# Patient Record
Sex: Female | Born: 1985 | Hispanic: Yes | Marital: Married | Smoking: Never smoker
Health system: Southern US, Community
[De-identification: ages and names within clinical notes are randomized; demographics above are authoritative.]

## PROBLEM LIST (undated history)

## (undated) DIAGNOSIS — R55 Syncope and collapse: Secondary | ICD-10-CM

## (undated) DIAGNOSIS — O24419 Gestational diabetes mellitus in pregnancy, unspecified control: Secondary | ICD-10-CM

## (undated) DIAGNOSIS — E119 Type 2 diabetes mellitus without complications: Secondary | ICD-10-CM

## (undated) DIAGNOSIS — R030 Elevated blood-pressure reading, without diagnosis of hypertension: Secondary | ICD-10-CM

## (undated) DIAGNOSIS — I10 Essential (primary) hypertension: Secondary | ICD-10-CM

## (undated) DIAGNOSIS — J302 Other seasonal allergic rhinitis: Secondary | ICD-10-CM

## (undated) DIAGNOSIS — M069 Rheumatoid arthritis, unspecified: Secondary | ICD-10-CM

## (undated) DIAGNOSIS — R7303 Prediabetes: Secondary | ICD-10-CM

## (undated) DIAGNOSIS — IMO0001 Reserved for inherently not codable concepts without codable children: Secondary | ICD-10-CM

## (undated) HISTORY — PX: TOOTH EXTRACTION: SUR596

## (undated) HISTORY — DX: Reserved for inherently not codable concepts without codable children: IMO0001

## (undated) HISTORY — DX: Elevated blood-pressure reading, without diagnosis of hypertension: R03.0

## (undated) HISTORY — PX: CYSTECTOMY: SUR359

## (undated) HISTORY — DX: Rheumatoid arthritis, unspecified: M06.9

## (undated) HISTORY — DX: Prediabetes: R73.03

---

## 2009-10-22 ENCOUNTER — Emergency Department (HOSPITAL_COMMUNITY): Admission: EM | Admit: 2009-10-22 | Discharge: 2009-10-22 | Payer: Self-pay | Admitting: Emergency Medicine

## 2010-01-18 ENCOUNTER — Emergency Department (HOSPITAL_COMMUNITY): Admission: EM | Admit: 2010-01-18 | Discharge: 2010-01-18 | Payer: Self-pay | Admitting: Emergency Medicine

## 2010-09-14 NOTE — L&D Delivery Note (Signed)
Cesarean Section Procedure Note  Indications: failure to progress: arrest of descent  Pre-operative Diagnosis: 38 week 5 day pregnancy.  Post-operative Diagnosis: same  Surgeon: Oliver Pila   Anesthesia: Epidural anesthesia   Procedure Details   The patient concurred with the proposed plan, giving informed consent.  The site of surgery properly noted/marked. The patient was taken to Operating Room #1, identified as Kelly Gregory and the procedure verified as C-Section Delivery. A Time Out was held and the above information confirmed.  After induction of anesthesia, the patient was draped and prepped in the usual sterile manner. A Pfannenstiel incision was made and carried down through the subcutaneous tissue to the fascia. Fascial incision was made and extended transversely. The fascia was separated from the underlying rectus tissue superiorly and inferiorly. The peritoneum was identified and entered. Peritoneal incision was extended longitudinally. The utero-vesical peritoneal reflection was incised transversely and the bladder flap was bluntly freed from the lower uterine segment. A low transverse uterine incision was made and the cavity entered bluntly. Delivered from OP/vtx presentation was a 7lb 7oz female infant with Apgar scores of 8 at one minute and 9 at five minutes. After the umbilical cord was clamped and cut cord blood was obtained for evaluation. The placenta was removed intact and appeared normal. The uterine outline, tubes and ovaries appeared normal. The uterine incision was closed with running locked sutures of 1-0 chromicl. A second layer of the same suture was performed in an imbricating fashion. Hemostasis was observed. Lavage was carried out until clear. The fascia was then reapproximated with running sutures of 0 Vicryl. The subcutaneous tissue was reapproximated with 3-0 plain in a running fashion.The skin was reapproximated with a subcuticular stitich of 4-0 vicryl on a  Keith needle..  Instrument, sponge, and needle counts were correct prior the abdominal closure and at the conclusion of the case.   Findings: Viable female Apgars 8,9 weight 7#7oz Normal uterus tubes and ovaries  Estimated Blood Loss:  700cc         Drains  foley         Total IV Fluids:         Specimens:Placenta                Complications:  None; patient tolerated the procedure well.         Disposition: PACU - hemodynamically stable.         Condition: stable  Attending Attestation: I was present and scrubbed for the entire procedure.

## 2010-10-06 LAB — TYPE AND SCREEN: Antibody Screen: NEGATIVE

## 2010-10-06 LAB — RPR: RPR: NONREACTIVE

## 2010-10-06 LAB — ABO/RH: RH Type: POSITIVE

## 2010-10-06 LAB — HEPATITIS B SURFACE ANTIGEN: Hepatitis B Surface Ag: NEGATIVE

## 2011-01-21 LAB — RPR: RPR: NONREACTIVE

## 2011-02-11 ENCOUNTER — Encounter: Payer: BC Managed Care – PPO | Attending: Obstetrics and Gynecology

## 2011-02-11 DIAGNOSIS — O99810 Abnormal glucose complicating pregnancy: Secondary | ICD-10-CM | POA: Insufficient documentation

## 2011-02-11 DIAGNOSIS — Z713 Dietary counseling and surveillance: Secondary | ICD-10-CM | POA: Insufficient documentation

## 2011-03-31 ENCOUNTER — Inpatient Hospital Stay (HOSPITAL_COMMUNITY): Payer: BC Managed Care – PPO | Admitting: Anesthesiology

## 2011-03-31 ENCOUNTER — Encounter (HOSPITAL_COMMUNITY): Payer: Self-pay | Admitting: Anesthesiology

## 2011-03-31 ENCOUNTER — Inpatient Hospital Stay (HOSPITAL_COMMUNITY)
Admission: AD | Admit: 2011-03-31 | Discharge: 2011-04-03 | DRG: 651 | Disposition: A | Discharge status: Home / Self Care | Payer: BC Managed Care – PPO | Source: Ambulatory Visit | Attending: Obstetrics and Gynecology | Admitting: Obstetrics and Gynecology

## 2011-03-31 ENCOUNTER — Encounter (HOSPITAL_COMMUNITY): Payer: Self-pay

## 2011-03-31 DIAGNOSIS — O139 Gestational [pregnancy-induced] hypertension without significant proteinuria, unspecified trimester: Principal | ICD-10-CM | POA: Diagnosis present

## 2011-03-31 DIAGNOSIS — O324XX0 Maternal care for high head at term, not applicable or unspecified: Secondary | ICD-10-CM | POA: Diagnosis not present

## 2011-03-31 HISTORY — DX: Gestational diabetes mellitus in pregnancy, unspecified control: O24.419

## 2011-03-31 HISTORY — DX: Syncope and collapse: R55

## 2011-03-31 LAB — CBC
Hemoglobin: 12.2 g/dL (ref 12.0–15.0)
MCH: 26 pg (ref 26.0–34.0)
MCHC: 33.1 g/dL (ref 30.0–36.0)
MCV: 78.5 fL (ref 78.0–100.0)
Platelets: 236 10*3/uL (ref 150–400)
RBC: 4.7 MIL/uL (ref 3.87–5.11)

## 2011-03-31 LAB — LACTATE DEHYDROGENASE: LDH: 166 U/L (ref 94–250)

## 2011-03-31 LAB — COMPREHENSIVE METABOLIC PANEL
ALT: 15 U/L (ref 0–35)
Alkaline Phosphatase: 347 U/L — ABNORMAL HIGH (ref 39–117)
CO2: 18 mEq/L — ABNORMAL LOW (ref 19–32)
Calcium: 8.4 mg/dL (ref 8.4–10.5)
Glucose, Bld: 93 mg/dL (ref 70–99)
Potassium: 3.9 mEq/L (ref 3.5–5.1)
Sodium: 134 mEq/L — ABNORMAL LOW (ref 135–145)
Total Bilirubin: 0.1 mg/dL — ABNORMAL LOW (ref 0.3–1.2)

## 2011-03-31 LAB — URINALYSIS, ROUTINE W REFLEX MICROSCOPIC
Bilirubin Urine: NEGATIVE
Leukocytes, UA: NEGATIVE
Nitrite: NEGATIVE
Protein, ur: 30 mg/dL — AB
Specific Gravity, Urine: 1.015 (ref 1.005–1.030)
Urobilinogen, UA: 0.2 mg/dL (ref 0.0–1.0)

## 2011-03-31 LAB — RPR: RPR Ser Ql: NONREACTIVE

## 2011-03-31 LAB — URINE MICROSCOPIC-ADD ON

## 2011-03-31 LAB — URIC ACID: Uric Acid, Serum: 4.2 mg/dL (ref 2.4–7.0)

## 2011-03-31 MED ORDER — LACTATED RINGERS IV SOLN
500.0000 mL | INTRAVENOUS | Status: DC | PRN
  Administered 2011-03-31: 1000 mL via INTRAVENOUS

## 2011-03-31 MED ORDER — IBUPROFEN 600 MG PO TABS: 600.0000 mg | ORAL_TABLET | Freq: Four times a day (QID) | ORAL | Status: DC | PRN

## 2011-03-31 MED ORDER — OXYTOCIN 20 UNITS IN LACTATED RINGERS INFUSION - SIMPLE: 125.0000 mL/h | Freq: Once | INTRAVENOUS | Status: DC

## 2011-03-31 MED ORDER — LIDOCAINE HCL (PF) 1 % IJ SOLN
30.0000 mL | INTRAMUSCULAR | Status: DC | PRN
  Filled 2011-03-31: qty 30

## 2011-03-31 MED ORDER — LACTATED RINGERS IV SOLN
INTRAVENOUS | Status: DC
  Administered 2011-03-31 – 2011-04-01 (×2): via INTRAVENOUS

## 2011-03-31 MED ORDER — EPHEDRINE 5 MG/ML INJ
10.0000 mg | INTRAVENOUS | Status: DC | PRN
  Filled 2011-03-31 (×2): qty 4

## 2011-03-31 MED ORDER — PHENYLEPHRINE 40 MCG/ML (10ML) SYRINGE FOR IV PUSH (FOR BLOOD PRESSURE SUPPORT): 80.0000 ug | PREFILLED_SYRINGE | INTRAVENOUS | Status: DC | PRN

## 2011-03-31 MED ORDER — LACTATED RINGERS IV SOLN
INTRAVENOUS | Status: DC
  Administered 2011-03-31 – 2011-04-01 (×5): via INTRAVENOUS

## 2011-03-31 MED ORDER — NALBUPHINE HCL 10 MG/ML IJ SOLN: 10.0000 mg | Freq: Four times a day (QID) | INTRAMUSCULAR | Status: DC | PRN

## 2011-03-31 MED ORDER — PHENYLEPHRINE 40 MCG/ML (10ML) SYRINGE FOR IV PUSH (FOR BLOOD PRESSURE SUPPORT)
80.0000 ug | PREFILLED_SYRINGE | INTRAVENOUS | Status: DC | PRN
  Filled 2011-03-31 (×2): qty 5

## 2011-03-31 MED ORDER — OXYTOCIN 20 UNITS IN LACTATED RINGERS INFUSION - SIMPLE: 1.0000 m[IU]/min | INTRAVENOUS | Status: DC

## 2011-03-31 MED ORDER — TERBUTALINE SULFATE 1 MG/ML IJ SOLN: 0.2500 mg | Freq: Once | INTRAMUSCULAR | Status: AC | PRN

## 2011-03-31 MED ORDER — FENTANYL 2.5 MCG/ML BUPIVACAINE 1/10 % EPIDURAL INFUSION (WH - ANES)
INTRAMUSCULAR | Status: DC | PRN
  Administered 2011-03-31: 14 mL/h via EPIDURAL

## 2011-03-31 MED ORDER — OXYTOCIN 20 UNITS IN LACTATED RINGERS INFUSION - SIMPLE
1.0000 m[IU]/min | INTRAVENOUS | Status: DC
  Administered 2011-03-31: 1 m[IU]/min via INTRAVENOUS
  Filled 2011-03-31: qty 1000

## 2011-03-31 MED ORDER — EPHEDRINE 5 MG/ML INJ: 10.0000 mg | INTRAVENOUS | Status: DC | PRN

## 2011-03-31 MED ORDER — DIPHENHYDRAMINE HCL 50 MG/ML IJ SOLN: 12.5000 mg | INTRAMUSCULAR | Status: DC | PRN

## 2011-03-31 MED ORDER — OXYCODONE-ACETAMINOPHEN 5-325 MG PO TABS: 2.0000 | ORAL_TABLET | ORAL | Status: DC | PRN

## 2011-03-31 MED ORDER — FENTANYL 2.5 MCG/ML BUPIVACAINE 1/10 % EPIDURAL INFUSION (WH - ANES)
14.0000 mL/h | INTRAMUSCULAR | Status: DC
  Administered 2011-04-01: 14 mL/h via EPIDURAL
  Filled 2011-03-31 (×3): qty 60

## 2011-03-31 MED ORDER — ONDANSETRON HCL 4 MG/2ML IJ SOLN: 4.0000 mg | Freq: Four times a day (QID) | INTRAMUSCULAR | Status: DC | PRN

## 2011-03-31 MED ORDER — LACTATED RINGERS IV SOLN
500.0000 mL | Freq: Once | INTRAVENOUS | Status: AC
  Administered 2011-04-01: 1000 mL via INTRAVENOUS

## 2011-03-31 MED ORDER — ACETAMINOPHEN 325 MG PO TABS: 650.0000 mg | ORAL_TABLET | ORAL | Status: DC | PRN

## 2011-03-31 MED ORDER — FLEET ENEMA 7-19 GM/118ML RE ENEM: 1.0000 | ENEMA | RECTAL | Status: DC | PRN

## 2011-03-31 MED ORDER — CITRIC ACID-SODIUM CITRATE 334-500 MG/5ML PO SOLN
30.0000 mL | ORAL | Status: DC | PRN
  Administered 2011-04-01: 30 mL via ORAL
  Filled 2011-03-31: qty 30

## 2011-03-31 NOTE — Progress Notes (Signed)
Gwenn Teodoro is a 25 y.o. G1P0 at [redacted]w[redacted]d with increased BP and latent phase labor Subjective:   Objective: BP 140/81  Pulse 85  Temp(Src) 97.1 F (36.2 C) (Axillary)  Resp 20  Ht 5' 5.5" (1.664 m)  Wt 82.101 kg (181 lb)  BMI 29.66 kg/m2  SpO2 97%      FHT:  Reassuring, good variabililty and some accels UC:   irregular, every 5-7 minutes SVE: 70/5/-2 AROM slightly bloody fluid Labs: Assessment / Plan: Protracted latent phase  Labor:  AROM performed and will increase pitocin as needed Preeclampsia:  Pt with increased BP but labs WNL, no proteinuria, and no sx Fetal Wellbeing:  Category I Pain Control:  Plans epidural Jayme Mednick W 03/31/2011, 7:11 PM

## 2011-03-31 NOTE — H&P (Signed)
Kelly Gregory is a 25 y.o. female G2P0010 at 39 4/7 wks  EDD 04/10/11 by LMP c/w early [place]  presenting for elevated BP and latent phase labor.  Pt seen in office with  BP noted to be 150/90-100 and ctx q 5 mins, no LOF--cervix 50/3-4/-2.  Sent to MAU/L&D for admission and probable augmentation of labor.  Prenatal care significant for GDM. BS well-controlled on glyburide 2.5mg  po q hs.  NST's since 32 weeks, twice weekly have been reactive.  She denies HA, or other PIH symptoms and has no proteinuria. Maternal Medical History:  Reason for admission: Reason for admission: contractions.    OB History    Grav Para Term Preterm Abortions TAB SAB Ect Mult Living   1              Past Medical History  Diagnosis Date  . Syncope   . Arthritis   . Gestational diabetes    Past Surgical History  Procedure Date  . Tooth extraction   . Cystectomy    Family History iin prenatal Social History:  reports that she has never smoked. She does not have any smokeless tobacco history on file. She reports that she does not drink alcohol or use illicit drugs.  ROS negative    Blood pressure 140/103, pulse 97, temperature 97.2 F (36.2 C), temperature source Oral, resp. rate 20, height 5' 5.5" (1.664 m), weight 82.101 kg (181 lb), SpO2 97.00%. Exam CV RRR  Lungs CTA bilaterally Abd gravid NT Cervix now 60/4-5/-2 BBOW Physical Exam  Prenatal labs: ABO, Rh:  A positive Antibody:  negative  Rubella:  Immune  RPR:   NR HBsAg:   Neg HIV:   NR GBS:   negative First Trimester Screen and AFP WNL One hour and 3 hour both abnormal  Assessment/Plan: Pt in MAU waiting on L&D bed.  Will check PIH labs and confirm WNL, but suspect PIH not preeclampsia. Pt making slow cervical change, when gets L&D bed, plan AROM and pitocin augmentation. BP still 140/100-150/100, will follow closely.   Oliver Pila 03/31/2011, 1:42 PM

## 2011-03-31 NOTE — Anesthesia Preprocedure Evaluation (Signed)
Anesthesia Evaluation  Name, MR# and DOB Patient awake  General Assessment Comment  Reviewed: Allergy & Precautions, H&P , Patient's Chart, lab work & pertinent test results and reviewed documented beta blocker date and time   History of Anesthesia Complications (+) MALIGNANT HYPERTHERMIA  Airway Mallampati: I TM Distance: >3 FB Neck ROM: full    Dental  (+) Teeth Intact   Pulmonaryneg pulmonary ROS    clear to auscultation    Cardiovascular regular Normal   Neuro/PsychNegative Neurological ROS Negative Psych ROS  GI/Hepatic/Renal negative GI ROS, negative Liver ROS, and negative Renal ROS (+)       Endo/Other  Negative Endocrine ROS (+)   Abdominal   Musculoskeletal  Hematology negative hematology ROS (+)   Peds  Reproductive/Obstetrics (+) Pregnancy   Anesthesia Other Findings             Anesthesia Physical Anesthesia Plan  ASA: II  Anesthesia Plan: Epidural   Post-op Pain Management:    Induction:   Airway Management Planned:   Additional Equipment:   Intra-op Plan:   Post-operative Plan:   Informed Consent: I have reviewed the patients History and Physical, chart, labs and discussed the procedure including the risks, benefits and alternatives for the proposed anesthesia with the patient or authorized representative who has indicated his/her understanding and acceptance.     Plan Discussed with:   Anesthesia Plan Comments:         Anesthesia Quick Evaluation

## 2011-03-31 NOTE — Anesthesia Procedure Notes (Addendum)
Epidural Patient location during procedure: OB Start time: 03/31/2011 8:47 PM End time: 03/31/2011 8:59 PM  Staffing Anesthesiologist: Sandrea Hughs  Preanesthetic Checklist Completed: patient identified, site marked, surgical consent, pre-op evaluation, timeout performed, IV checked, risks and benefits discussed and monitors and equipment checked  Epidural Patient position: sitting Prep: DuraPrep Patient monitoring: continuous pulse ox and blood pressure Approach: midline Injection technique: LOR air  Needle:  Needle type: Tuohy  Needle gauge: 17 G Needle length: 9 cm Needle insertion depth: 6 cm Catheter type: closed end flexible Catheter size: 19 Gauge Catheter at skin depth: 10 and 11 cm Test dose: negative and 1.5% lidocaine  Assessment Sensory level: T7 Events: blood not aspirated, injection not painful, no injection resistance, negative IV test and no paresthesia  Additional Notes Pts legs warm. See nursing notes for VS and FHR

## 2011-03-31 NOTE — Progress Notes (Signed)
   Subjective: Pt comfortable with epidural.   Objective: BP 126/76  Pulse 93  Temp(Src) 97.1 F (36.2 C) (Axillary)  Resp 20  Ht 5' 5.5" (1.664 m)  Wt 82.101 kg (181 lb)  BMI 29.66 kg/m2  SpO2 97%      FHT:  FHR: 145 bpm, variability: moderate,  accelerations:  Present,  decelerations:  Absent UC:   regular, every 5 minutes SVE:   60 /5+ /-1  IUPC placed to follow ctx closely and adjust pitocin Labs: Lab Results  Component Value Date   WBC 8.7 03/31/2011   HGB 12.2 03/31/2011   HCT 36.9 03/31/2011   MCV 78.5 03/31/2011   PLT 236 03/31/2011    Assessment / Plan: Augmentation of labor, progressing well  Labor: Will increase pitocin to achieve adequate labor Preeclampsia:  c/w PIH but not preeclampsia Fetal Wellbeing:  Category I Pain Control:  Epidural Shariq Puig W 03/31/2011, 9:36 PM

## 2011-03-31 NOTE — Progress Notes (Signed)
Pt was seen in the office today for evaluation of contractions. Pt had elevated BP and sent for direct admit. Was 3.5 cm in the office. Having contractions q 5-6 minutes.

## 2011-04-01 ENCOUNTER — Encounter (HOSPITAL_COMMUNITY)
Admission: AD | Disposition: A | Discharge status: Home / Self Care | Payer: Self-pay | Source: Ambulatory Visit | Attending: Obstetrics and Gynecology

## 2011-04-01 ENCOUNTER — Encounter (HOSPITAL_COMMUNITY): Payer: Self-pay | Admitting: Anesthesiology

## 2011-04-01 ENCOUNTER — Encounter (HOSPITAL_COMMUNITY): Payer: Self-pay | Admitting: *Deleted

## 2011-04-01 SURGERY — Surgical Case
Anesthesia: Regional

## 2011-04-01 MED ORDER — SENNOSIDES-DOCUSATE SODIUM 8.6-50 MG PO TABS
1.0000 | ORAL_TABLET | Freq: Every day | ORAL | Status: DC
  Administered 2011-04-01: 2 via ORAL
  Administered 2011-04-02: 1 via ORAL

## 2011-04-01 MED ORDER — WITCH HAZEL-GLYCERIN EX PADS: MEDICATED_PAD | CUTANEOUS | Status: DC | PRN

## 2011-04-01 MED ORDER — KETOROLAC TROMETHAMINE 30 MG/ML IJ SOLN: 30.0000 mg | Freq: Four times a day (QID) | INTRAMUSCULAR | Status: AC | PRN

## 2011-04-01 MED ORDER — MEPERIDINE HCL 25 MG/ML IJ SOLN
6.2500 mg | INTRAMUSCULAR | Status: DC | PRN
  Administered 2011-04-01: 12.5 mg via INTRAVENOUS

## 2011-04-01 MED ORDER — GENTAMICIN IN SALINE 1-0.9 MG/ML-% IV SOLN: 100.0000 mg | Freq: Once | INTRAVENOUS | Status: DC

## 2011-04-01 MED ORDER — DIPHENHYDRAMINE HCL 50 MG/ML IJ SOLN: 12.5000 mg | INTRAMUSCULAR | Status: DC | PRN

## 2011-04-01 MED ORDER — SODIUM CHLORIDE 0.9 % IJ SOLN: 3.0000 mL | INTRAMUSCULAR | Status: DC | PRN

## 2011-04-01 MED ORDER — KETOROLAC TROMETHAMINE 60 MG/2ML IM SOLN
60.0000 mg | Freq: Once | INTRAMUSCULAR | Status: AC
  Administered 2011-04-01: 60 mg via INTRAMUSCULAR

## 2011-04-01 MED ORDER — GENTAMICIN IN SALINE 1.6-0.9 MG/ML-% IV SOLN
INTRAVENOUS | Status: DC | PRN
  Administered 2011-04-01: 100 mg via INTRAVENOUS

## 2011-04-01 MED ORDER — KETOROLAC TROMETHAMINE 30 MG/ML IJ SOLN: 15.0000 mg | Freq: Once | INTRAMUSCULAR | Status: DC | PRN

## 2011-04-01 MED ORDER — PRENATAL PLUS 27-1 MG PO TABS
1.0000 | ORAL_TABLET | Freq: Every day | ORAL | Status: DC
  Administered 2011-04-01 – 2011-04-02 (×2): 1 via ORAL
  Filled 2011-04-01 (×2): qty 1

## 2011-04-01 MED ORDER — METOCLOPRAMIDE HCL 5 MG/ML IJ SOLN
INTRAMUSCULAR | Status: AC
  Administered 2011-04-01: 10 mg via INTRAVENOUS
  Filled 2011-04-01: qty 2

## 2011-04-01 MED ORDER — DIPHENHYDRAMINE HCL 50 MG/ML IJ SOLN: 25.0000 mg | INTRAMUSCULAR | Status: DC | PRN

## 2011-04-01 MED ORDER — SCOPOLAMINE 1 MG/3DAYS TD PT72
MEDICATED_PATCH | TRANSDERMAL | Status: AC
  Administered 2011-04-01: 1.5 mg via TRANSDERMAL
  Filled 2011-04-01: qty 1

## 2011-04-01 MED ORDER — IBUPROFEN 600 MG PO TABS
600.0000 mg | ORAL_TABLET | Freq: Four times a day (QID) | ORAL | Status: DC
  Administered 2011-04-01 – 2011-04-03 (×8): 600 mg via ORAL
  Filled 2011-04-01 (×8): qty 1

## 2011-04-01 MED ORDER — OXYTOCIN 20 UNITS IN LACTATED RINGERS INFUSION - SIMPLE
125.0000 mL/h | INTRAVENOUS | Status: AC
Start: 2011-04-01 — End: 2011-04-01

## 2011-04-01 MED ORDER — HYDROMORPHONE HCL 1 MG/ML IJ SOLN: 0.2500 mg | INTRAMUSCULAR | Status: DC | PRN

## 2011-04-01 MED ORDER — ONDANSETRON HCL 4 MG/2ML IJ SOLN
INTRAMUSCULAR | Status: DC | PRN
  Administered 2011-04-01: 4 mg via INTRAVENOUS

## 2011-04-01 MED ORDER — SIMETHICONE 80 MG PO CHEW: 80.0000 mg | CHEWABLE_TABLET | ORAL | Status: DC | PRN

## 2011-04-01 MED ORDER — PHENYLEPHRINE 40 MCG/ML (10ML) SYRINGE FOR IV PUSH (FOR BLOOD PRESSURE SUPPORT)
PREFILLED_SYRINGE | INTRAVENOUS | Status: AC
  Filled 2011-04-01: qty 5

## 2011-04-01 MED ORDER — MEPERIDINE HCL 25 MG/ML IJ SOLN
INTRAMUSCULAR | Status: AC
  Administered 2011-04-01: 12.5 mg via INTRAVENOUS
  Filled 2011-04-01: qty 1

## 2011-04-01 MED ORDER — ONDANSETRON HCL 4 MG/2ML IJ SOLN
INTRAMUSCULAR | Status: AC
  Filled 2011-04-01: qty 2

## 2011-04-01 MED ORDER — NALOXONE HCL 0.4 MG/ML IJ SOLN: 0.4000 mg | INTRAMUSCULAR | Status: DC | PRN

## 2011-04-01 MED ORDER — NALBUPHINE HCL 10 MG/ML IJ SOLN
5.0000 mg | INTRAMUSCULAR | Status: AC | PRN
  Filled 2011-04-01: qty 1

## 2011-04-01 MED ORDER — OXYTOCIN 10 UNIT/ML IJ SOLN
INTRAMUSCULAR | Status: AC
  Filled 2011-04-01: qty 4

## 2011-04-01 MED ORDER — PHENYLEPHRINE HCL 10 MG/ML IJ SOLN
INTRAMUSCULAR | Status: DC | PRN
  Administered 2011-04-01 (×3): 80 ug via INTRAVENOUS

## 2011-04-01 MED ORDER — OXYTOCIN 10 UNIT/ML IJ SOLN
INTRAMUSCULAR | Status: DC | PRN
  Administered 2011-04-01 (×2): 20 [IU] via INTRAMUSCULAR

## 2011-04-01 MED ORDER — DIPHENHYDRAMINE HCL 25 MG PO CAPS: 25.0000 mg | ORAL_CAPSULE | Freq: Four times a day (QID) | ORAL | Status: DC | PRN

## 2011-04-01 MED ORDER — SCOPOLAMINE 1 MG/3DAYS TD PT72
1.0000 | MEDICATED_PATCH | TRANSDERMAL | Status: DC
  Administered 2011-04-01: 1.5 mg via TRANSDERMAL

## 2011-04-01 MED ORDER — TETANUS-DIPHTH-ACELL PERTUSSIS 5-2.5-18.5 LF-MCG/0.5 IM SUSP
0.5000 mL | Freq: Once | INTRAMUSCULAR | Status: AC
  Administered 2011-04-02: 0.5 mL via INTRAMUSCULAR
  Filled 2011-04-01: qty 0.5

## 2011-04-01 MED ORDER — SODIUM CHLORIDE 0.9 % IV SOLN
1.0000 ug/kg/h | INTRAVENOUS | Status: DC | PRN
  Filled 2011-04-01: qty 2.5

## 2011-04-01 MED ORDER — GENTAMICIN SULFATE 40 MG/ML IJ SOLN
Freq: Once | INTRAMUSCULAR | Status: DC
  Filled 2011-04-01: qty 2.5

## 2011-04-01 MED ORDER — ONDANSETRON HCL 4 MG/2ML IJ SOLN
4.0000 mg | INTRAMUSCULAR | Status: DC | PRN
  Administered 2011-04-01: 4 mg via INTRAVENOUS
  Filled 2011-04-01: qty 2

## 2011-04-01 MED ORDER — CLINDAMYCIN PHOSPHATE 600 MG/50ML IV SOLN
INTRAVENOUS | Status: DC | PRN
  Administered 2011-04-01: 900 mg via INTRAVENOUS

## 2011-04-01 MED ORDER — ONDANSETRON HCL 4 MG PO TABS: 4.0000 mg | ORAL_TABLET | ORAL | Status: DC | PRN

## 2011-04-01 MED ORDER — LIDOCAINE-EPINEPHRINE (PF) 2 %-1:200000 IJ SOLN
INTRAMUSCULAR | Status: AC
  Filled 2011-04-01: qty 20

## 2011-04-01 MED ORDER — METOCLOPRAMIDE HCL 5 MG/ML IJ SOLN
10.0000 mg | Freq: Once | INTRAMUSCULAR | Status: AC | PRN
  Administered 2011-04-01: 10 mg via INTRAVENOUS

## 2011-04-01 MED ORDER — OXYCODONE-ACETAMINOPHEN 5-325 MG PO TABS
1.0000 | ORAL_TABLET | ORAL | Status: DC | PRN
  Administered 2011-04-02 – 2011-04-03 (×2): 1 via ORAL
  Filled 2011-04-01 (×2): qty 1

## 2011-04-01 MED ORDER — SIMETHICONE 80 MG PO CHEW
80.0000 mg | CHEWABLE_TABLET | Freq: Three times a day (TID) | ORAL | Status: DC
  Administered 2011-04-01 – 2011-04-02 (×7): 80 mg via ORAL

## 2011-04-01 MED ORDER — ONDANSETRON HCL 4 MG/2ML IJ SOLN: 4.0000 mg | Freq: Once | INTRAMUSCULAR | Status: DC | PRN

## 2011-04-01 MED ORDER — IBUPROFEN 200 MG PO TABS: 200.0000 mg | ORAL_TABLET | Freq: Four times a day (QID) | ORAL | Status: DC | PRN

## 2011-04-01 MED ORDER — CLINDAMYCIN PHOSPHATE 900 MG/50ML IV SOLN: 900.0000 mg | Freq: Once | INTRAVENOUS | Status: DC

## 2011-04-01 MED ORDER — MORPHINE SULFATE 0.5 MG/ML IJ SOLN
INTRAMUSCULAR | Status: AC
  Filled 2011-04-01: qty 10

## 2011-04-01 MED ORDER — ZOLPIDEM TARTRATE 5 MG PO TABS: 5.0000 mg | ORAL_TABLET | Freq: Every evening | ORAL | Status: DC | PRN

## 2011-04-01 MED ORDER — DIPHENHYDRAMINE HCL 25 MG PO CAPS: 25.0000 mg | ORAL_CAPSULE | ORAL | Status: DC | PRN

## 2011-04-01 MED ORDER — KETOROLAC TROMETHAMINE 60 MG/2ML IM SOLN
60.0000 mg | Freq: Once | INTRAMUSCULAR | Status: AC | PRN
  Filled 2011-04-01: qty 2

## 2011-04-01 MED ORDER — MORPHINE SULFATE (PF) 0.5 MG/ML IJ SOLN
INTRAMUSCULAR | Status: DC | PRN
  Administered 2011-04-01: 1 mg via INTRAVENOUS
  Administered 2011-04-01: 4 mg via EPIDURAL

## 2011-04-01 MED ORDER — KETOROLAC TROMETHAMINE 60 MG/2ML IM SOLN
INTRAMUSCULAR | Status: AC
  Administered 2011-04-01: 60 mg via INTRAMUSCULAR
  Filled 2011-04-01: qty 2

## 2011-04-01 MED ORDER — LIDOCAINE-EPINEPHRINE (PF) 2 %-1:200000 IJ SOLN
INTRAMUSCULAR | Status: DC | PRN
  Administered 2011-04-01: 3 mL via INTRADERMAL
  Administered 2011-04-01 (×2): 5 mL via INTRADERMAL

## 2011-04-01 MED ORDER — SODIUM BICARBONATE 8.4 % IV SOLN
INTRAVENOUS | Status: AC
  Filled 2011-04-01: qty 50

## 2011-04-01 MED ORDER — MENTHOL 3 MG MT LOZG: 1.0000 | LOZENGE | OROMUCOSAL | Status: DC | PRN

## 2011-04-01 SURGICAL SUPPLY — 29 items
CHLORAPREP W/TINT 26ML (MISCELLANEOUS) ×2 IMPLANT
CLOTH BEACON ORANGE TIMEOUT ST (SAFETY) ×2 IMPLANT
CONTAINER PREFILL 10% NBF 15ML (MISCELLANEOUS) IMPLANT
DRAPE UTILITY XL STRL (DRAPES) ×2 IMPLANT
ELECT REM PT RETURN 9FT ADLT (ELECTROSURGICAL) ×2
ELECTRODE REM PT RTRN 9FT ADLT (ELECTROSURGICAL) ×1 IMPLANT
EXTRACTOR VACUUM KIWI (MISCELLANEOUS) IMPLANT
EXTRACTOR VACUUM M CUP 4 TUBE (SUCTIONS) IMPLANT
GLOVE BIO SURGEON STRL SZ 6.5 (GLOVE) ×4 IMPLANT
GLOVE BIO SURGEON STRL SZ8 (GLOVE) ×2 IMPLANT
GOWN BRE IMP SLV AUR LG STRL (GOWN DISPOSABLE) ×4 IMPLANT
KIT ABG SYR 3ML LUER SLIP (SYRINGE) IMPLANT
NEEDLE HYPO 25X5/8 SAFETYGLIDE (NEEDLE) IMPLANT
NS IRRIG 1000ML POUR BTL (IV SOLUTION) ×2 IMPLANT
PACK C SECTION WH (CUSTOM PROCEDURE TRAY) ×2 IMPLANT
RTRCTR C-SECT PINK 25CM LRG (MISCELLANEOUS) ×2 IMPLANT
SLEEVE SCD COMPRESS KNEE MED (MISCELLANEOUS) ×2 IMPLANT
STAPLER VISISTAT 35W (STAPLE) IMPLANT
SUT CHROMIC 1 CTX 36 (SUTURE) ×4 IMPLANT
SUT PLAIN 0 NONE (SUTURE) IMPLANT
SUT PLAIN 2 0 XLH (SUTURE) IMPLANT
SUT VIC AB 0 CT1 27 (SUTURE) ×2
SUT VIC AB 0 CT1 27XBRD ANBCTR (SUTURE) ×2 IMPLANT
SUT VIC AB 2-0 CT1 27 (SUTURE)
SUT VIC AB 2-0 CT1 TAPERPNT 27 (SUTURE) IMPLANT
SUT VIC AB 4-0 KS 27 (SUTURE) IMPLANT
TOWEL OR 17X24 6PK STRL BLUE (TOWEL DISPOSABLE) ×4 IMPLANT
TRAY FOLEY CATH 14FR (SET/KITS/TRAYS/PACK) IMPLANT
WATER STERILE IRR 1000ML POUR (IV SOLUTION) ×2 IMPLANT

## 2011-04-01 NOTE — Progress Notes (Signed)
Baby was just born this morning. Mom reports that he nursed well in recovery room for 15 minutes, but has been sleepy since. Reviewed awakening techniques, No further questions at present. To page for assist prn.

## 2011-04-01 NOTE — Progress Notes (Signed)
UR chart review completed.  

## 2011-04-01 NOTE — Transfer of Care (Signed)
Immediate Anesthesia Transfer of Care Note  Patient: Kelly Gregory  Procedure(s) Performed:  CESAREAN SECTION  Patient Location: PACU  Anesthesia Type: Epidural  Level of Consciousness: awake, oriented and sedated  Airway & Oxygen Therapy: Patient Spontanous Breathing  Post-op Assessment: Report given to PACU RN and Post -op Vital signs reviewed and stable  Post vital signs: Reviewed and stable  Complications: No apparent anesthesia complications

## 2011-04-01 NOTE — Progress Notes (Signed)
   Subjective: Pt still comfortable with epidural  Objective: BP 128/84  Pulse 91  Temp(Src) 98.6 F (37 C) (Axillary)  Resp 19  Ht 5' 5.5" (1.664 m)  Wt 82.101 kg (181 lb)  BMI 29.66 kg/m2  SpO2 97%      FHT:  Great variability, decels with ctx to 100 from baseline of 130.  Pt did have a continuous decel for about 3-4 minutes to 90-100 when had back-to-back ctx, so pitocin turned off UC:   Regular every 1-2 mins SVE:  C/ 9+/ 0  Direct OP Labs: Lab Results  Component Value Date   WBC 8.7 03/31/2011   HGB 12.2 03/31/2011   HCT 36.9 03/31/2011   MCV 78.5 03/31/2011   PLT 236 03/31/2011    Assessment / Plan: Pt now at essentially 10cm, has reducible ant lip.  Station at 0, so will try to labor down a bit.  Labor: Progressing normally to 10cm, will have to see if will descend now given OP presentation.  Fetal Wellbeing:  Category II--overall reassuring with mild decels with ctx. Pain Control:  Epidural   IRICHARDSON,Daimion Adamcik W 04/01/2011, 1:30 AM

## 2011-04-01 NOTE — Progress Notes (Signed)
   Subjective: Pt still comfortable  Objective: BP 153/96  Pulse 92  Temp(Src) 98.6 F (37 C) (Axillary)  Resp 19  Ht 5' 5.5" (1.664 m)  Wt 82.101 kg (181 lb)  BMI 29.66 kg/m2  SpO2 97%      FHT: overall good variability with some decels with contractions, mild UC:   q SVE:   Dilation: 10 Effacement (%): 100 Station: -1;0 Exam by:: Dr. Senaida Ores  Labs: Lab Results  Component Value Date   WBC 8.7 03/31/2011   HGB 12.2 03/31/2011   HCT 36.9 03/31/2011   MCV 78.5 03/31/2011   PLT 236 03/31/2011    Assessment / Plan: Arrest of decent  Labor: Pt labored down about 1+ hour then attempted pushing for 30 mins with no real progress noted.  Baby remains OP and -1 to 0 station.  Options of continuing to push vs proceeding with c-section d/w pt and husband and they desire c-section.  Risks and benefits of the surgery reviewed in detail and she desires to proceed.  OR notified and prepping.  Fetal Wellbeing:  Category II Pain Control:  Epidural Jurline Folger W 04/01/2011, 2:57 AM

## 2011-04-01 NOTE — Brief Op Note (Signed)
03/31/2011 - 04/01/2011  4:22 AM  PATIENT:  Kelly Gregory  25 y.o. female  PRE-OPERATIVE DIAGNOSIS:  Term Pregnancy at 9 5/7wks                                 Failure To Descend  POST-OPERATIVE DIAGNOSIS:  Same with OP presentation  Findings :Female At 0337                    Apgar 9/9, weight 7lbs 7oz  Nml uterus tubes and ovaries  PROCEDURE:  Procedure(s): Primary Low transverse CESAREAN SECTION  SURGEON:  Surgeon(s): Oliver Pila  ANESTHESIA:   epidural  ESTIMATED BLOOD LOSS: 700cc BLOOD ADMINISTERED:none  DRAINS: none   LOCAL MEDICATIONS USED:  NONE  SPECIMEN:  Placenta DISPOSITION OF SPECIMEN: L&D COUNTS:  YES  PLAN OF CARE: routine postop care PATIENT DISPOSITION:  PACU - hemodynamically stable.

## 2011-04-01 NOTE — Progress Notes (Signed)
Subjective: Postpartum Day 0: Cesarean Delivery Patient reports tolerating PO.  Pain is controlled.  Objective: Vital signs in last 24 hours: Temp:  [97.1 F (36.2 C)-98.7 F (37.1 C)] 97.9 F (36.6 C) (07/18 0730) Pulse Rate:  [73-121] 99  (07/18 0730) Resp:  [16-22] 18  (07/18 0730) BP: (119-161)/(69-107) 134/91 mmHg (07/18 0730) SpO2:  [93 %-98 %] 93 % (07/18 0600) Weight:  [82.101 kg (181 lb)] 181 lb (82.101 kg) (07/17 1214)  Physical Exam:  General: alert Lochia: appropriate Uterine Fundus: firm Incision: clean dry and intact DVT Evaluation: No evidence of DVT seen on physical exam. UOP adequate  Basename 03/31/11 1332  HGB 12.2  HCT 36.9    Assessment/Plan: Status post Cesarean section. Doing well postoperatively.  Continue current care.  Oliver Pila 04/01/2011, 8:45 AM

## 2011-04-01 NOTE — Consult Note (Signed)
Asked to attend delivery of this infant by C/S at term for FTD. Infant was vigorous at birth. Dried. Apgars 9/9 To CN. Care to assigned Ped. Edson Snowball, MD

## 2011-04-01 NOTE — Anesthesia Postprocedure Evaluation (Signed)
Vital signs stable Patient alert Pain and nausea are controlled No apparent anesthetic complications No follow up care needed NM function returning normally

## 2011-04-02 LAB — CBC
Hemoglobin: 10.3 g/dL — ABNORMAL LOW (ref 12.0–15.0)
RBC: 4.06 MIL/uL (ref 3.87–5.11)
WBC: 12.1 10*3/uL — ABNORMAL HIGH (ref 4.0–10.5)

## 2011-04-02 NOTE — Progress Notes (Signed)
Subjective: Postpartum Day 1: Cesarean Delivery Patient reports tolerating PO, + flatus and no problems voiding, ambulating well  Objective: Vital signs in last 24 hours: Temp:  [98.1 F (36.7 C)-99.2 F (37.3 C)] 98.5 F (36.9 C) (07/19 0609) Pulse Rate:  [82-114] 82  (07/19 0609) Resp:  [16-20] 16  (07/19 0609) BP: (103-135)/(66-88) 120/81 mmHg (07/19 0609) SpO2:  [89 %-95 %] 93 % (07/19 0300)  Physical Exam:  General: alert Lochia: appropriate Uterine Fundus: firm Incision: no significant drainage DVT Evaluation: No evidence of DVT seen on physical exam.   Basename 04/02/11 0550 03/31/11 1332  HGB 10.3* 12.2  HCT 32.3* 36.9    Assessment/Plan: Status post Cesarean section. Doing well postoperatively. BP WBL.  Continue current care.  Kelly Gregory 04/02/2011, 9:18 AM

## 2011-04-02 NOTE — Progress Notes (Signed)
Mom HP prior to latch.  Hand expressions obs. Colostrum.  Waking techniques used; few attempts needed to latch.  Once latched maintained latch and remained in good rhythm with audible swallows.  Wide open mouth, flanged lips.  Basic BF teaching done.  Parents verbalized understanding.

## 2011-04-03 MED ORDER — IBUPROFEN 600 MG PO TABS: 600.0000 mg | ORAL_TABLET | Freq: Four times a day (QID) | ORAL | Status: AC

## 2011-04-03 MED ORDER — OXYCODONE-ACETAMINOPHEN 5-325 MG PO TABS: 1.0000 | ORAL_TABLET | ORAL | Status: AC | PRN

## 2011-04-03 NOTE — Progress Notes (Signed)
Mother states infant cluster fed during the night. Reviewed pre pumping and growth spurts. Parents has lots of questions. discussed phone line , outpatient visit and support group for more assistance.

## 2011-04-03 NOTE — Progress Notes (Signed)
Subjective: Postpartum Day2: Cesarean Delivery Patient reports tolerating PO, + flatus and no problems voiding.    Objective: Vital signs in last 24 hours: Temp:  [97.7 F (36.5 C)-98.5 F (36.9 C)] 97.7 F (36.5 C) (07/20 0620) Pulse Rate:  [81-99] 81  (07/20 0620) Resp:  [18] 18  (07/20 0620) BP: (122-133)/(86-90) 124/87 mmHg (07/20 1610)  Physical Exam:  General: alert Lochia: appropriate Uterine Fundus: firm Incision: healing well, steristrips fell off in shower    Basename 04/02/11 0550 03/31/11 1332  HGB 10.3* 12.2  HCT 32.3* 36.9    Assessment/Plan: Status post Cesarean section. Doing well postoperatively.  Discharge home with standard precautions and return to office in 2 weeks for incision check.  Kelly Gregory 04/03/2011, 9:37 AM

## 2011-04-03 NOTE — Discharge Summary (Signed)
Obstetric Discharge Summary Reason for Admission: induction of labor due to [facility] Prenatal Procedures: NST Intrapartum Procedures: cesarean: low cervical, transverse Postpartum Procedures: none Complications-Operative and Postpartum: none  Hemoglobin  Date Value Range Status  04/02/2011 10.3* 12.0-15.0 (g/dL) Final     HCT  Date Value Range Status  04/02/2011 32.3* 36.0-46.0 (%) Final    Discharge Diagnoses: Term Pregnancy-delivered                                         LTCS for arrest of Descent Discharge Information: Date: 04/03/2011 Activity: pelvic rest Diet: routine Medications: Ibuprophen and Percocet Condition: stable Instructions: refer to practice specific booklet Discharge to: home  Pt admitted due to [facility] for induction of labor.  Preeclampsia labs were normal and there was no proteinuria. Made good progress on pitocin to 9-10 cm, but vertex would not descend beyond a 0 station even with attempted pushing when cervix was reducible.  Baby was OP.  Pt then underwent a LTCS without complication and was  Admitted for post-op care.  Course was uneventful, baby did well and BP were stable at 130-140/80-90. Follow-up Information    Follow up with Gianina Olinde W in 2 weeks. (Call for appt)    Contact information:   [address] [place] [address] [phone]          Newborn Data: Live born  Information for the patient's newborn:  Abir, Eroh Verlia [[id number]]  female ; APGAR8,9 ; weight ; 7#7oz Home with mother.  Oliver Pila 04/03/2011, 9:42 AM

## 2011-04-07 ENCOUNTER — Encounter (HOSPITAL_COMMUNITY): Payer: BC Managed Care – PPO

## 2011-04-16 ENCOUNTER — Encounter (HOSPITAL_COMMUNITY): Payer: Self-pay | Admitting: Obstetrics and Gynecology

## 2012-05-25 ENCOUNTER — Emergency Department (INDEPENDENT_AMBULATORY_CARE_PROVIDER_SITE_OTHER)
Admission: EM | Admit: 2012-05-25 | Discharge: 2012-05-25 | Disposition: A | Discharge status: Home / Self Care | Payer: BC Managed Care – PPO | Source: Home / Self Care | Attending: Emergency Medicine | Admitting: Emergency Medicine

## 2012-05-25 ENCOUNTER — Encounter (HOSPITAL_COMMUNITY): Payer: Self-pay | Admitting: *Deleted

## 2012-05-25 DIAGNOSIS — S61209A Unspecified open wound of unspecified finger without damage to nail, initial encounter: Secondary | ICD-10-CM

## 2012-05-25 DIAGNOSIS — L0390 Cellulitis, unspecified: Secondary | ICD-10-CM

## 2012-05-25 DIAGNOSIS — Z23 Encounter for immunization: Secondary | ICD-10-CM

## 2012-05-25 DIAGNOSIS — IMO0001 Reserved for inherently not codable concepts without codable children: Secondary | ICD-10-CM

## 2012-05-25 DIAGNOSIS — T148XXA Other injury of unspecified body region, initial encounter: Secondary | ICD-10-CM

## 2012-05-25 DIAGNOSIS — S61259A Open bite of unspecified finger without damage to nail, initial encounter: Secondary | ICD-10-CM

## 2012-05-25 MED ORDER — TETANUS-DIPHTHERIA TOXOIDS TD 5-2 LFU IM INJ: 0.5000 mL | INJECTION | Freq: Once | INTRAMUSCULAR | Status: DC

## 2012-05-25 MED ORDER — CLINDAMYCIN HCL 300 MG PO CAPS: 300.0000 mg | ORAL_CAPSULE | Freq: Four times a day (QID) | ORAL | Status: AC

## 2012-05-25 MED ORDER — TETANUS-DIPHTH-ACELL PERTUSSIS 5-2.5-18.5 LF-MCG/0.5 IM SUSP
0.5000 mL | Freq: Once | INTRAMUSCULAR | Status: AC
  Administered 2012-05-25: 0.5 mL via INTRAMUSCULAR

## 2012-05-25 MED ORDER — TETANUS-DIPHTH-ACELL PERTUSSIS 5-2.5-18.5 LF-MCG/0.5 IM SUSP
INTRAMUSCULAR | Status: AC
  Filled 2012-05-25: qty 0.5

## 2012-05-25 NOTE — ED Provider Notes (Signed)
History     CSN: [id number]  Arrival date & time 05/25/12  1836   None     Chief Complaint  Patient presents with  . Animal Bite    (Consider location/radiation/quality/duration/timing/severity/associated sxs/prior treatment) The history is provided by the patient.   Kelly Gregory is a 26 y.o. female who sustained a left middle finger injury one day ago. Mechanism of injury: cat bite while trying to place in cat carrier. Immediate symptoms: immediate pain and bleeding. Symptoms have been persistent since that time. No prior history of related problems.  Pt report her personal cat, immunizations are up to date.  Past Medical History  Diagnosis Date  . Syncope   . Arthritis   . Gestational diabetes     Past Surgical History  Procedure Date  . Tooth extraction   . Cystectomy   . Cesarean section 04/01/2011    Procedure: CESAREAN SECTION;  Surgeon: Oliver Pila;  Location: [facility] ORS;  Service: Gynecology;  Laterality: N/A;    No family history on file.  History  Substance Use Topics  . Smoking status: Never Smoker   . Smokeless tobacco: Not on file  . Alcohol Use: No    OB History    Grav Para Term Preterm Abortions TAB SAB Ect Mult Living   1               Review of Systems  Constitutional: Negative.   Respiratory: Negative.   Cardiovascular: Negative.   Skin: Positive for wound. Negative for color change.    Allergies  Azithromycin; Tequin; and Penicillins  Home Medications   Current Outpatient Rx  Name Route Sig Dispense Refill  . CLINDAMYCIN HCL 300 MG PO CAPS Oral Take 1 capsule (300 mg total) by mouth 4 (four) times daily. 28 capsule 0  . FOLIC ACID PO Oral Take 1 tablet by mouth daily. Unknown strength      BP 150/93  Pulse 70  Temp 98.7 F (37.1 C) (Oral)  Resp 18  SpO2 96%  LMP 04/25/2012  Physical Exam  Nursing note and vitals reviewed. Constitutional: She is oriented to person, place, and time. Vital signs are normal. She appears  well-developed and well-nourished. She is active and cooperative.  HENT:  Head: Normocephalic.  Eyes: Conjunctivae normal are normal. Pupils are equal, round, and reactive to light. No scleral icterus.  Neck: Trachea normal. Neck supple.  Cardiovascular: Normal rate and regular rhythm.   Pulmonary/Chest: Effort normal and breath sounds normal.  Neurological: She is alert and oriented to person, place, and time. No cranial nerve deficit or sensory deficit.  Skin: Skin is warm and dry.       2 puncture wounds to left middle medial distal finger, erythema and swelling noted, no drainage, hemostatic  Psychiatric: She has a normal mood and affect. Her speech is normal and behavior is normal. Judgment and thought content normal. Cognition and memory are normal.    ED Course  Procedures (including critical care time)  Labs Reviewed - No data to display No results found.   1. Cat bite of finger   2. Cellulitis       MDM  Patient started on clindamycin only related to amoxicillin and tequin allergy.  Wound recheck in 2 days to ensure improvement.          Johnsie Kindred, NP 05/25/12 2147

## 2012-05-25 NOTE — ED Notes (Signed)
Pt  Reports  She  Sustained  A  Cat  Bite  From  Her  House  Cat  yest     Which  Has  Been  Vaccinated    -  The  Bite  Was  Provoked        2  Small  Puncture  wounds  Are  Present

## 2012-05-27 ENCOUNTER — Encounter (HOSPITAL_COMMUNITY): Payer: Self-pay | Admitting: Emergency Medicine

## 2012-05-27 ENCOUNTER — Emergency Department (HOSPITAL_COMMUNITY)
Admission: EM | Admit: 2012-05-27 | Discharge: 2012-05-27 | Disposition: A | Discharge status: Home / Self Care | Payer: BC Managed Care – PPO | Source: Home / Self Care

## 2012-05-27 DIAGNOSIS — IMO0001 Reserved for inherently not codable concepts without codable children: Secondary | ICD-10-CM

## 2012-05-27 DIAGNOSIS — L0390 Cellulitis, unspecified: Secondary | ICD-10-CM

## 2012-05-27 NOTE — ED Notes (Signed)
Pt is here for a f/u for a cat bite on her left hand... Was here on Wednesday for the same reason... States that her cat, who is vaccinated, bit her when she was trying to place her in the "cat carrier"... Pt says the pain has gotten better but is concerned about the puss that's building up on her finger... Finger is a little tender but denies fever, vomiting, nausea and diarrhea.... Has not finished her antibiotics yet.

## 2012-05-27 NOTE — ED Provider Notes (Signed)
History     CSN: [id number]  Arrival date & time 05/27/12  5284   None     Chief Complaint  Patient presents with  . Animal Bite    (Consider location/radiation/quality/duration/timing/severity/associated sxs/prior treatment) HPI Comments: Wound chk of cat bite to L middle finger. Pt st is looking much better, redness and swelling much reduced and feelingbetter. A small 4mm superficial pustule remains on middle phalynx.   Patient is a 26 y.o. female presenting with animal bite.  Animal Bite     Past Medical History  Diagnosis Date  . Syncope   . Arthritis   . Gestational diabetes     Past Surgical History  Procedure Date  . Tooth extraction   . Cystectomy   . Cesarean section 04/01/2011    Procedure: CESAREAN SECTION;  Surgeon: Oliver Pila;  Location: [facility];  Service: Gynecology;  Laterality: N/A;    History reviewed. No pertinent family history.  History  Substance Use Topics  . Smoking status: Never Smoker   . Smokeless tobacco: Not on file  . Alcohol Use: No    OB History    Grav Para Term Preterm Abortions TAB SAB Ect Mult Living   1               Review of Systems  Constitutional: Negative.   Musculoskeletal: Negative.   Skin:       See HPI  Neurological: Negative.     Allergies  Azithromycin; Tequin; and Penicillins  Home Medications   Current Outpatient Rx  Name Route Sig Dispense Refill  . CLINDAMYCIN HCL 300 MG PO CAPS Oral Take 1 capsule (300 mg total) by mouth 4 (four) times daily. 28 capsule 0  . FOLIC ACID PO Oral Take 1 tablet by mouth daily. Unknown strength      BP 139/99  Pulse 89  Temp 98.6 F (37 C) (Oral)  Resp 16  SpO2 98%  LMP 04/25/2012  Physical Exam  Constitutional: She is oriented to person, place, and time. She appears well-developed and well-nourished.  Musculoskeletal: Normal range of motion.  Neurological: She is alert and oriented to person, place, and time.  Skin: Skin is warm and dry.   Mild, improved erythema of the middle digit, Minor swelling and a 4mm pustule of middle phalynx. No circumferential redness or swelling  Psychiatric: She has a normal mood and affect.    ED Course  INCISION AND DRAINAGE Date/Time: 05/27/2012 10:28 AM Performed by: Phineas Real, Markiesha Delia Authorized by: Phineas Real, Torii Royse Consent: Verbal consent obtained. Written consent not obtained. Risks and benefits: risks, benefits and alternatives were discussed Consent given by: patient Patient identity confirmed: verbally with patient Type: abscess Body area: upper extremity Location details: left long finger Patient sedated: no Incision type: single straight Complexity: simple Drainage: purulent Drainage amount: scant Comments: The small and very superficial pustule involving the dermis only was punctured at a 30 deg angle and a depth of 3mm, using #11 blade. The purulence was expressed equalling approx 4 large drops .    (including critical care time)  Labs Reviewed - No data to display No results found.   1. Cellulitis   2. Wound check, dressing change       MDM  Cont Clindamycin Pustule I and D. Warm salt water soaks.         Hayden Rasmussen, NP 05/27/12 1035

## 2012-05-28 NOTE — ED Provider Notes (Signed)
Medical screening examination/treatment/procedure(s) were performed by resident physician or non-physician practitioner and as supervising physician I was immediately available for consultation/collaboration.   Barkley Bruns MD.    Linna Hoff, MD 05/28/12 1047

## 2012-05-30 NOTE — ED Provider Notes (Signed)
Medical screening examination/treatment/procedure(s) were performed by non-physician practitioner and as supervising physician I was immediately available for consultation/collaboration.  Kawika Bischoff M. MD   Sal Spratley M Kynzee Devinney, MD 05/30/12 0806 

## 2013-01-04 ENCOUNTER — Encounter: Payer: Self-pay | Admitting: *Deleted

## 2013-01-04 ENCOUNTER — Encounter: Payer: BC Managed Care – PPO | Attending: Obstetrics and Gynecology | Admitting: *Deleted

## 2013-01-04 VITALS — Ht <= 58 in | Wt 168.0 lb

## 2013-01-04 DIAGNOSIS — Z713 Dietary counseling and surveillance: Secondary | ICD-10-CM | POA: Insufficient documentation

## 2013-01-04 DIAGNOSIS — O99810 Abnormal glucose complicating pregnancy: Secondary | ICD-10-CM | POA: Insufficient documentation

## 2013-01-04 NOTE — Progress Notes (Signed)
  Patient was seen on 01/04/13 for Gestational Diabetes self-management class at the [facility]. The following learning objectives were met by the patient during this course:   States the definition of Gestational Diabetes  States why dietary management is important in controlling blood glucose  Describes the effects each nutrient has on blood glucose levels  Demonstrates ability to create a balanced meal plan  Demonstrates carbohydrate counting   States when to check blood glucose levels  Demonstrates proper blood glucose monitoring techniques  States the effect of stress and exercise on blood glucose levels  States the importance of limiting caffeine and abstaining from alcohol and smoking  Blood glucose monitor given:  One Touch Ultra Mini Self Monitoring Kit Lot # [id number] Exp: 4/15 Blood glucose reading: 103 mg/dl  Patient instructed to monitor glucose levels: FBS: 60 - <90 1 hour: <140   *Patient received handouts:  Nutrition Diabetes and Pregnancy  Carbohydrate Counting List  Patient will be seen for follow-up as needed.

## 2013-01-04 NOTE — Patient Instructions (Signed)
Goals:  Check glucose levels per MD as instructed  Follow Gestational Diabetes Diet as instructed  Call for follow-up as needed    

## 2013-05-16 ENCOUNTER — Encounter (HOSPITAL_COMMUNITY): Payer: Self-pay | Admitting: Pharmacist

## 2013-05-23 ENCOUNTER — Inpatient Hospital Stay (HOSPITAL_COMMUNITY)
Admission: AD | Admit: 2013-05-23 | Discharge: 2013-05-25 | DRG: 650 | Disposition: A | Discharge status: Home / Self Care | Payer: BC Managed Care – PPO | Source: Ambulatory Visit | Attending: Obstetrics and Gynecology | Admitting: Obstetrics and Gynecology

## 2013-05-23 ENCOUNTER — Inpatient Hospital Stay (HOSPITAL_COMMUNITY): Payer: BC Managed Care – PPO | Admitting: Anesthesiology

## 2013-05-23 ENCOUNTER — Encounter (HOSPITAL_COMMUNITY)
Admission: AD | Disposition: A | Discharge status: Home / Self Care | Payer: Self-pay | Source: Ambulatory Visit | Attending: Obstetrics and Gynecology

## 2013-05-23 ENCOUNTER — Encounter (HOSPITAL_COMMUNITY): Payer: Self-pay | Admitting: Anesthesiology

## 2013-05-23 ENCOUNTER — Encounter (HOSPITAL_COMMUNITY): Payer: Self-pay | Admitting: *Deleted

## 2013-05-23 DIAGNOSIS — Z794 Long term (current) use of insulin: Secondary | ICD-10-CM

## 2013-05-23 DIAGNOSIS — Z98891 History of uterine scar from previous surgery: Secondary | ICD-10-CM

## 2013-05-23 DIAGNOSIS — O34219 Maternal care for unspecified type scar from previous cesarean delivery: Secondary | ICD-10-CM | POA: Diagnosis present

## 2013-05-23 DIAGNOSIS — O99814 Abnormal glucose complicating childbirth: Principal | ICD-10-CM | POA: Diagnosis present

## 2013-05-23 DIAGNOSIS — O99892 Other specified diseases and conditions complicating childbirth: Secondary | ICD-10-CM | POA: Diagnosis present

## 2013-05-23 DIAGNOSIS — Q054 Unspecified spina bifida with hydrocephalus: Secondary | ICD-10-CM

## 2013-05-23 DIAGNOSIS — E119 Type 2 diabetes mellitus without complications: Secondary | ICD-10-CM | POA: Diagnosis present

## 2013-05-23 DIAGNOSIS — E1165 Type 2 diabetes mellitus with hyperglycemia: Secondary | ICD-10-CM | POA: Diagnosis present

## 2013-05-23 LAB — CBC
MCH: 29.1 pg (ref 26.0–34.0)
MCHC: 35.1 g/dL (ref 30.0–36.0)
MCV: 83 fL (ref 78.0–100.0)
Platelets: 211 10*3/uL (ref 150–400)
RDW: 15 % (ref 11.5–15.5)
WBC: 9.4 10*3/uL (ref 4.0–10.5)

## 2013-05-23 LAB — GLUCOSE, CAPILLARY
Glucose-Capillary: 148 mg/dL — ABNORMAL HIGH (ref 70–99)
Glucose-Capillary: 87 mg/dL (ref 70–99)
Glucose-Capillary: 93 mg/dL (ref 70–99)

## 2013-05-23 SURGERY — Surgical Case
Anesthesia: Epidural | Site: Abdomen | Wound class: Clean Contaminated

## 2013-05-23 MED ORDER — PHENYLEPHRINE HCL 10 MG/ML IJ SOLN
INTRAMUSCULAR | Status: DC | PRN
  Administered 2013-05-23: 40 ug via INTRAVENOUS
  Administered 2013-05-23 (×3): 80 ug via INTRAVENOUS

## 2013-05-23 MED ORDER — MORPHINE SULFATE 0.5 MG/ML IJ SOLN
INTRAMUSCULAR | Status: AC
  Filled 2013-05-23: qty 10

## 2013-05-23 MED ORDER — SENNOSIDES-DOCUSATE SODIUM 8.6-50 MG PO TABS
2.0000 | ORAL_TABLET | ORAL | Status: DC
Start: 2013-05-24 — End: 2013-05-25
  Administered 2013-05-23 – 2013-05-25 (×2): 2 via ORAL

## 2013-05-23 MED ORDER — MEASLES, MUMPS & RUBELLA VAC ~~LOC~~ INJ: 0.5000 mL | INJECTION | Freq: Once | SUBCUTANEOUS | Status: DC

## 2013-05-23 MED ORDER — OXYTOCIN 10 UNIT/ML IJ SOLN
INTRAMUSCULAR | Status: AC
  Filled 2013-05-23: qty 4

## 2013-05-23 MED ORDER — NALBUPHINE HCL 10 MG/ML IJ SOLN
5.0000 mg | INTRAMUSCULAR | Status: DC | PRN
  Filled 2013-05-23: qty 1

## 2013-05-23 MED ORDER — FAMOTIDINE IN NACL 20-0.9 MG/50ML-% IV SOLN
20.0000 mg | Freq: Once | INTRAVENOUS | Status: AC
  Administered 2013-05-23: 20 mg via INTRAVENOUS
  Filled 2013-05-23: qty 50

## 2013-05-23 MED ORDER — KETOROLAC TROMETHAMINE 30 MG/ML IJ SOLN: 30.0000 mg | Freq: Four times a day (QID) | INTRAMUSCULAR | Status: AC | PRN

## 2013-05-23 MED ORDER — SODIUM CHLORIDE 0.9 % IJ SOLN: 3.0000 mL | INTRAMUSCULAR | Status: DC | PRN

## 2013-05-23 MED ORDER — ONDANSETRON HCL 4 MG/2ML IJ SOLN: 4.0000 mg | Freq: Three times a day (TID) | INTRAMUSCULAR | Status: DC | PRN

## 2013-05-23 MED ORDER — ONDANSETRON HCL 4 MG/2ML IJ SOLN
INTRAMUSCULAR | Status: AC
  Filled 2013-05-23: qty 2

## 2013-05-23 MED ORDER — OXYCODONE-ACETAMINOPHEN 5-325 MG PO TABS
1.0000 | ORAL_TABLET | ORAL | Status: DC | PRN
  Administered 2013-05-24 – 2013-05-25 (×3): 1 via ORAL
  Filled 2013-05-23 (×3): qty 1

## 2013-05-23 MED ORDER — FENTANYL CITRATE 0.05 MG/ML IJ SOLN
INTRAMUSCULAR | Status: AC
  Filled 2013-05-23: qty 2

## 2013-05-23 MED ORDER — INFLUENZA VAC SPLIT QUAD 0.5 ML IM SUSP
0.5000 mL | INTRAMUSCULAR | Status: AC
  Administered 2013-05-25: 0.5 mL via INTRAMUSCULAR
  Filled 2013-05-23: qty 0.5

## 2013-05-23 MED ORDER — DIPHENHYDRAMINE HCL 50 MG/ML IJ SOLN: 25.0000 mg | INTRAMUSCULAR | Status: DC | PRN

## 2013-05-23 MED ORDER — CITRIC ACID-SODIUM CITRATE 334-500 MG/5ML PO SOLN
30.0000 mL | Freq: Once | ORAL | Status: AC
  Administered 2013-05-23: 30 mL via ORAL
  Filled 2013-05-23: qty 15

## 2013-05-23 MED ORDER — DIPHENHYDRAMINE HCL 25 MG PO CAPS: 25.0000 mg | ORAL_CAPSULE | ORAL | Status: DC | PRN

## 2013-05-23 MED ORDER — LANOLIN HYDROUS EX OINT: 1.0000 "application " | TOPICAL_OINTMENT | CUTANEOUS | Status: DC | PRN

## 2013-05-23 MED ORDER — WITCH HAZEL-GLYCERIN EX PADS: 1.0000 "application " | MEDICATED_PAD | CUTANEOUS | Status: DC | PRN

## 2013-05-23 MED ORDER — PHENYLEPHRINE 40 MCG/ML (10ML) SYRINGE FOR IV PUSH (FOR BLOOD PRESSURE SUPPORT)
PREFILLED_SYRINGE | INTRAVENOUS | Status: AC
  Filled 2013-05-23: qty 5

## 2013-05-23 MED ORDER — SIMETHICONE 80 MG PO CHEW
80.0000 mg | CHEWABLE_TABLET | Freq: Three times a day (TID) | ORAL | Status: DC
  Administered 2013-05-23 – 2013-05-25 (×4): 80 mg via ORAL

## 2013-05-23 MED ORDER — CEFAZOLIN SODIUM-DEXTROSE 2-3 GM-% IV SOLR
2.0000 g | Freq: Once | INTRAVENOUS | Status: AC
  Administered 2013-05-23 (×2): 2 g via INTRAVENOUS
  Filled 2013-05-23: qty 50

## 2013-05-23 MED ORDER — IBUPROFEN 600 MG PO TABS
600.0000 mg | ORAL_TABLET | Freq: Four times a day (QID) | ORAL | Status: DC
  Administered 2013-05-23 – 2013-05-25 (×6): 600 mg via ORAL
  Filled 2013-05-23 (×7): qty 1

## 2013-05-23 MED ORDER — MAGNESIUM HYDROXIDE 400 MG/5ML PO SUSP: 30.0000 mL | ORAL | Status: DC | PRN

## 2013-05-23 MED ORDER — MORPHINE SULFATE (PF) 0.5 MG/ML IJ SOLN
INTRAMUSCULAR | Status: DC | PRN
  Administered 2013-05-23: .15 mg via INTRATHECAL

## 2013-05-23 MED ORDER — KETOROLAC TROMETHAMINE 30 MG/ML IJ SOLN
30.0000 mg | Freq: Four times a day (QID) | INTRAMUSCULAR | Status: AC | PRN
  Administered 2013-05-23: 30 mg via INTRAVENOUS

## 2013-05-23 MED ORDER — 0.9 % SODIUM CHLORIDE (POUR BTL) OPTIME
TOPICAL | Status: DC | PRN
  Administered 2013-05-23: 1000 mL

## 2013-05-23 MED ORDER — DIPHENHYDRAMINE HCL 50 MG/ML IJ SOLN: 12.5000 mg | INTRAMUSCULAR | Status: DC | PRN

## 2013-05-23 MED ORDER — LACTATED RINGERS IV SOLN
INTRAVENOUS | Status: DC
  Administered 2013-05-23: 17:00:00 via INTRAVENOUS

## 2013-05-23 MED ORDER — MEPERIDINE HCL 25 MG/ML IJ SOLN: 6.2500 mg | INTRAMUSCULAR | Status: DC | PRN

## 2013-05-23 MED ORDER — NALOXONE HCL 0.4 MG/ML IJ SOLN: 0.4000 mg | INTRAMUSCULAR | Status: DC | PRN

## 2013-05-23 MED ORDER — OXYTOCIN 40 UNITS IN LACTATED RINGERS INFUSION - SIMPLE MED: 62.5000 mL/h | INTRAVENOUS | Status: AC

## 2013-05-23 MED ORDER — ONDANSETRON HCL 4 MG/2ML IJ SOLN: 4.0000 mg | INTRAMUSCULAR | Status: DC | PRN

## 2013-05-23 MED ORDER — LACTATED RINGERS IV SOLN
INTRAVENOUS | Status: DC
  Administered 2013-05-23 (×2): via INTRAVENOUS

## 2013-05-23 MED ORDER — ONDANSETRON HCL 4 MG PO TABS: 4.0000 mg | ORAL_TABLET | ORAL | Status: DC | PRN

## 2013-05-23 MED ORDER — DIPHENHYDRAMINE HCL 25 MG PO CAPS: 25.0000 mg | ORAL_CAPSULE | Freq: Four times a day (QID) | ORAL | Status: DC | PRN

## 2013-05-23 MED ORDER — TETANUS-DIPHTH-ACELL PERTUSSIS 5-2.5-18.5 LF-MCG/0.5 IM SUSP: 0.5000 mL | Freq: Once | INTRAMUSCULAR | Status: DC

## 2013-05-23 MED ORDER — ONDANSETRON HCL 4 MG/2ML IJ SOLN
INTRAMUSCULAR | Status: DC | PRN
  Administered 2013-05-23: 4 mg via INTRAVENOUS

## 2013-05-23 MED ORDER — FENTANYL CITRATE 0.05 MG/ML IJ SOLN
INTRAMUSCULAR | Status: DC | PRN
  Administered 2013-05-23: 25 ug via INTRATHECAL
  Administered 2013-05-23 (×3): 25 ug via INTRAVENOUS

## 2013-05-23 MED ORDER — METOCLOPRAMIDE HCL 5 MG/ML IJ SOLN
10.0000 mg | Freq: Three times a day (TID) | INTRAMUSCULAR | Status: DC | PRN
  Administered 2013-05-23: 10 mg via INTRAVENOUS
  Filled 2013-05-23: qty 2

## 2013-05-23 MED ORDER — OXYTOCIN 10 UNIT/ML IJ SOLN
40.0000 [IU] | INTRAMUSCULAR | Status: DC | PRN
  Administered 2013-05-23: 40 [IU] via INTRAVENOUS

## 2013-05-23 MED ORDER — ZOLPIDEM TARTRATE 5 MG PO TABS: 5.0000 mg | ORAL_TABLET | Freq: Every evening | ORAL | Status: DC | PRN

## 2013-05-23 MED ORDER — NALOXONE HCL 1 MG/ML IJ SOLN
1.0000 ug/kg/h | INTRAVENOUS | Status: DC | PRN
  Filled 2013-05-23: qty 2

## 2013-05-23 MED ORDER — PRENATAL MULTIVITAMIN CH
1.0000 | ORAL_TABLET | Freq: Every day | ORAL | Status: DC
  Administered 2013-05-24: 1 via ORAL
  Filled 2013-05-23: qty 1

## 2013-05-23 MED ORDER — SIMETHICONE 80 MG PO CHEW
80.0000 mg | CHEWABLE_TABLET | ORAL | Status: DC | PRN
  Administered 2013-05-24: 80 mg via ORAL

## 2013-05-23 MED ORDER — PROMETHAZINE HCL 25 MG/ML IJ SOLN: 6.2500 mg | INTRAMUSCULAR | Status: DC | PRN

## 2013-05-23 MED ORDER — MIDAZOLAM HCL 2 MG/2ML IJ SOLN: 0.5000 mg | Freq: Once | INTRAMUSCULAR | Status: DC | PRN

## 2013-05-23 MED ORDER — SCOPOLAMINE 1 MG/3DAYS TD PT72
1.0000 | MEDICATED_PATCH | Freq: Once | TRANSDERMAL | Status: DC
  Administered 2013-05-23: 1.5 mg via TRANSDERMAL
  Filled 2013-05-23: qty 1

## 2013-05-23 MED ORDER — MENTHOL 3 MG MT LOZG: 1.0000 | LOZENGE | OROMUCOSAL | Status: DC | PRN

## 2013-05-23 MED ORDER — FENTANYL CITRATE 0.05 MG/ML IJ SOLN: 25.0000 ug | INTRAMUSCULAR | Status: DC | PRN

## 2013-05-23 MED ORDER — DIBUCAINE 1 % RE OINT: 1.0000 "application " | TOPICAL_OINTMENT | RECTAL | Status: DC | PRN

## 2013-05-23 MED ORDER — KETOROLAC TROMETHAMINE 30 MG/ML IJ SOLN
INTRAMUSCULAR | Status: AC
  Administered 2013-05-23: 30 mg via INTRAVENOUS
  Filled 2013-05-23: qty 1

## 2013-05-23 SURGICAL SUPPLY — 33 items
CLAMP CORD UMBIL (MISCELLANEOUS) ×2 IMPLANT
CLOTH BEACON ORANGE TIMEOUT ST (SAFETY) ×2 IMPLANT
CONTAINER PREFILL 10% NBF 15ML (MISCELLANEOUS) IMPLANT
DRAPE LG THREE QUARTER DISP (DRAPES) ×2 IMPLANT
DRSG OPSITE POSTOP 4X10 (GAUZE/BANDAGES/DRESSINGS) ×2 IMPLANT
DURAPREP 26ML APPLICATOR (WOUND CARE) ×2 IMPLANT
ELECT REM PT RETURN 9FT ADLT (ELECTROSURGICAL) ×2
ELECTRODE REM PT RTRN 9FT ADLT (ELECTROSURGICAL) ×1 IMPLANT
EXTRACTOR VACUUM KIWI (MISCELLANEOUS) IMPLANT
EXTRACTOR VACUUM M CUP 4 TUBE (SUCTIONS) IMPLANT
GLOVE BIO SURGEON STRL SZ8 (GLOVE) ×2 IMPLANT
GLOVE ECLIPSE 6.5 STRL STRAW (GLOVE) ×4 IMPLANT
GLOVE INDICATOR 7.0 STRL GRN (GLOVE) ×2 IMPLANT
GLOVE NEODERM STER SZ 7 (GLOVE) ×2 IMPLANT
GLOVE ORTHO TXT STRL SZ7.5 (GLOVE) ×2 IMPLANT
GOWN PREVENTION PLUS XLARGE (GOWN DISPOSABLE) ×2 IMPLANT
GOWN STRL REIN XL XLG (GOWN DISPOSABLE) ×2 IMPLANT
KIT ABG SYR 3ML LUER SLIP (SYRINGE) IMPLANT
NEEDLE HYPO 25X5/8 SAFETYGLIDE (NEEDLE) ×2 IMPLANT
NS IRRIG 1000ML POUR BTL (IV SOLUTION) ×2 IMPLANT
PACK C SECTION WH (CUSTOM PROCEDURE TRAY) ×2 IMPLANT
PAD OB MATERNITY 4.3X12.25 (PERSONAL CARE ITEMS) ×2 IMPLANT
RTRCTR C-SECT PINK 25CM LRG (MISCELLANEOUS) ×2 IMPLANT
STAPLER VISISTAT 35W (STAPLE) ×2 IMPLANT
SUT CHROMIC 1 CTX 36 (SUTURE) ×4 IMPLANT
SUT PLAIN 0 NONE (SUTURE) IMPLANT
SUT PLAIN 2 0 XLH (SUTURE) ×2 IMPLANT
SUT VIC AB 0 CT1 27 (SUTURE) ×2
SUT VIC AB 0 CT1 27XBRD ANBCTR (SUTURE) ×2 IMPLANT
SUT VIC AB 4-0 KS 27 (SUTURE) IMPLANT
TOWEL OR 17X24 6PK STRL BLUE (TOWEL DISPOSABLE) ×2 IMPLANT
TRAY FOLEY CATH 14FR (SET/KITS/TRAYS/PACK) ×2 IMPLANT
WATER STERILE IRR 1000ML POUR (IV SOLUTION) ×2 IMPLANT

## 2013-05-23 NOTE — Anesthesia Preprocedure Evaluation (Addendum)
Anesthesia Evaluation  Patient identified by MRN, date of birth, ID band Patient awake    Reviewed: Allergy & Precautions, H&P , Patient's Chart, lab work & pertinent test results, reviewed documented beta blocker date and time   Airway Mallampati: II TM Distance: >3 FB Neck ROM: full    Dental  (+) Teeth Intact   Pulmonary neg pulmonary ROS,  breath sounds clear to auscultation        Cardiovascular negative cardio ROS  Rhythm:regular Rate:Normal     Neuro/Psych negative neurological ROS  negative psych ROS   GI/Hepatic negative GI ROS, Neg liver ROS,   Endo/Other  negative endocrine ROSdiabetes, GestationalMorbid obesity  Renal/GU negative Renal ROS     Musculoskeletal   Abdominal   Peds  Hematology negative hematology ROS (+)   Anesthesia Other Findings   Reproductive/Obstetrics (+) Pregnancy                          Anesthesia Physical  Anesthesia Plan  ASA: III  Anesthesia Plan: Epidural and Spinal   Post-op Pain Management:    Induction:   Airway Management Planned:   Additional Equipment:   Intra-op Plan:   Post-operative Plan:   Informed Consent: I have reviewed the patients History and Physical, chart, labs and discussed the procedure including the risks, benefits and alternatives for the proposed anesthesia with the patient or authorized representative who has indicated his/her understanding and acceptance.     Plan Discussed with:   Anesthesia Plan Comments:         Anesthesia Quick Evaluation

## 2013-05-23 NOTE — Anesthesia Postprocedure Evaluation (Signed)
  Anesthesia Post Note  Patient: Kelly Gregory  Procedure(s) Performed: Procedure(s) (LRB): CESAREAN SECTION (N/A)  Anesthesia type: Spinal  Patient location: PACU  Post pain: Pain level controlled  Post assessment: Post-op Vital signs reviewed  Last Vitals:  Filed Vitals:   05/23/13 1015  BP:   Pulse: 69  Temp:   Resp: 17    Post vital signs: Reviewed  Level of consciousness: awake  Complications: No apparent anesthesia complications

## 2013-05-23 NOTE — Consult Note (Signed)
Reason for Consult: 8 mm Chiari malformation Referring Physician: Dr. Brayton Gregory  Kelly Gregory is an 27 y.o. female.  HPI: Is contacted by Dr. Brayton Gregory to give an opinion regarding the patient who has an 8 mm Arnold-Chiari malformation and whether we'll be safe to do epidural anesthesia. No films are available for review however if the cerebellar tonsils are sunk below the foramen magnum by 8 mm is truly a mild or minimal Chiari malformation. I do not believe that there would be a contraindication to proceeding with epidural analgesia.  Past Medical History  Diagnosis Date  . Syncope   . Arthritis   . Gestational diabetes     Past Surgical History  Procedure Laterality Date  . Tooth extraction    . Cystectomy    . Cesarean section  04/01/2011    Procedure: CESAREAN SECTION;  Surgeon: Kelly Gregory;  Location: [facility];  Service: Gynecology;  Laterality: N/A;    Family History  Problem Relation Age of Onset  . Adopted: Yes    Social History:  reports that she has never smoked. She does not have any smokeless tobacco history on file. She reports that she does not drink alcohol or use illicit drugs.  Allergies:  Allergies  Allergen Reactions  . Azithromycin Nausea And Vomiting  . Tequin Nausea And Vomiting  . Penicillins Hives    Medications: I have not reviewed the patient's current medications. I have not reviewed the patient's medication  Results for orders placed during the hospital encounter of 05/23/13 (from the past 48 hour(s))  GLUCOSE, CAPILLARY     Status: Abnormal   Collection Time    05/23/13  7:43 AM      Result Value Range   Glucose-Capillary 115 (*) 70 - 99 mg/dL  TYPE AND SCREEN     Status: None   Collection Time    05/23/13  7:50 AM      Result Value Range   ABO/RH(D) A POS     Antibody Screen NEG     Sample Expiration 05/26/2013    CBC     Status: None   Collection Time    05/23/13  7:59 AM      Result Value Range   WBC 9.4  4.0 -  10.5 K/uL   RBC 4.94  3.87 - 5.11 MIL/uL   Hemoglobin 14.4  12.0 - 15.0 g/dL   HCT 16.1  09.6 - 04.5 %   MCV 83.0  78.0 - 100.0 fL   MCH 29.1  26.0 - 34.0 pg   MCHC 35.1  30.0 - 36.0 g/dL   RDW 40.9  81.1 - 91.4 %   Platelets 211  150 - 400 K/uL  GLUCOSE, CAPILLARY     Status: Abnormal   Collection Time    05/23/13  9:56 AM      Result Value Range   Glucose-Capillary 148 (*) 70 - 99 mg/dL   Comment 1 Documented in Chart      No results found.  Review of Systems  Unable to perform ROS  Blood pressure 135/90, pulse 71, temperature 97.7 F (36.5 C), temperature source Oral, resp. rate 20, height 4\' 7"  (1.397 m), weight 84.596 kg (186 lb 8 oz), last menstrual period 04/25/2012, SpO2 100.00%, unknown if currently breastfeeding. Physical Exam the patient is not examined  Assessment/Plan: 8 mm asymptomatic Chiari malformation, no contraindication to epidural anesthesia.  Kelly Gregory J 05/23/2013, 12:22 PM

## 2013-05-23 NOTE — Preoperative (Signed)
Beta Blockers   Reason not to administer Beta Blockers:Not Applicable 

## 2013-05-23 NOTE — MAU Note (Signed)
PT SAYS SHE STARTED HURTING BAD AT 0130.  IS A REPEAT C/S  9-12-  AT 0800

## 2013-05-23 NOTE — Transfer of Care (Signed)
Immediate Anesthesia Transfer of Care Note  Patient: Kelly Gregory  Procedure(s) Performed: Procedure(s): CESAREAN SECTION (N/A)  Patient Location: PACU  Anesthesia Type:Spinal  Level of Consciousness: awake, alert  and oriented  Airway & Oxygen Therapy: Patient Spontanous Breathing  Post-op Assessment: Report given to PACU RN and Post -op Vital signs reviewed and stable  Post vital signs: Reviewed and stable  Complications: No apparent anesthesia complications

## 2013-05-23 NOTE — Anesthesia Postprocedure Evaluation (Signed)
Anesthesia Post Note  Patient: Kelly Gregory  Procedure(s) Performed: Procedure(s) (LRB): CESAREAN SECTION (N/A)  Anesthesia type: Spinal  Patient location: Mother/Baby  Post pain: Pain level controlled  Post assessment: Post-op Vital signs reviewed  Last Vitals:  Filed Vitals:   05/23/13 1757  BP: 115/65  Pulse: 79  Temp: 36.9 C  Resp: 18    Post vital signs: Reviewed  Level of consciousness: awake  Complications: No apparent anesthesia complications

## 2013-05-23 NOTE — Anesthesia Procedure Notes (Signed)
Spinal  Patient location during procedure: OR Preanesthetic Checklist Completed: patient identified, site marked, surgical consent, pre-op evaluation, timeout performed, IV checked, risks and benefits discussed and monitors and equipment checked Spinal Block Patient position: sitting Prep: DuraPrep Patient monitoring: heart rate, cardiac monitor, continuous pulse ox and blood pressure Approach: midline Location: L3-4 Injection technique: single-shot Needle Needle type: Sprotte  Needle gauge: 24 G Needle length: 9 cm Assessment Sensory level: T4 Additional Notes Spinal Dosage in OR  Bupivicaine ml       1.0 PFMS04   mcg        150 Fentanyl mcg            25    

## 2013-05-23 NOTE — Op Note (Signed)
Preoperative diagnosis: Intrauterine pregnancy at 38 weeks, labor, previous cesarean section Postoperative diagnosis: Same Procedure: Repeat low transverse cesarean section without extensions Surgeon: Lavina Hamman M.D. Anesthesia: Spinal   Findings: Patient had normal gravid anatomy and delivered a viable female infant with Apgars of 9 and 9 weight pending Estimated blood loss: 800 cc Specimens: Placenta sent to labor and delivery Complications: None  Procedure in detail: The patient was taken to the operating room and placed in the sitting position. Dr. Cristela Blue instilled spinal anesthesia.  She was then placed in the dorsosupine position with left tilt. Abdomen was then prepped and draped in the usual sterile fashion, and a foley catheter was inserted. The level of her anesthesia was found to be adequate. Abdomen was entered via a standard Pfannenstiel incision through her previous scar. Once the peritoneal cavity was entered the Alexis disposable self-retaining retractor was placed and good visualization was achieved. A 4 cm transverse incision was then made in the lower uterine segment pushing the bladder inferior. Once the uterine cavity was entered the incision was extended digitally. The fetal vertex was grasped and delivered through the incision atraumatically. Mouth and nares were suctioned. The remainder of the infant then delivered atraumatically. Cord was doubly clamped and cut and the infant handed to the awaiting pediatric team. Cord blood was obtained. The placenta delivered spontaneously. Uterus was wiped dry with clean lap pad and all clots and debris were removed. Uterine incision was inspected and found to be free of extensions. Uterine incision was closed in 1 layer with running locking #1 Chromic. Tubes and ovaries were inspected and found to be normal. Uterine incision was inspected and found to be hemostatic. Bleeding from serosal edges was controlled with electrocautery. The  Alexis retractor was removed. Subfascial space was irrigated and made hemostatic with electrocautery. Peritoneum was closed with interrupted #1 Chromic.  Fascia was closed in running fashion starting at both ends and meeting in the middle with 0 Vicryl. Subcutaneous tissue was then irrigated and made hemostatic with electrocautery, then closed with running 2-0 plain gut. Skin was closed with staples followed by a sterile dressing. Patient tolerated the procedure well and was taken to the recovery in stable condition. Counts were correct x2, she received Ancef 2 g IV at the beginning of the procedure and she had PAS hose on throughout the procedure.

## 2013-05-23 NOTE — H&P (Signed)
Kelly Gregory is a 27 y.o. female, G3 P1011, EGA 38+ weeks with EDC 9-18 presenting for eval of ctx.  Eval in MAU revealed reg ctx, VE 5 cm dilated.  Prenatal care complicated by previous c-section, scheduled for repeat c-section this Friday.  Also with GDM controlled with Glyburide 5 mg in pm, reactive NSTs.  Also with recent intermittent elevated BP with normal labs.  See prenatal records for complete history.  Maternal Medical History:  Reason for admission: Contractions.   Contractions: Frequency: regular.   Perceived severity is strong.    Fetal activity: Perceived fetal activity is normal.    Prenatal Complications - Diabetes: gestational. Diabetes is managed by oral agent (monotherapy).      OB History   Grav Para Term Preterm Abortions TAB SAB Ect Mult Living   3    1  1   1     C-section at term, 7 lbs 7 oz SAB  Past Medical History  Diagnosis Date  . Syncope   . Arthritis   . Gestational diabetes    Past Surgical History  Procedure Laterality Date  . Tooth extraction    . Cystectomy    . Cesarean section  04/01/2011    Procedure: CESAREAN SECTION;  Surgeon: Oliver Pila;  Location: [facility];  Service: Gynecology;  Laterality: N/A;   Family History: family history is not on file. She was adopted. Social History:  reports that she has never smoked. She does not have any smokeless tobacco history on file. She reports that she does not drink alcohol or use illicit drugs.   Prenatal Transfer Tool  Maternal Diabetes: Yes:  Diabetes Type:  Insulin/Medication controlled Genetic Screening: Normal Maternal Ultrasounds/Referrals: Normal Fetal Ultrasounds or other Referrals:  None Maternal Substance Abuse:  No Significant Maternal Medications:  Meds include: Other:  Significant Maternal Lab Results:  Lab values include: Group B Strep negative Other Comments:  GDM controlled with Glyburide, repeat c-section  Review of Systems  Respiratory: Negative.    Cardiovascular: Negative.     Dilation: 5 Effacement (%): 60 Station: -3 Exam by:: S. Carrera, RNC Blood pressure 141/98, pulse 85, temperature 97.5 F (36.4 C), temperature source Oral, resp. rate 18, height 4\' 7"  (1.397 m), weight 84.596 kg (186 lb 8 oz), last menstrual period 04/25/2012, SpO2 98.00%. Maternal Exam:  Uterine Assessment: Contraction strength is moderate.  Contraction frequency is regular.   Abdomen: Patient reports no abdominal tenderness. Surgical scars: low transverse.   Estimated fetal weight is 7 1/2 lbs.   Fetal presentation: vertex  Introitus: Normal vulva. Normal vagina.  Amniotic fluid character: not assessed.  Cervix: Cervix evaluated by digital exam.     Fetal Exam Fetal Monitor Review: Mode: ultrasound.   Variability: moderate (6-25 bpm).   Pattern: accelerations present and no decelerations.    Fetal State Assessment: Category I - tracings are normal.     Physical Exam  Constitutional: She appears well-developed and well-nourished.  Cardiovascular: Normal rate, regular rhythm and normal heart sounds.   No murmur heard. Respiratory: Effort normal and breath sounds normal. No respiratory distress. She has no wheezes.  GI: Soft.  Gravid     Prenatal labs: ABO, Rh:  A pos Antibody:  neg Rubella:  Imm RPR:   NR HBsAg:   Neg HIV:   Neg GBS:  Neg   Assessment/Plan: IUP at 38+ weeks in early labor, for repeat c-section.  Has had GDM controlled with Glyburide.  C-section procedure and risks discussed, she agrees  to proceed.  Will proceed with repeat c-section, Ancef ok for prophylaxis.     Kelly Gregory D 05/23/2013, 8:05 AM

## 2013-05-24 ENCOUNTER — Encounter (HOSPITAL_COMMUNITY): Payer: Self-pay | Admitting: Obstetrics and Gynecology

## 2013-05-24 ENCOUNTER — Inpatient Hospital Stay (HOSPITAL_COMMUNITY): Admission: RE | Admit: 2013-05-24 | Payer: BC Managed Care – PPO | Source: Ambulatory Visit

## 2013-05-24 LAB — CBC
Hemoglobin: 11.7 g/dL — ABNORMAL LOW (ref 12.0–15.0)
MCH: 29.3 pg (ref 26.0–34.0)
MCHC: 34.5 g/dL (ref 30.0–36.0)
Platelets: 165 10*3/uL (ref 150–400)
RBC: 4 MIL/uL (ref 3.87–5.11)

## 2013-05-24 LAB — GLUCOSE, CAPILLARY
Glucose-Capillary: 164 mg/dL — ABNORMAL HIGH (ref 70–99)
Glucose-Capillary: 128 mg/dL — ABNORMAL HIGH (ref 70–99)

## 2013-05-24 NOTE — Progress Notes (Signed)
Subjective: Postpartum Day #1: Cesarean Delivery Patient reports incisional pain and tolerating PO.    Objective: Vital signs in last 24 hours: Temp:  [97.5 F (36.4 C)-98.9 F (37.2 C)] 97.9 F (36.6 C) (09/10 0400) Pulse Rate:  [69-106] 85 (09/10 0400) Resp:  [17-23] 18 (09/10 0400) BP: (111-142)/(52-101) 115/74 mmHg (09/10 0400) SpO2:  [94 %-100 %] 96 % (09/09 2352)  Physical Exam:  General: alert Lochia: appropriate Uterine Fundus: firm Incision: dressing intact   Recent Labs  05/23/13 0759 05/24/13 0620  HGB 14.4 11.7*  HCT 41.0 33.9*    Assessment/Plan: Status post Cesarean section. Doing well postoperatively.  Continue current care, ambulate.  Discussed circumcision procedure and risks, she wishes to proceed.  Torra Pala D 05/24/2013, 7:58 AM

## 2013-05-25 DIAGNOSIS — E119 Type 2 diabetes mellitus without complications: Secondary | ICD-10-CM | POA: Diagnosis present

## 2013-05-25 DIAGNOSIS — E1165 Type 2 diabetes mellitus with hyperglycemia: Secondary | ICD-10-CM | POA: Diagnosis present

## 2013-05-25 MED ORDER — IBUPROFEN 600 MG PO TABS: 600.0000 mg | ORAL_TABLET | Freq: Four times a day (QID) | ORAL | Status: DC

## 2013-05-25 MED ORDER — OXYCODONE-ACETAMINOPHEN 5-325 MG PO TABS: 1.0000 | ORAL_TABLET | ORAL | Status: DC | PRN

## 2013-05-25 NOTE — Progress Notes (Signed)
POD #2 Doing well, wants to go home Afeb, VSS Abd- soft, fundus firm, incision appears intact Will d/c home

## 2013-05-25 NOTE — Discharge Summary (Signed)
Obstetric Discharge Summary Reason for Admission: onset of labor Prenatal Procedures: NST Intrapartum Procedures: cesarean: low cervical, transverse Postpartum Procedures: none Complications-Operative and Postpartum: none Hemoglobin  Date Value Range Status  05/24/2013 11.7* 12.0 - 15.0 g/dL Final     DELTA CHECK NOTED     REPEATED TO VERIFY     HCT  Date Value Range Status  05/24/2013 33.9* 36.0 - 46.0 % Final    Physical Exam:  General: alert Lochia: appropriate Uterine Fundus: firm Incision: healing well  Discharge Diagnoses: Term Pregnancy-delivered and GDM  Discharge Information: Date: 05/25/2013 Activity: pelvic rest and no strenuous activity Diet: routine Medications: Ibuprofen and Percocet Condition: stable Instructions: refer to practice specific booklet Discharge to: home Follow-up Information   Follow up with Kamaron Deskins D, MD. Schedule an appointment as soon as possible for a visit in 5 days. (for staple removal)    Specialty:  Obstetrics and Gynecology   Contact information:   [address] [place] [address] [phone]       Newborn Data: Live born female  Birth Weight: 8 lb 10.8 oz (3935 g) APGAR: 9, 9  Home with mother.  Shayleigh Bouldin D 05/25/2013, 8:04 AM

## 2013-05-26 ENCOUNTER — Inpatient Hospital Stay (HOSPITAL_COMMUNITY)
Admission: RE | Admit: 2013-05-26 | Payer: BC Managed Care – PPO | Source: Ambulatory Visit | Admitting: Obstetrics and Gynecology

## 2013-05-26 SURGERY — Surgical Case
Anesthesia: Regional

## 2013-06-07 ENCOUNTER — Encounter (HOSPITAL_COMMUNITY): Payer: Self-pay | Admitting: *Deleted

## 2014-07-16 ENCOUNTER — Encounter (HOSPITAL_COMMUNITY): Payer: Self-pay | Admitting: *Deleted

## 2015-08-23 ENCOUNTER — Encounter: Payer: Self-pay | Admitting: Primary Care

## 2015-08-23 ENCOUNTER — Ambulatory Visit (INDEPENDENT_AMBULATORY_CARE_PROVIDER_SITE_OTHER): Payer: BLUE CROSS/BLUE SHIELD | Admitting: Primary Care

## 2015-08-23 VITALS — BP 152/100 | HR 134 | Temp 97.9°F | Ht <= 58 in | Wt 166.4 lb

## 2015-08-23 DIAGNOSIS — O24419 Gestational diabetes mellitus in pregnancy, unspecified control: Secondary | ICD-10-CM

## 2015-08-23 DIAGNOSIS — Z23 Encounter for immunization: Secondary | ICD-10-CM | POA: Diagnosis not present

## 2015-08-23 DIAGNOSIS — IMO0001 Reserved for inherently not codable concepts without codable children: Secondary | ICD-10-CM

## 2015-08-23 DIAGNOSIS — I10 Essential (primary) hypertension: Secondary | ICD-10-CM | POA: Insufficient documentation

## 2015-08-23 DIAGNOSIS — R030 Elevated blood-pressure reading, without diagnosis of hypertension: Secondary | ICD-10-CM

## 2015-08-23 NOTE — Assessment & Plan Note (Signed)
No recent physical or BS follow up. Will do A1C at upcoming physical due to obesity and history.

## 2015-08-23 NOTE — Assessment & Plan Note (Addendum)
Elevated in clinic today at 162/100 with 150/100 upon repeat. History of hypertension during first pregnancy. Asymptomatic today. She will check her BP daily for next 2 weeks and send in readings. If elevated, will initiate treatment. Will continue to monitor.

## 2015-08-23 NOTE — Progress Notes (Signed)
Pre visit review using our clinic review tool, if applicable. No additional management support is needed unless otherwise documented below in the visit note. 

## 2015-08-23 NOTE — Progress Notes (Signed)
Subjective:    Patient ID: Kelly Gregory, female    DOB: 03-22-1986, 29 y.o.   MRN: [id number]  HPI  Kelly Gregory is a 29 year old female who presents today to establish care and discuss the problems mentioned below. Will obtain old records. She also has paperwork for [employer]. Her last physical was several years ago.  1) Elevated Blood Pressure: Elevated in clinic today at 162/100, 152/100 upon recheck. She had several elevated readings during her first pregnancy and had to deliver early due to readings. She will have headaches once monthly. Denies chest pain, dizziness.  2) Gestational Diabetes: History of with both children. Has not had recent physical or check up. Denies dizziness, polyuria, numbness/tingling.  Review of Systems  Constitutional: Negative for unexpected weight change.  HENT: Negative for rhinorrhea.   Respiratory: Negative for cough and shortness of breath.   Cardiovascular: Negative for chest pain.  Gastrointestinal: Negative for diarrhea and constipation.  Genitourinary: Negative for difficulty urinating.  Musculoskeletal: Negative for myalgias and arthralgias.  Skin: Negative for rash.  Allergic/Immunologic: Positive for environmental allergies.  Neurological: Negative for dizziness, numbness and headaches.  Psychiatric/Behavioral:       Denies concerns for anxiety or depression.       Past Medical History  Diagnosis Date  . Syncope     As a child, no problems during adulthood.  . Gestational diabetes   . Elevated blood pressure   . Rheumatoid arthritis (HCC)     Years ago, does not follow.    Social History   Social History  . Marital Status: Married    Spouse Name: N/A  . Number of Children: N/A  . Years of Education: N/A   Occupational History  . Not on file.   Social History Main Topics  . Smoking status: Never Smoker   . Smokeless tobacco: Not on file  . Alcohol Use: No  . Drug Use: No  . Sexual Activity: Yes    Birth  Control/ Protection: Pill   Other Topics Concern  . Not on file   Social History Narrative   Married.   2 children.   Works as a Paramedic.   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    Past Surgical History  Procedure Laterality Date  . Tooth extraction    . Cystectomy    . Cesarean section  04/01/2011    Procedure: CESAREAN SECTION;  Surgeon: Oliver Pila;  Location: [facility];  Service: Gynecology;  Laterality: N/A;  . Cesarean section N/A 05/23/2013    Procedure: CESAREAN SECTION;  Surgeon: Lavina Hamman, MD;  Location: [facility];  Service: Obstetrics;  Laterality: N/A;    Family History  Problem Relation Age of Onset  . Adopted: Yes  . Other      Adopted    Allergies  Allergen Reactions  . Azithromycin Nausea And Vomiting  . Tequin Nausea And Vomiting  . Penicillins Hives    No current outpatient prescriptions on file prior to visit.   No current facility-administered medications on file prior to visit.    BP 152/100 mmHg  Pulse 134  Temp(Src) 97.9 F (36.6 C) (Oral)  Ht 4\' 9"  (1.448 m)  Wt 166 lb 6.4 oz (75.479 kg)  BMI 36.00 kg/m2  SpO2 98%  LMP 08/23/2015  Breastfeeding? No    Objective:   Physical Exam  Constitutional: She is oriented to person, place, and time. She appears well-nourished.  Cardiovascular: Normal rate and regular rhythm.  Pulmonary/Chest: Effort normal and breath sounds normal.  Neurological: She is alert and oriented to person, place, and time.  Skin: Skin is warm and dry.  Psychiatric: She has a normal mood and affect.          Assessment & Plan:

## 2015-08-23 NOTE — Patient Instructions (Signed)
Please schedule a physical with me within the next 3 months. You may also schedule a lab only appointment 3-4 days prior. We will discuss your lab results in detail during your physical.  Check your blood pressure daily, around the same time of day, for the next 2 weeks.   Ensure that you have rested for 30 minutes prior to checking your blood pressure. Record your readings and call them in to the office in 2 weeks.  It was a pleasure to meet you today! Please don't hesitate to call me with any questions. Welcome to [facility]!

## 2015-09-06 ENCOUNTER — Telehealth: Payer: Self-pay | Admitting: Primary Care

## 2015-09-06 NOTE — Telephone Encounter (Signed)
Will you please check on Kelly Gregory's BP?  She was to check it for 2 weeks and send them into me. Thanks.

## 2015-09-06 NOTE — Telephone Encounter (Signed)
Message left for patient to return my call for BP readings.

## 2015-09-17 ENCOUNTER — Other Ambulatory Visit: Payer: Self-pay | Admitting: Primary Care

## 2015-09-17 DIAGNOSIS — Z8639 Personal history of other endocrine, nutritional and metabolic disease: Secondary | ICD-10-CM

## 2015-09-17 DIAGNOSIS — Z0000 Encounter for general adult medical examination without abnormal findings: Secondary | ICD-10-CM

## 2015-09-17 NOTE — Telephone Encounter (Signed)
Message left for patient to return my call.  

## 2015-09-19 ENCOUNTER — Telehealth: Payer: Self-pay | Admitting: Primary Care

## 2015-09-19 NOTE — Telephone Encounter (Signed)
bp readings :all taken around 930 pm  124/82 12/31 123/82 01/01 118/82 01/02 112/82 01/03 120/78 01/04 118/77 01/05

## 2015-09-19 NOTE — Telephone Encounter (Signed)
Please notify ms. Preisler that hese blood pressure readings look great! Thanks.

## 2015-09-20 NOTE — Telephone Encounter (Signed)
Left message for patient regarding Kate's comments.

## 2015-09-27 ENCOUNTER — Other Ambulatory Visit (INDEPENDENT_AMBULATORY_CARE_PROVIDER_SITE_OTHER): Payer: BLUE CROSS/BLUE SHIELD

## 2015-09-27 DIAGNOSIS — Z8639 Personal history of other endocrine, nutritional and metabolic disease: Secondary | ICD-10-CM

## 2015-09-27 DIAGNOSIS — Z0000 Encounter for general adult medical examination without abnormal findings: Secondary | ICD-10-CM

## 2015-09-27 LAB — TSH: TSH: 1.63 u[IU]/mL (ref 0.35–4.50)

## 2015-09-27 LAB — COMPREHENSIVE METABOLIC PANEL
ALT: 39 U/L — AB (ref 0–35)
AST: 18 U/L (ref 0–37)
Albumin: 3.5 g/dL (ref 3.5–5.2)
Alkaline Phosphatase: 82 U/L (ref 39–117)
BILIRUBIN TOTAL: 0.4 mg/dL (ref 0.2–1.2)
BUN: 14 mg/dL (ref 6–23)
CHLORIDE: 102 meq/L (ref 96–112)
CO2: 27 meq/L (ref 19–32)
CREATININE: 0.64 mg/dL (ref 0.40–1.20)
Calcium: 9 mg/dL (ref 8.4–10.5)
GFR: 114.53 mL/min (ref >60.00)
Glucose, Bld: 134 mg/dL — ABNORMAL HIGH (ref 70–99)
Potassium: 4.2 mEq/L (ref 3.5–5.1)
SODIUM: 135 meq/L (ref 135–145)
Total Protein: 7.2 g/dL (ref 6.0–8.3)

## 2015-09-27 LAB — CBC
HCT: 44.8 % (ref 36.0–46.0)
Hemoglobin: 15 g/dL (ref 12.0–15.0)
MCHC: 33.5 g/dL (ref 30.0–36.0)
MCV: 86.9 fl (ref 78.0–100.0)
Platelets: 344 10*3/uL (ref 150.0–400.0)
RBC: 5.15 Mil/uL — AB (ref 3.87–5.11)
RDW: 13.4 % (ref 11.5–15.5)
WBC: 8 10*3/uL (ref 4.0–10.5)

## 2015-09-27 LAB — HEMOGLOBIN A1C: Hgb A1c MFr Bld: 6.3 % (ref 4.6–6.5)

## 2015-09-27 LAB — LIPID PANEL
CHOL/HDL RATIO: 8
Cholesterol: 261 mg/dL — ABNORMAL HIGH (ref 0–200)
HDL: 30.8 mg/dL — ABNORMAL LOW (ref >39.00)
LDL CALC: 195 mg/dL — AB (ref 0–99)
NONHDL: 229.94
Triglycerides: 176 mg/dL — ABNORMAL HIGH (ref 0.0–149.0)
VLDL: 35.2 mg/dL (ref 0.0–40.0)

## 2015-10-04 ENCOUNTER — Encounter: Payer: Self-pay | Admitting: Primary Care

## 2015-10-04 ENCOUNTER — Ambulatory Visit (INDEPENDENT_AMBULATORY_CARE_PROVIDER_SITE_OTHER): Payer: BLUE CROSS/BLUE SHIELD | Admitting: Primary Care

## 2015-10-04 VITALS — BP 132/82 | HR 112 | Temp 97.9°F | Ht <= 58 in | Wt 166.1 lb

## 2015-10-04 DIAGNOSIS — Z0001 Encounter for general adult medical examination with abnormal findings: Secondary | ICD-10-CM | POA: Insufficient documentation

## 2015-10-04 DIAGNOSIS — R7303 Prediabetes: Secondary | ICD-10-CM | POA: Diagnosis not present

## 2015-10-04 DIAGNOSIS — Z0000 Encounter for general adult medical examination without abnormal findings: Secondary | ICD-10-CM | POA: Diagnosis not present

## 2015-10-04 DIAGNOSIS — R030 Elevated blood-pressure reading, without diagnosis of hypertension: Secondary | ICD-10-CM

## 2015-10-04 DIAGNOSIS — E785 Hyperlipidemia, unspecified: Secondary | ICD-10-CM

## 2015-10-04 DIAGNOSIS — IMO0001 Reserved for inherently not codable concepts without codable children: Secondary | ICD-10-CM

## 2015-10-04 NOTE — Patient Instructions (Signed)
It is important that you improve your diet. Please limit carbohydrates in the form of white bread, rice, pasta, fast foods, casseroles, sugary drinks, etc. Increase your consumption of fresh fruits and vegetables.  You need to consume about 2 liters of water daily.  Start exercising. You should be getting 1 hour of moderate intensity exercise 5 days weekly.  Schedule a lab only appointment in 3 months to re-check diabetes and cholesterol levels.  Diabetes Mellitus and Food It is important for you to manage your blood sugar (glucose) level. Your blood glucose level can be greatly affected by what you eat. Eating healthier foods in the appropriate amounts throughout the day at about the same time each day will help you control your blood glucose level. It can also help slow or prevent worsening of your diabetes mellitus. Healthy eating may even help you improve the level of your blood pressure and reach or maintain a healthy weight.  General recommendations for healthful eating and cooking habits include:  Eating meals and snacks regularly. Avoid going long periods of time without eating to lose weight.  Eating a diet that consists mainly of plant-based foods, such as fruits, vegetables, nuts, legumes, and whole grains.  Using low-heat cooking methods, such as baking, instead of high-heat cooking methods, such as deep frying. Work with your dietitian to make sure you understand how to use the Nutrition Facts information on food labels. HOW CAN FOOD AFFECT ME? Carbohydrates Carbohydrates affect your blood glucose level more than any other type of food. Your dietitian will help you determine how many carbohydrates to eat at each meal and teach you how to count carbohydrates. Counting carbohydrates is important to keep your blood glucose at a healthy level, especially if you are using insulin or taking certain medicines for diabetes mellitus. Alcohol Alcohol can cause sudden decreases in blood  glucose (hypoglycemia), especially if you use insulin or take certain medicines for diabetes mellitus. Hypoglycemia can be a life-threatening condition. Symptoms of hypoglycemia (sleepiness, dizziness, and disorientation) are similar to symptoms of having too much alcohol.  If your health care provider has given you approval to drink alcohol, do so in moderation and use the following guidelines:  Women should not have more than one drink per day, and men should not have more than two drinks per day. One drink is equal to:  12 oz of beer.  5 oz of wine.  1 oz of hard liquor.  Do not drink on an empty stomach.  Keep yourself hydrated. Have water, diet soda, or unsweetened iced tea.  Regular soda, juice, and other mixers might contain a lot of carbohydrates and should be counted. WHAT FOODS ARE NOT RECOMMENDED? As you make food choices, it is important to remember that all foods are not the same. Some foods have fewer nutrients per serving than other foods, even though they might have the same number of calories or carbohydrates. It is difficult to get your body what it needs when you eat foods with fewer nutrients. Examples of foods that you should avoid that are high in calories and carbohydrates but low in nutrients include:  Trans fats (most processed foods list trans fats on the Nutrition Facts label).  Regular soda.  Juice.  Candy.  Sweets, such as cake, pie, doughnuts, and cookies.  Fried foods. WHAT FOODS CAN I EAT? Eat nutrient-rich foods, which will nourish your body and keep you healthy. The food you should eat also will depend on several factors, including:  The  calories you need.  The medicines you take.  Your weight.  Your blood glucose level.  Your blood pressure level.  Your cholesterol level. You should eat a variety of foods, including:  Protein.  Lean cuts of meat.  Proteins low in saturated fats, such as fish, egg whites, and beans. Avoid processed  meats.  Fruits and vegetables.  Fruits and vegetables that may help control blood glucose levels, such as apples, mangoes, and yams.  Dairy products.  Choose fat-free or low-fat dairy products, such as milk, yogurt, and cheese.  Grains, bread, pasta, and rice.  Choose whole grain products, such as multigrain bread, whole oats, and brown rice. These foods may help control blood pressure.  Fats.  Foods containing healthful fats, such as nuts, avocado, olive oil, canola oil, and fish. DOES EVERYONE WITH DIABETES MELLITUS HAVE THE SAME MEAL PLAN? Because every person with diabetes mellitus is different, there is not one meal plan that works for everyone. It is very important that you meet with a dietitian who will help you create a meal plan that is just right for you.   This information is not intended to replace advice given to you by your health care provider. Make sure you discuss any questions you have with your health care provider.   Document Released: 05/28/2005 Document Revised: 09/21/2014 Document Reviewed: 07/28/2013 Elsevier Interactive Patient Education [employer]

## 2015-10-04 NOTE — Progress Notes (Signed)
Pre visit review using our clinic review tool, if applicable. No additional management support is needed unless otherwise documented below in the visit note. 

## 2015-10-04 NOTE — Assessment & Plan Note (Signed)
Home readings and clinic reading today stable. Will continue to monitor.

## 2015-10-04 NOTE — Progress Notes (Signed)
Subjective:    Patient ID: Kelly Gregory, female    DOB: [birth year], 30 y.o.   MRN: [id number]  HPI  Kelly Gregory is a 30 year old female who presents today for complete physical.  Immunizations: -Tetanus: Completed in 2013 -Influenza: Completed in December 2016   Diet: Endorses a fair diet. Breakfast: Fruit, protein shake Lunch: Sandwich  Dinner: Baked/grilled chicken, casseroles, vegetables, pastas, fast food. Eats fast food once weekly on average.  Snacks: Potato chips Desserts: Occasionally Beverages: Water  Exercise: She is not currently exercising Eye exam: Completed 2 months ago. Negative for diabetic retinopathy.  Dental exam: Completed 1 year ago. Pap Smear: Completed 1 year ago.    Review of Systems  Constitutional: Negative for unexpected weight change.  HENT: Negative for rhinorrhea.   Respiratory: Negative for cough and shortness of breath.   Cardiovascular: Negative for chest pain.  Gastrointestinal: Negative for diarrhea and constipation.  Genitourinary: Negative for difficulty urinating.  Musculoskeletal: Negative for myalgias and arthralgias.  Skin: Negative for rash.  Allergic/Immunologic: Negative for environmental allergies.  Neurological: Negative for dizziness and numbness.       Several times weekly headaches with improvement with ibuprofen  Psychiatric/Behavioral:       Denies concenrs for anxiety or depression       Past Medical History  Diagnosis Date  . Syncope     As a child, no problems during adulthood.  . Gestational diabetes   . Elevated blood pressure   . Rheumatoid arthritis (HCC)     Years ago, does not follow.    Social History   Social History  . Marital Status: Married    Spouse Name: N/A  . Number of Children: N/A  . Years of Education: N/A   Occupational History  . Not on file.   Social History Main Topics  . Smoking status: Never Smoker   . Smokeless tobacco: Not on file  . Alcohol Use: No  . Drug Use: No    . Sexual Activity: Yes    Birth Control/ Protection: Pill   Other Topics Concern  . Not on file   Social History Narrative   Married.   2 children.   Works as a Paramedic.   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    Past Surgical History  Procedure Laterality Date  . Tooth extraction    . Cystectomy    . Cesarean section  04/01/2011    Procedure: CESAREAN SECTION;  Surgeon: Oliver Pila;  Location: [facility];  Service: Gynecology;  Laterality: N/A;  . Cesarean section N/A 05/23/2013    Procedure: CESAREAN SECTION;  Surgeon: Lavina Hamman, MD;  Location: [facility];  Service: Obstetrics;  Laterality: N/A;    Family History  Problem Relation Age of Onset  . Adopted: Yes  . Other      Adopted    Allergies  Allergen Reactions  . Azithromycin Nausea And Vomiting  . Tequin Nausea And Vomiting  . Penicillins Hives    Current Outpatient Prescriptions on File Prior to Visit  Medication Sig Dispense Refill  . norethindrone (MICRONOR,CAMILA,ERRIN) 0.35 MG tablet Take 1 tablet by mouth daily.     No current facility-administered medications on file prior to visit.    BP 132/82 mmHg  Pulse 112  Temp(Src) 97.9 F (36.6 C) (Oral)  Ht 4\' 9"  (1.448 m)  Wt 166 lb 1.9 oz (75.352 kg)  BMI 35.94 kg/m2  SpO2 98%  LMP 09/15/2015    Objective:  Physical Exam  Constitutional: She is oriented to person, place, and time. She appears well-nourished.  HENT:  Right Ear: Tympanic membrane and ear canal normal.  Left Ear: Tympanic membrane and ear canal normal.  Nose: Nose normal.  Mouth/Throat: Oropharynx is clear and moist.  Eyes: Conjunctivae and EOM are normal. Pupils are equal, round, and reactive to light.  Neck: Neck supple. No thyromegaly present.  Cardiovascular: Normal rate and regular rhythm.   No murmur heard. Pulmonary/Chest: Effort normal and breath sounds normal. She has no rales.  Abdominal: Soft. Bowel sounds are normal. There is no tenderness.   Musculoskeletal: Normal range of motion.  Lymphadenopathy:    She has no cervical adenopathy.  Neurological: She is alert and oriented to person, place, and time. She has normal reflexes. No cranial nerve deficit.  Skin: Skin is warm and dry. No rash noted.  Scar tissue under left axilla from previous surgery. No s/s of infection.  Psychiatric: She has a normal mood and affect.          Assessment & Plan:

## 2015-10-04 NOTE — Assessment & Plan Note (Signed)
TC of 261, LDL 195, trigs elevated. Discussed the importance of a healthy diet and regular exercise in order for weight loss to reduce cholesterol. Will give her 3 months to work on diet and exercise. Repeat lipids in 3 months. If elevated, will consider treatment.

## 2015-10-04 NOTE — Assessment & Plan Note (Signed)
A1C of 6.3 from recent labs. History of gestational diabetes. Spent a long time discussing healthy diet and exercise. Low sugar, low simple carb diet. Handouts provided. Will recheck in 3 months.

## 2015-10-04 NOTE — Assessment & Plan Note (Signed)
Tdap and flu UTD. Pap UTD.  Labs with hyperlipidemia and borderline diabetes. Exam unremarkable. Discussed the importance of a healthy diet and regular exercise in order for weight loss and to reduce risk of other medical diseases. Follow up in 1 year for repeat physical.

## 2015-12-27 ENCOUNTER — Other Ambulatory Visit: Payer: Self-pay | Admitting: Primary Care

## 2015-12-27 DIAGNOSIS — R7303 Prediabetes: Secondary | ICD-10-CM

## 2015-12-27 DIAGNOSIS — E785 Hyperlipidemia, unspecified: Secondary | ICD-10-CM

## 2016-01-03 ENCOUNTER — Other Ambulatory Visit: Payer: Self-pay | Admitting: Primary Care

## 2016-01-03 ENCOUNTER — Other Ambulatory Visit (INDEPENDENT_AMBULATORY_CARE_PROVIDER_SITE_OTHER): Payer: BLUE CROSS/BLUE SHIELD

## 2016-01-03 DIAGNOSIS — R7303 Prediabetes: Secondary | ICD-10-CM

## 2016-01-03 DIAGNOSIS — E785 Hyperlipidemia, unspecified: Secondary | ICD-10-CM

## 2016-01-03 DIAGNOSIS — E119 Type 2 diabetes mellitus without complications: Secondary | ICD-10-CM

## 2016-01-03 LAB — LIPID PANEL
CHOL/HDL RATIO: 8
Cholesterol: 255 mg/dL — ABNORMAL HIGH (ref 0–200)
HDL: 34 mg/dL — AB (ref >39.00)
NONHDL: 221.23
TRIGLYCERIDES: 269 mg/dL — AB (ref 0.0–149.0)
VLDL: 53.8 mg/dL — ABNORMAL HIGH (ref 0.0–40.0)

## 2016-01-03 LAB — HEMOGLOBIN A1C: Hgb A1c MFr Bld: 6.5 % (ref 4.6–6.5)

## 2016-01-03 LAB — LDL CHOLESTEROL, DIRECT: LDL DIRECT: 183 mg/dL

## 2016-01-03 MED ORDER — METFORMIN HCL ER 500 MG PO TB24: 500.0000 mg | ORAL_TABLET | Freq: Every day | ORAL | Status: DC

## 2016-01-03 MED ORDER — ATORVASTATIN CALCIUM 40 MG PO TABS: 40.0000 mg | ORAL_TABLET | Freq: Every day | ORAL | Status: DC

## 2016-01-03 NOTE — Assessment & Plan Note (Signed)
Lipids above goal, especially given new diagnosis of diabetes. Lipitor 40 mg sent to pharmacy. LFT's WNL.  Repeat labs in 3 months.

## 2016-01-03 NOTE — Assessment & Plan Note (Signed)
A1C of 6.5 today. Start Metformin XR 500 mg once daily. Also initiated on statin for uncontrolled hyperlipidemia. Urine Micro albumin, pneumovax, and foot exam next visit. Follow up in 3 months.

## 2016-01-07 ENCOUNTER — Telehealth: Payer: Self-pay | Admitting: Primary Care

## 2016-01-07 NOTE — Telephone Encounter (Signed)
Done. Called patient and was able to schedule appt.

## 2016-01-07 NOTE — Telephone Encounter (Signed)
Patient returned Chan's call. °

## 2016-04-10 ENCOUNTER — Ambulatory Visit: Payer: BLUE CROSS/BLUE SHIELD | Admitting: Primary Care

## 2016-05-29 ENCOUNTER — Ambulatory Visit (INDEPENDENT_AMBULATORY_CARE_PROVIDER_SITE_OTHER): Payer: BLUE CROSS/BLUE SHIELD | Admitting: Primary Care

## 2016-05-29 ENCOUNTER — Encounter: Payer: Self-pay | Admitting: Primary Care

## 2016-05-29 VITALS — BP 130/80 | HR 86 | Temp 98.2°F | Ht <= 58 in | Wt 170.8 lb

## 2016-05-29 DIAGNOSIS — E785 Hyperlipidemia, unspecified: Secondary | ICD-10-CM

## 2016-05-29 DIAGNOSIS — E119 Type 2 diabetes mellitus without complications: Secondary | ICD-10-CM | POA: Diagnosis not present

## 2016-05-29 DIAGNOSIS — Z23 Encounter for immunization: Secondary | ICD-10-CM | POA: Diagnosis not present

## 2016-05-29 DIAGNOSIS — R030 Elevated blood-pressure reading, without diagnosis of hypertension: Secondary | ICD-10-CM | POA: Diagnosis not present

## 2016-05-29 DIAGNOSIS — IMO0001 Reserved for inherently not codable concepts without codable children: Secondary | ICD-10-CM

## 2016-05-29 NOTE — Assessment & Plan Note (Signed)
Initiated on Lipitor 40 mg last visit, does experience myalgias. Will obtain lipids today and consider changing to Crestor. LFT's pending.

## 2016-05-29 NOTE — Addendum Note (Signed)
Addended by: Tawnya Crook on: 05/29/2016 08:50 AM   Modules accepted: Orders

## 2016-05-29 NOTE — Patient Instructions (Signed)
Schedule a lab only appointment early next week for your labs. Ensure you come fasting 4 hours prior to this appointment.  Continue to work on improvements in your diet by limiting fried foods, fast foods, fatty foods. Increase vegetables, whole grains, fruits, lean protein.  Start exercising. You should be getting 150 minutes of moderate intensity exercise weekly.  You were provided with an influenza and pneumonia vaccination today.  Follow up in 6 months for your annual physical exam and re-evaluation.  It was a pleasure to see you today!  Diabetes Mellitus and Food It is important for you to manage your blood sugar (glucose) level. Your blood glucose level can be greatly affected by what you eat. Eating healthier foods in the appropriate amounts throughout the day at about the same time each day will help you control your blood glucose level. It can also help slow or prevent worsening of your diabetes mellitus. Healthy eating may even help you improve the level of your blood pressure and reach or maintain a healthy weight.  General recommendations for healthful eating and cooking habits include:  Eating meals and snacks regularly. Avoid going long periods of time without eating to lose weight.  Eating a diet that consists mainly of plant-based foods, such as fruits, vegetables, nuts, legumes, and whole grains.  Using low-heat cooking methods, such as baking, instead of high-heat cooking methods, such as deep frying. Work with your dietitian to make sure you understand how to use the Nutrition Facts information on food labels. HOW CAN FOOD AFFECT ME? Carbohydrates Carbohydrates affect your blood glucose level more than any other type of food. Your dietitian will help you determine how many carbohydrates to eat at each meal and teach you how to count carbohydrates. Counting carbohydrates is important to keep your blood glucose at a healthy level, especially if you are using insulin or taking  certain medicines for diabetes mellitus. Alcohol Alcohol can cause sudden decreases in blood glucose (hypoglycemia), especially if you use insulin or take certain medicines for diabetes mellitus. Hypoglycemia can be a life-threatening condition. Symptoms of hypoglycemia (sleepiness, dizziness, and disorientation) are similar to symptoms of having too much alcohol.  If your health care provider has given you approval to drink alcohol, do so in moderation and use the following guidelines:  Women should not have more than one drink per day, and men should not have more than two drinks per day. One drink is equal to:  12 oz of beer.  5 oz of wine.  1 oz of hard liquor.  Do not drink on an empty stomach.  Keep yourself hydrated. Have water, diet soda, or unsweetened iced tea.  Regular soda, juice, and other mixers might contain a lot of carbohydrates and should be counted. WHAT FOODS ARE NOT RECOMMENDED? As you make food choices, it is important to remember that all foods are not the same. Some foods have fewer nutrients per serving than other foods, even though they might have the same number of calories or carbohydrates. It is difficult to get your body what it needs when you eat foods with fewer nutrients. Examples of foods that you should avoid that are high in calories and carbohydrates but low in nutrients include:  Trans fats (most processed foods list trans fats on the Nutrition Facts label).  Regular soda.  Juice.  Candy.  Sweets, such as cake, pie, doughnuts, and cookies.  Fried foods. WHAT FOODS CAN I EAT? Eat nutrient-rich foods, which will nourish your body and  keep you healthy. The food you should eat also will depend on several factors, including:  The calories you need.  The medicines you take.  Your weight.  Your blood glucose level.  Your blood pressure level.  Your cholesterol level. You should eat a variety of foods, including:  Protein.  Lean cuts of  meat.  Proteins low in saturated fats, such as fish, egg whites, and beans. Avoid processed meats.  Fruits and vegetables.  Fruits and vegetables that may help control blood glucose levels, such as apples, mangoes, and yams.  Dairy products.  Choose fat-free or low-fat dairy products, such as milk, yogurt, and cheese.  Grains, bread, pasta, and rice.  Choose whole grain products, such as multigrain bread, whole oats, and brown rice. These foods may help control blood pressure.  Fats.  Foods containing healthful fats, such as nuts, avocado, olive oil, canola oil, and fish. DOES EVERYONE WITH DIABETES MELLITUS HAVE THE SAME MEAL PLAN? Because every person with diabetes mellitus is different, there is not one meal plan that works for everyone. It is very important that you meet with a dietitian who will help you create a meal plan that is just right for you.   This information is not intended to replace advice given to you by your health care provider. Make sure you discuss any questions you have with your health care provider.   Document Released: 05/28/2005 Document Revised: 09/21/2014 Document Reviewed: 07/28/2013 Elsevier Interactive Patient Education [employer]

## 2016-05-29 NOTE — Assessment & Plan Note (Addendum)
Managed on Metformin XR 500 mg once daily. Due for A1C recheck today. Discussed importance of improvements in diet and exercise. Pneumonia and influenza vaccination provided today. Urine microalbumin, A1C, lipids, CMP pending. Foot exam unremarkable today. Managed on ACE. Will await results. Follow up in 6 months.

## 2016-05-29 NOTE — Progress Notes (Signed)
Subjective:    Patient ID: Kelly Gregory, female    DOB: 08-15-1986, 30 y.o.   MRN: [id number]  HPI  Ms. Kelly Gregory is a 30 year old female who presents today for follow up.  1) Type 2 Diabetes: Currently managed on Metformin XR 500 mg once daily. A1C in April 2017 at 6.5, she is due for recheck today. Denies GI upset. She does experiences diarrhea once weekly which is tolerable. She's been working to make improvements in her diet.   Her diet currently consists of: Breakfast: Protein shakes Lunch: Chicken, vegetables Dinner: Chicken, hamburger, fast food Snacks: Nuts, granola bars Desserts: Occasionally Beverages: Water, coffee, reduction in soda (3 weekly)  Exercise: She does not currently exercise  Wt Readings from Last 3 Encounters:  05/29/16 170 lb 12.8 oz (77.5 kg)  10/04/15 166 lb 1.9 oz (75.4 kg)  08/23/15 166 lb 6.4 oz (75.5 kg)     2) Hyperlipidemia: Currently managed on Lipitor 40 mg. Lipid panel in April 2017 above goal and was initiated on Atorvastatin at that time. She was recommend to come back 3 months later for re-evaluation but did not do so at that time. She has noticed muscle aches to her hands 1-2 times weekly which is bothersome. She does have a history of RA but this was not bothersome prior to initiation of Atorvastatin.  Review of Systems  Respiratory: Negative for shortness of breath.   Cardiovascular: Negative for chest pain.  Gastrointestinal: Positive for diarrhea. Negative for abdominal pain and nausea.  Musculoskeletal: Positive for myalgias.  Neurological: Negative for dizziness, numbness and headaches.       Past Medical History:  Diagnosis Date  . Borderline diabetes   . Elevated blood pressure   . Gestational diabetes   . Rheumatoid arthritis (HCC)    Years ago, does not follow.  . Syncope    As a child, no problems during adulthood.     Social History   Social History  . Marital status: Married    Spouse name: N/A  . Number of  children: N/A  . Years of education: N/A   Occupational History  . Not on file.   Social History Main Topics  . Smoking status: Never Smoker  . Smokeless tobacco: Not on file  . Alcohol use No  . Drug use: No  . Sexual activity: Yes    Birth control/ protection: Pill   Other Topics Concern  . Not on file   Social History Narrative   Married.   2 children.   Works as a Paramedic.   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    Past Surgical History:  Procedure Laterality Date  . CESAREAN SECTION  04/01/2011   Procedure: CESAREAN SECTION;  Surgeon: Oliver Pila;  Location: [facility];  Service: Gynecology;  Laterality: N/A;  . CESAREAN SECTION N/A 05/23/2013   Procedure: CESAREAN SECTION;  Surgeon: Lavina Hamman, MD;  Location: [facility];  Service: Obstetrics;  Laterality: N/A;  . CYSTECTOMY    . TOOTH EXTRACTION      Family History  Problem Relation Age of Onset  . Adopted: Yes  . Other      Adopted    Allergies  Allergen Reactions  . Azithromycin Nausea And Vomiting  . Tequin Nausea And Vomiting  . Penicillins Hives    Current Outpatient Prescriptions on File Prior to Visit  Medication Sig Dispense Refill  . atorvastatin (LIPITOR) 40 MG tablet Take 1 tablet (40 mg total) by mouth  daily. 30 tablet 3  . metFORMIN (GLUCOPHAGE XR) 500 MG 24 hr tablet Take 1 tablet (500 mg total) by mouth daily with breakfast. 30 tablet 3  . norethindrone (MICRONOR,CAMILA,ERRIN) 0.35 MG tablet Take 1 tablet by mouth daily.     No current facility-administered medications on file prior to visit.     BP 130/80   Pulse 86   Temp 98.2 F (36.8 C) (Oral)   Ht 4\' 9"  (1.448 m)   Wt 170 lb 12.8 oz (77.5 kg)   LMP 05/10/2016   SpO2 99%   BMI 36.96 kg/m    Objective:   Physical Exam  Constitutional: She appears well-nourished.  Neck: Neck supple.  Cardiovascular: Normal rate and regular rhythm.   Pulmonary/Chest: Effort normal and breath sounds normal.  Skin: Skin is warm  and dry.          Assessment & Plan:

## 2016-05-29 NOTE — Assessment & Plan Note (Signed)
Stable in the office today, will continue to monitor.

## 2016-05-29 NOTE — Progress Notes (Signed)
Pre visit review using our clinic review tool, if applicable. No additional management support is needed unless otherwise documented below in the visit note. 

## 2016-06-05 ENCOUNTER — Other Ambulatory Visit (INDEPENDENT_AMBULATORY_CARE_PROVIDER_SITE_OTHER): Payer: BLUE CROSS/BLUE SHIELD

## 2016-06-05 ENCOUNTER — Other Ambulatory Visit: Payer: Self-pay | Admitting: Primary Care

## 2016-06-05 DIAGNOSIS — E119 Type 2 diabetes mellitus without complications: Secondary | ICD-10-CM

## 2016-06-05 DIAGNOSIS — E785 Hyperlipidemia, unspecified: Secondary | ICD-10-CM

## 2016-06-05 LAB — LIPID PANEL
Cholesterol: 173 mg/dL (ref 0–200)
HDL: 35.4 mg/dL — AB (ref >39.00)
LDL CALC: 102 mg/dL — AB (ref 0–99)
NONHDL: 137.7
Total CHOL/HDL Ratio: 5
Triglycerides: 181 mg/dL — ABNORMAL HIGH (ref 0.0–149.0)
VLDL: 36.2 mg/dL (ref 0.0–40.0)

## 2016-06-05 LAB — COMPREHENSIVE METABOLIC PANEL
ALT: 85 U/L — ABNORMAL HIGH (ref 0–35)
AST: 41 U/L — AB (ref 0–37)
Albumin: 3.5 g/dL (ref 3.5–5.2)
Alkaline Phosphatase: 94 U/L (ref 39–117)
BUN: 10 mg/dL (ref 6–23)
CO2: 27 meq/L (ref 19–32)
CREATININE: 0.67 mg/dL (ref 0.40–1.20)
Calcium: 8.6 mg/dL (ref 8.4–10.5)
Chloride: 100 mEq/L (ref 96–112)
GFR: 108.16 mL/min (ref >60.00)
GLUCOSE: 101 mg/dL — AB (ref 70–99)
POTASSIUM: 4 meq/L (ref 3.5–5.1)
SODIUM: 133 meq/L — AB (ref 135–145)
Total Bilirubin: 0.4 mg/dL (ref 0.2–1.2)
Total Protein: 7.5 g/dL (ref 6.0–8.3)

## 2016-06-05 LAB — MICROALBUMIN / CREATININE URINE RATIO
Creatinine,U: 5.4 mg/dL
Microalb Creat Ratio: 13.1 mg/g (ref 0.0–30.0)

## 2016-06-05 LAB — HEMOGLOBIN A1C: Hgb A1c MFr Bld: 6.5 % (ref 4.6–6.5)

## 2016-06-05 MED ORDER — ROSUVASTATIN CALCIUM 10 MG PO TABS: 10.0000 mg | ORAL_TABLET | Freq: Every day | ORAL | 3 refills | Status: DC

## 2016-07-17 DIAGNOSIS — Z01419 Encounter for gynecological examination (general) (routine) without abnormal findings: Secondary | ICD-10-CM | POA: Diagnosis not present

## 2016-07-17 DIAGNOSIS — Z6837 Body mass index (BMI) 37.0-37.9, adult: Secondary | ICD-10-CM | POA: Diagnosis not present

## 2016-07-17 DIAGNOSIS — Z1389 Encounter for screening for other disorder: Secondary | ICD-10-CM | POA: Diagnosis not present

## 2016-07-17 DIAGNOSIS — Z130 Encounter for screening for diseases of the blood and blood-forming organs and certain disorders involving the immune mechanism: Secondary | ICD-10-CM | POA: Diagnosis not present

## 2016-08-14 ENCOUNTER — Other Ambulatory Visit: Payer: Self-pay | Admitting: Primary Care

## 2016-08-14 DIAGNOSIS — E119 Type 2 diabetes mellitus without complications: Secondary | ICD-10-CM

## 2016-08-14 MED ORDER — METFORMIN HCL ER 500 MG PO TB24: 500.0000 mg | ORAL_TABLET | Freq: Every day | ORAL | 1 refills | Status: DC

## 2016-08-14 NOTE — Telephone Encounter (Signed)
Received faxed refill request for   metFORMIN (GLUCOPHAGE XR) 500 MG 24 hr tablet  Last prescribed on 01/03/2016. Last seen on 05/29/2016.  Will refill as requested.

## 2016-08-27 ENCOUNTER — Ambulatory Visit (INDEPENDENT_AMBULATORY_CARE_PROVIDER_SITE_OTHER): Payer: BLUE CROSS/BLUE SHIELD | Admitting: Family Medicine

## 2016-08-27 ENCOUNTER — Ambulatory Visit (INDEPENDENT_AMBULATORY_CARE_PROVIDER_SITE_OTHER): Payer: BLUE CROSS/BLUE SHIELD

## 2016-08-27 VITALS — BP 127/85 | HR 106 | Temp 98.4°F | Resp 14 | Wt 172.0 lb

## 2016-08-27 DIAGNOSIS — H1033 Unspecified acute conjunctivitis, bilateral: Secondary | ICD-10-CM | POA: Diagnosis not present

## 2016-08-27 DIAGNOSIS — S93401A Sprain of unspecified ligament of right ankle, initial encounter: Secondary | ICD-10-CM | POA: Diagnosis not present

## 2016-08-27 DIAGNOSIS — M25471 Effusion, right ankle: Secondary | ICD-10-CM | POA: Diagnosis not present

## 2016-08-27 DIAGNOSIS — R059 Cough, unspecified: Secondary | ICD-10-CM | POA: Insufficient documentation

## 2016-08-27 DIAGNOSIS — R05 Cough: Secondary | ICD-10-CM

## 2016-08-27 DIAGNOSIS — H109 Unspecified conjunctivitis: Secondary | ICD-10-CM | POA: Insufficient documentation

## 2016-08-27 MED ORDER — HYDROCOD POLST-CPM POLST ER 10-8 MG/5ML PO SUER: 5.0000 mL | Freq: Two times a day (BID) | ORAL | 0 refills | Status: DC | PRN

## 2016-08-27 MED ORDER — POLYMYXIN B-TRIMETHOPRIM 10000-0.1 UNIT/ML-% OP SOLN: 1.0000 [drp] | Freq: Four times a day (QID) | OPHTHALMIC | 0 refills | Status: DC

## 2016-08-27 NOTE — Progress Notes (Signed)
Pre visit review using our clinic review tool, if applicable. No additional management support is needed unless otherwise documented below in the visit note. 

## 2016-08-27 NOTE — Assessment & Plan Note (Signed)
New problem. I suspect that this is likely viral, however given severity and discharge covering for bacterial cause with Polytrim.

## 2016-08-27 NOTE — Progress Notes (Signed)
Subjective:  Patient ID: Kelly Gregory, female    DOB: Oct 07, 1985  Age: 30 y.o. MRN: [id number]  CC: Ankle pain, Eye redness/rainage, Cough  HPI:  30 year old female presents with the above complaints.  Right ankle pain  Patient states that yesterday she was walking and twisted her ankle.  Inversion injury.  She subsequently developed swelling and pain around the lateral malleolus.  Pain is continued to persist and has been worsening. Pain is moderate. She reports pain with range of motion.  No known relieving factors.  No other associated symptoms.  Eye redness/drainage  Patient reports that she's had eye redness, burning and drainage since Monday.  She reports matting of the eyelashes.  She has been exposed to several sick contacts at home.  She reports associated photophobia.  No known exacerbating or relieving factors.  Cough  Started last Friday.  Dry cough.  Moderate in severity.  She has had sick contacts at home.  No associated fevers or chills.  No known exacerbating or relieving factors.  Social Hx   Social History   Social History  . Marital status: Married    Spouse name: N/A  . Number of children: N/A  . Years of education: N/A   Social History Main Topics  . Smoking status: Never Smoker  . Smokeless tobacco: Not on file  . Alcohol use No  . Drug use: No  . Sexual activity: Yes    Birth control/ protection: Pill   Other Topics Concern  . Not on file   Social History Narrative   Married.   2 children.   Works as a Paramedic.   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    Review of Systems  Constitutional: Negative for fever.  Eyes: Positive for photophobia, pain, discharge and redness.  Respiratory: Positive for cough.   Musculoskeletal:       Right ankle pain.   Objective:  BP 127/85 (BP Location: Left Arm, Patient Position: Sitting, Cuff Size: Normal)   Pulse (!) 106   Temp 98.4 F (36.9 C) (Oral)   Resp 14    Wt 172 lb (78 kg)   SpO2 92%   BMI 37.22 kg/m   BP/Weight 08/27/2016 05/29/2016 10/04/2015  Systolic BP 127 130 132  Diastolic BP 85 80 82  Wt. (Lbs) 172 170.8 166.12  BMI 37.22 36.96 35.94   Physical Exam  Constitutional: She appears well-developed. No distress.  HENT:  Mouth/Throat: Oropharynx is clear and moist.  Eyes:  Conjunctival injection noted bilaterally. Slightly photophobic. Evidence of matting/drainage noted.  Cardiovascular: Normal rate and regular rhythm.   Pulmonary/Chest: Effort normal and breath sounds normal.  Musculoskeletal:  Ankle: Right Visible swelling noted at the lateral malleolus. Patient exquisitely tender to palpation at the lateral malleolus. Decreased range of motion particularly in flexion and extension. No erythema.   Vitals reviewed.  Lab Results  Component Value Date   WBC 8.0 09/27/2015   HGB 15.0 09/27/2015   HCT 44.8 09/27/2015   PLT 344.0 09/27/2015   GLUCOSE 101 (H) 06/05/2016   CHOL 173 06/05/2016   TRIG 181.0 (H) 06/05/2016   HDL 35.40 (L) 06/05/2016   LDLDIRECT 183.0 01/03/2016   LDLCALC 102 (H) 06/05/2016   ALT 85 (H) 06/05/2016   AST 41 (H) 06/05/2016   NA 133 (L) 06/05/2016   K 4.0 06/05/2016   CL 100 06/05/2016   CREATININE 0.67 06/05/2016   BUN 10 06/05/2016   CO2 27 06/05/2016   TSH 1.63 09/27/2015  HGBA1C 6.5 06/05/2016   MICROALBUR <0.7 06/05/2016    Assessment & Plan:   Problem List Items Addressed This Visit    Sprain of right ankle - Primary    New problem. Meets criteria for imaging per [employer]. Xray today. Advised RICE and PRN NSAID's.      Relevant Orders   DG Ankle Complete Right   Cough    New problem. Likely viral in origin. Treating with tussionex.       Conjunctivitis    New problem. I suspect that this is likely viral, however given severity and discharge covering for bacterial cause with Polytrim.         Meds ordered this encounter  Medications  .  chlorpheniramine-HYDROcodone (TUSSIONEX PENNKINETIC ER) 10-8 MG/5ML SUER    Sig: Take 5 mLs by mouth every 12 (twelve) hours as needed.    Dispense:  115 mL    Refill:  0  . trimethoprim-polymyxin b (POLYTRIM) ophthalmic solution    Sig: Place 1 drop into both eyes every 6 (six) hours. For 5-7 days.    Dispense:  10 mL    Refill:  0    Follow-up: PRN  Everlene Other DO [facility]

## 2016-08-27 NOTE — Assessment & Plan Note (Signed)
New problem. Likely viral in origin. Treating with tussionex.

## 2016-08-27 NOTE — Patient Instructions (Signed)
Use the cough medication as prescribed.  Apply the drops as directed.  We will call with the results of your xray. Rest, Ice, Elevation. Motrin/Aleve as needed for pain.  Follow up as needed  Take care  Dr. Adriana Simas

## 2016-08-27 NOTE — Assessment & Plan Note (Signed)
New problem. Meets criteria for imaging per [employer]. Xray today. Advised RICE and PRN NSAID's.

## 2016-10-07 ENCOUNTER — Ambulatory Visit: Payer: BLUE CROSS/BLUE SHIELD | Admitting: Primary Care

## 2016-10-07 ENCOUNTER — Encounter: Payer: Self-pay | Admitting: Primary Care

## 2016-10-07 ENCOUNTER — Ambulatory Visit (INDEPENDENT_AMBULATORY_CARE_PROVIDER_SITE_OTHER): Payer: BLUE CROSS/BLUE SHIELD | Admitting: Primary Care

## 2016-10-07 VITALS — BP 122/82 | HR 74 | Temp 98.1°F | Ht <= 58 in | Wt 175.8 lb

## 2016-10-07 DIAGNOSIS — L03039 Cellulitis of unspecified toe: Secondary | ICD-10-CM | POA: Diagnosis not present

## 2016-10-07 MED ORDER — SULFAMETHOXAZOLE-TRIMETHOPRIM 800-160 MG PO TABS: 1.0000 | ORAL_TABLET | Freq: Two times a day (BID) | ORAL | 0 refills | Status: DC

## 2016-10-07 NOTE — Patient Instructions (Signed)
Start Bactrim DS (sulfamethoxazole/trimethoprim) tablets for toe infection. Take 1 tablet by mouth twice daily for 7 days.  I recommend you see a podiatrist in the future if you continue to notice recurrent ingrown toe nails.  Please notify me if no improvement in 3-4 days.  It was a pleasure to see you today!

## 2016-10-07 NOTE — Progress Notes (Signed)
Subjective:    Patient ID: Kelly Gregory, female    DOB: [birth year], 31 y.o.   MRN: [id number]  HPI  Ms. Rossner is a 31 year old female who presents today with a chief complaint of toe pain and redness. Her pain is located to the right great toe which began 1 week ago. She has a history of ingrown toe nails but has not experienced one like this before. She was digging at the site of the toe nail last week. She's noticed a pocket of puss underneath the toenail. Her pain is worse with walking. She denies fevers, increased redness, radiation of pain.   Review of Systems  Constitutional: Negative for fever.  Skin: Positive for color change and wound.  Neurological: Negative for weakness.       Past Medical History:  Diagnosis Date  . Borderline diabetes   . Elevated blood pressure   . Gestational diabetes   . Rheumatoid arthritis (HCC)    Years ago, does not follow.  . Syncope    As a child, no problems during adulthood.     Social History   Social History  . Marital status: Married    Spouse name: N/A  . Number of children: N/A  . Years of education: N/A   Occupational History  . Not on file.   Social History Main Topics  . Smoking status: Never Smoker  . Smokeless tobacco: Never Used  . Alcohol use No  . Drug use: No  . Sexual activity: Yes    Birth control/ protection: Pill   Other Topics Concern  . Not on file   Social History Narrative   Married.   2 children.   Works as a Paramedic.   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    Past Surgical History:  Procedure Laterality Date  . CESAREAN SECTION  04/01/2011   Procedure: CESAREAN SECTION;  Surgeon: Oliver Pila;  Location: [facility];  Service: Gynecology;  Laterality: N/A;  . CESAREAN SECTION N/A 05/23/2013   Procedure: CESAREAN SECTION;  Surgeon: Lavina Hamman, MD;  Location: [facility];  Service: Obstetrics;  Laterality: N/A;  . CYSTECTOMY    . TOOTH EXTRACTION      Family History  Problem  Relation Age of Onset  . Adopted: Yes  . Other      Adopted    Allergies  Allergen Reactions  . Azithromycin Nausea And Vomiting  . Tequin Nausea And Vomiting  . Penicillins Hives    Current Outpatient Prescriptions on File Prior to Visit  Medication Sig Dispense Refill  . metFORMIN (GLUCOPHAGE XR) 500 MG 24 hr tablet Take 1 tablet (500 mg total) by mouth daily with breakfast. 90 tablet 1  . norethindrone (MICRONOR,CAMILA,ERRIN) 0.35 MG tablet Take 1 tablet by mouth daily.    . rosuvastatin (CRESTOR) 10 MG tablet Take 1 tablet (10 mg total) by mouth daily. 90 tablet 3   No current facility-administered medications on file prior to visit.     BP 122/82   Pulse 74   Temp 98.1 F (36.7 C) (Oral)   Ht 4\' 9"  (1.448 m)   Wt 175 lb 12.8 oz (79.7 kg)   LMP 09/06/2016   SpO2 97%   BMI 38.04 kg/m    Objective:   Physical Exam  Constitutional: She appears well-nourished.  Neck: Neck supple.  Cardiovascular: Normal rate and regular rhythm.   Pulmonary/Chest: Effort normal and breath sounds normal.  Skin: There is erythema.  Moderate erythema to  right great toe, warm to touch. Whitish drainage to right medial toe nail border. Tender to touch.          Assessment & Plan:  Toe infection:  Located to right great toe, likely secondary to ingrown toe nail. Exam today with moderate erythema to entire right great toe with drainage. Obvious infection. Discussed podiatry referral for permanent removal of ingrown toe nails. Rx for Bactrim DS tablets sent to pharmacy. Discussed to soak in warm water. She will follow up if no improvement.  Morrie Sheldon, NP d

## 2016-10-07 NOTE — Progress Notes (Signed)
Pre visit review using our clinic review tool, if applicable. No additional management support is needed unless otherwise documented below in the visit note. 

## 2016-10-25 ENCOUNTER — Ambulatory Visit (HOSPITAL_COMMUNITY)
Admission: EM | Admit: 2016-10-25 | Discharge: 2016-10-25 | Disposition: A | Discharge status: Home / Self Care | Payer: BLUE CROSS/BLUE SHIELD | Attending: Physician Assistant | Admitting: Physician Assistant

## 2016-10-25 ENCOUNTER — Telehealth (HOSPITAL_COMMUNITY): Payer: Self-pay | Admitting: Emergency Medicine

## 2016-10-25 ENCOUNTER — Encounter (HOSPITAL_COMMUNITY): Payer: Self-pay | Admitting: Emergency Medicine

## 2016-10-25 DIAGNOSIS — R51 Headache: Secondary | ICD-10-CM | POA: Diagnosis present

## 2016-10-25 DIAGNOSIS — Z7984 Long term (current) use of oral hypoglycemic drugs: Secondary | ICD-10-CM | POA: Insufficient documentation

## 2016-10-25 DIAGNOSIS — Z880 Allergy status to penicillin: Secondary | ICD-10-CM | POA: Diagnosis not present

## 2016-10-25 DIAGNOSIS — J111 Influenza due to unidentified influenza virus with other respiratory manifestations: Secondary | ICD-10-CM | POA: Insufficient documentation

## 2016-10-25 DIAGNOSIS — Z79899 Other long term (current) drug therapy: Secondary | ICD-10-CM | POA: Insufficient documentation

## 2016-10-25 DIAGNOSIS — L600 Ingrowing nail: Secondary | ICD-10-CM | POA: Diagnosis not present

## 2016-10-25 DIAGNOSIS — R7303 Prediabetes: Secondary | ICD-10-CM | POA: Diagnosis not present

## 2016-10-25 DIAGNOSIS — R000 Tachycardia, unspecified: Secondary | ICD-10-CM | POA: Insufficient documentation

## 2016-10-25 LAB — POCT RAPID STREP A: STREPTOCOCCUS, GROUP A SCREEN (DIRECT): NEGATIVE

## 2016-10-25 MED ORDER — IBUPROFEN 800 MG PO TABS
800.0000 mg | ORAL_TABLET | Freq: Once | ORAL | Status: AC
  Administered 2016-10-25: 800 mg via ORAL

## 2016-10-25 MED ORDER — OSELTAMIVIR PHOSPHATE 75 MG PO CAPS: 75.0000 mg | ORAL_CAPSULE | Freq: Two times a day (BID) | ORAL | 0 refills | Status: DC

## 2016-10-25 MED ORDER — IBUPROFEN 800 MG PO TABS
ORAL_TABLET | ORAL | Status: AC
  Filled 2016-10-25: qty 1

## 2016-10-25 NOTE — ED Provider Notes (Signed)
CSN: [id number]     Arrival date & time 10/25/16  1204 History   First MD Initiated Contact with Patient 10/25/16 1234     Chief Complaint  Patient presents with  . Headache  . Nail Problem   (Consider location/radiation/quality/duration/timing/severity/associated sxs/prior Treatment)   31 yo presents with 8 hours of headaches, chills, fever, dry cough and diarrhea x 2 . She had 2 Excedrin prior to this visit. Reports having flu vaccine. Denies sore throat or urinary symptoms. She also has a right ingrown toe nail and just completed abx for this by her PCP. She has not seen a podiatrist yet, but states it is getting better.       Past Medical History:  Diagnosis Date  . Borderline diabetes   . Elevated blood pressure   . Gestational diabetes   . Rheumatoid arthritis (HCC)    Years ago, does not follow.  . Syncope    As a child, no problems during adulthood.   Past Surgical History:  Procedure Laterality Date  . CESAREAN SECTION  04/01/2011   Procedure: CESAREAN SECTION;  Surgeon: Oliver Pila;  Location: [facility];  Service: Gynecology;  Laterality: N/A;  . CESAREAN SECTION N/A 05/23/2013   Procedure: CESAREAN SECTION;  Surgeon: Lavina Hamman, MD;  Location: [facility];  Service: Obstetrics;  Laterality: N/A;  . CYSTECTOMY    . TOOTH EXTRACTION     Family History  Problem Relation Age of Onset  . Adopted: Yes  . Other      Adopted   Social History  Substance Use Topics  . Smoking status: Never Smoker  . Smokeless tobacco: Never Used  . Alcohol use No   OB History    Gravida Para Term Preterm AB Living   3 1 1   1 2    SAB TAB Ectopic Multiple Live Births   1       1     Review of Systems  Constitutional: Positive for fatigue and fever.  HENT: Negative.   Eyes: Negative.   Respiratory: Positive for cough. Negative for shortness of breath and wheezing.   Gastrointestinal: Negative.   Genitourinary: Negative.   Musculoskeletal: Positive for myalgias.  Skin:  Negative for rash.  Allergic/Immunologic: Negative.   Neurological: Positive for headaches. Negative for seizures, weakness and light-headedness.  Psychiatric/Behavioral: Negative.     Allergies  Azithromycin; Tequin; and Penicillins  Home Medications   Prior to Admission medications   Medication Sig Start Date End Date Taking? Authorizing Provider  metFORMIN (GLUCOPHAGE XR) 500 MG 24 hr tablet Take 1 tablet (500 mg total) by mouth daily with breakfast. 08/14/16  Yes 14/1/17, NP  rosuvastatin (CRESTOR) 10 MG tablet Take 1 tablet (10 mg total) by mouth daily. 06/05/16  Yes 06/07/16, NP  sulfamethoxazole-trimethoprim (BACTRIM DS,SEPTRA DS) 800-160 MG tablet Take 1 tablet by mouth 2 (two) times daily. 10/07/16  Yes 10/09/16, NP  oseltamivir (TAMIFLU) 75 MG capsule Take 1 capsule (75 mg total) by mouth 2 (two) times daily. 10/25/16   12/23/16, PA-C   Meds Ordered and Administered this Visit   Medications  ibuprofen (ADVIL,MOTRIN) tablet 800 mg (800 mg Oral Given 10/25/16 1312)    BP 114/84 (BP Location: Right Arm)   Pulse (!) 134   Temp 99.5 F (37.5 C) (Oral)   Resp 18   SpO2 97%  No data found.   Physical Exam  Constitutional: She is oriented to person, place, and time. She  appears well-developed and well-nourished. No distress.  Appears ill with chills  HENT:  Head: Normocephalic and atraumatic.  Right Ear: External ear normal.  Left Ear: External ear normal.  Nose: Nose normal.  Mild oropharynx injection without exudate  Eyes: EOM are normal. Pupils are equal, round, and reactive to light.  Neck: Normal range of motion. Neck supple.  Cardiovascular: Regular rhythm.   Tachy initially at 134-repeat at 120  Musculoskeletal: Normal range of motion.  Lymphadenopathy:    She has no cervical adenopathy.  Neurological: She is alert and oriented to person, place, and time. No cranial nerve deficit. She exhibits normal muscle tone. Coordination  normal.  Skin: Skin is warm and dry. No rash noted. She is not diaphoretic.  Psychiatric: Her behavior is normal.  Nursing note and vitals reviewed.   Urgent Care Course     Procedures (including critical care time)  Labs Review Labs Reviewed  POCT RAPID STREP A    Imaging Review No results found.   Visual Acuity Review  Right Eye Distance:   Left Eye Distance:   Bilateral Distance:    Right Eye Near:   Left Eye Near:    Bilateral Near:         MDM   1. Flu   2. Tachycardia   3. Ingrown toenail    1. Though she had the FLU vaccine, elected to treat based on overall presentation. Tamiflu given with instructions to hydrate, rest. FU if worsens.  2. Prior records reviewed with history of mild tachycardia in the past. Prior to this visit had Excedrin and chilling here. Most likely etiology, however emergent warnings given to patient to present to the ED if worsening palpitations or new emergent symptoms. Avoid Excedrin.  3. Instructed to f/u with [facility] as originally recommended.      Riki Sheer, PA-C 10/25/16 1345

## 2016-10-25 NOTE — ED Triage Notes (Signed)
The patient presented to the [facility] with a complaint of a headache with body aches and chills x 1 day.  The patient also complained on an in grown right great toe that has been treated at her PCP and she has been referred to a podiatrist and has not complied.

## 2016-10-25 NOTE — Discharge Instructions (Signed)
I think you have the FLU or a viral illness. Will cover you with Tamiflu in case this is the flu. Treat remainder of your symptoms symptomatolytically. Delsym is good for cough, Ibuprofen 600mg  for headaches and body aches. AVOID Excedrin in the setting of fast heart rate. Certainly if you start having worsening headaches or palpitations then please go to the ED for further evaluation. Otherwise hydrate and rest.  Please f/u with [facility] for your toe nail

## 2016-10-25 NOTE — Telephone Encounter (Signed)
[facility] reporting medication not at pharmacy.  After review of medical record, e -scription " failed" this nurse did call script into [facility] for generic tamiflu.spoke with pharmacist, did not leave a message.

## 2016-10-25 NOTE — ED Notes (Signed)
Spoke with michelle, pa about patient's work note.  Note prepared as instructed by michelle, pa

## 2016-10-27 LAB — CULTURE, GROUP A STREP (THRC)

## 2016-11-26 ENCOUNTER — Ambulatory Visit (INDEPENDENT_AMBULATORY_CARE_PROVIDER_SITE_OTHER): Payer: BLUE CROSS/BLUE SHIELD | Admitting: Primary Care

## 2016-11-26 ENCOUNTER — Encounter: Payer: Self-pay | Admitting: Primary Care

## 2016-11-26 VITALS — BP 118/80 | HR 81 | Temp 97.8°F | Ht <= 58 in | Wt 175.0 lb

## 2016-11-26 DIAGNOSIS — Z0000 Encounter for general adult medical examination without abnormal findings: Secondary | ICD-10-CM | POA: Diagnosis not present

## 2016-11-26 DIAGNOSIS — G43901 Migraine, unspecified, not intractable, with status migrainosus: Secondary | ICD-10-CM

## 2016-11-26 DIAGNOSIS — E785 Hyperlipidemia, unspecified: Secondary | ICD-10-CM

## 2016-11-26 DIAGNOSIS — E119 Type 2 diabetes mellitus without complications: Secondary | ICD-10-CM

## 2016-11-26 DIAGNOSIS — G43909 Migraine, unspecified, not intractable, without status migrainosus: Secondary | ICD-10-CM | POA: Insufficient documentation

## 2016-11-26 DIAGNOSIS — R030 Elevated blood-pressure reading, without diagnosis of hypertension: Secondary | ICD-10-CM

## 2016-11-26 LAB — COMPREHENSIVE METABOLIC PANEL
ALBUMIN: 3.5 g/dL (ref 3.5–5.2)
ALT: 116 U/L — AB (ref 0–35)
AST: 56 U/L — AB (ref 0–37)
Alkaline Phosphatase: 90 U/L (ref 39–117)
BUN: 9 mg/dL (ref 6–23)
CHLORIDE: 101 meq/L (ref 96–112)
CO2: 24 mEq/L (ref 19–32)
CREATININE: 0.58 mg/dL (ref 0.40–1.20)
Calcium: 9 mg/dL (ref 8.4–10.5)
GFR: 127.37 mL/min (ref >60.00)
Glucose, Bld: 129 mg/dL — ABNORMAL HIGH (ref 70–99)
Potassium: 4 mEq/L (ref 3.5–5.1)
SODIUM: 135 meq/L (ref 135–145)
Total Bilirubin: 0.4 mg/dL (ref 0.2–1.2)
Total Protein: 7.5 g/dL (ref 6.0–8.3)

## 2016-11-26 LAB — LIPID PANEL
CHOL/HDL RATIO: 6
CHOLESTEROL: 206 mg/dL — AB (ref 0–200)
HDL: 32.4 mg/dL — ABNORMAL LOW (ref >39.00)
NonHDL: 173.51
Triglycerides: 228 mg/dL — ABNORMAL HIGH (ref 0.0–149.0)
VLDL: 45.6 mg/dL — ABNORMAL HIGH (ref 0.0–40.0)

## 2016-11-26 LAB — LDL CHOLESTEROL, DIRECT: Direct LDL: 133 mg/dL

## 2016-11-26 LAB — HEMOGLOBIN A1C: Hgb A1c MFr Bld: 7.1 % — ABNORMAL HIGH (ref 4.6–6.5)

## 2016-11-26 NOTE — Assessment & Plan Note (Signed)
Intermittent since December. Suspect to be stress related. Discussed to work on stress. She will be taking FMLA in April for foster children. Continue to monitor.

## 2016-11-26 NOTE — Assessment & Plan Note (Signed)
Managed on Crestor 10 mg.  Lipid panel and LFT's pending.

## 2016-11-26 NOTE — Assessment & Plan Note (Signed)
Compliant to Metformin, A1C pending. Discussed the importance of a healthy diet and regular exercise in order for weight loss, and to reduce the risk of other medical diseases.

## 2016-11-26 NOTE — Assessment & Plan Note (Signed)
Stable toady, continue to monitor. Stressed importance of healthy diet and regular exercise.

## 2016-11-26 NOTE — Patient Instructions (Addendum)
Headaches are likely stress induced. You must work to reduce stress levels.  Do not exceed 3000 mg of tylenol or 2400 mg of ibuprofen in 24 hours.  Complete lab work prior to leaving today. I will notify you of your results once received.   It's important to improve your diet by increase consumption of fresh vegetables and fruits, whole grains, water. Try to eat more meals during the day.  Ensure you are drinking 64 ounces of water daily.  Start exercising. You should be getting 150 minutes of moderate intensity exercise weekly.  It was a pleasure to see you today!

## 2016-11-26 NOTE — Progress Notes (Signed)
Pre visit review using our clinic review tool, if applicable. No additional management support is needed unless otherwise documented below in the visit note. 

## 2016-11-26 NOTE — Progress Notes (Signed)
Subjective:    Patient ID: Kelly Gregory, female    DOB: September 10, 1986, 31 y.o.   MRN: [id number]  HPI  Kelly Gregory is a 31 year old female who presents today for complete physical.  1) Headaches: Located to temporal and parietal region of head that has been present intermittently since December 2017. These will occur once to twice weekly. She will experience photophobia, phonophobia, and nausea. She's been taking tylenol with improvement. She's under an increased amount of stress as she Foster's several children.   Immunizations: -Tetanus: Completed in 2013 -Influenza: Completed in Fall 2017   Diet: She endorses a poor diet. Breakfast: Water, coffee Lunch: Skips Dinner: Chicken, vegetables, pasta Snacks: Crackers Desserts: Occasionally  Beverages: Regular soda, coffee, water (2 large bottles)  Exercise: She does not currently exercising. Eye exam: Completed over 1 year ago. Dental exam: Completes annually Pap Smear: Completed in 2016   Review of Systems  Constitutional: Negative for unexpected weight change.  HENT: Negative for rhinorrhea.   Eyes: Positive for photophobia.  Respiratory: Negative for cough and shortness of breath.   Cardiovascular: Negative for chest pain.  Gastrointestinal: Positive for nausea. Negative for constipation and diarrhea.  Genitourinary: Negative for difficulty urinating and menstrual problem.  Musculoskeletal: Negative for arthralgias and myalgias.  Skin: Negative for rash.  Allergic/Immunologic: Negative for environmental allergies.  Neurological: Positive for headaches. Negative for dizziness and numbness.  Psychiatric/Behavioral:       Under a lot of stress as she is adopting two foster children. Overall managing on her own.       Past Medical History:  Diagnosis Date  . Borderline diabetes   . Elevated blood pressure   . Gestational diabetes   . Rheumatoid arthritis (HCC)    Years ago, does not follow.  . Syncope    As a child, no  problems during adulthood.     Social History   Social History  . Marital status: Married    Spouse name: N/A  . Number of children: N/A  . Years of education: N/A   Occupational History  . Not on file.   Social History Main Topics  . Smoking status: Never Smoker  . Smokeless tobacco: Never Used  . Alcohol use No  . Drug use: No  . Sexual activity: Yes    Birth control/ protection: Pill   Other Topics Concern  . Not on file   Social History Narrative   Married.   2 children.   Works as a Paramedic.   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    Past Surgical History:  Procedure Laterality Date  . CESAREAN SECTION  04/01/2011   Procedure: CESAREAN SECTION;  Surgeon: Oliver Pila;  Location: [facility];  Service: Gynecology;  Laterality: N/A;  . CESAREAN SECTION N/A 05/23/2013   Procedure: CESAREAN SECTION;  Surgeon: Lavina Hamman, MD;  Location: [facility];  Service: Obstetrics;  Laterality: N/A;  . CYSTECTOMY    . TOOTH EXTRACTION      Family History  Problem Relation Age of Onset  . Adopted: Yes  . Other      Adopted    Allergies  Allergen Reactions  . Azithromycin Nausea And Vomiting  . Gatifloxacin Other (See Comments)  . Tequin Nausea And Vomiting  . Penicillins Hives and Rash    Current Outpatient Prescriptions on File Prior to Visit  Medication Sig Dispense Refill  . metFORMIN (GLUCOPHAGE XR) 500 MG 24 hr tablet Take 1 tablet (500 mg total) by  mouth daily with breakfast. 90 tablet 1  . rosuvastatin (CRESTOR) 10 MG tablet Take 1 tablet (10 mg total) by mouth daily. 90 tablet 3   No current facility-administered medications on file prior to visit.     BP 118/80   Pulse 81   Temp 97.8 F (36.6 C) (Oral)   Ht 4\' 9"  (1.448 m)   Wt 175 lb (79.4 kg)   LMP 11/23/2016   SpO2 98%   BMI 37.87 kg/m    Objective:   Physical Exam  Constitutional: She is oriented to person, place, and time. She appears well-nourished.  HENT:  Right Ear:  Tympanic membrane and ear canal normal.  Left Ear: Tympanic membrane and ear canal normal.  Nose: Nose normal.  Mouth/Throat: Oropharynx is clear and moist.  Eyes: Conjunctivae and EOM are normal. Pupils are equal, round, and reactive to light.  Neck: Neck supple. No thyromegaly present.  Cardiovascular: Normal rate and regular rhythm.   No murmur heard. Pulmonary/Chest: Effort normal and breath sounds normal. She has no rales.  Abdominal: Soft. Bowel sounds are normal. There is no tenderness.  Musculoskeletal: Normal range of motion.  Lymphadenopathy:    She has no cervical adenopathy.  Neurological: She is alert and oriented to person, place, and time. She has normal reflexes. No cranial nerve deficit.  Skin: Skin is warm and dry. No rash noted.  Psychiatric: She has a normal mood and affect.          Assessment & Plan:

## 2016-11-26 NOTE — Assessment & Plan Note (Signed)
Immunizations UTD. Pap UTD, follows with GYN. Discussed the importance of a healthy diet and regular exercise in order for weight loss, and to reduce the risk of other medical diseases. Exam unremarkable. Labs pending. Follow up in 1 year for annual exam.

## 2016-11-27 ENCOUNTER — Other Ambulatory Visit: Payer: Self-pay | Admitting: Primary Care

## 2016-11-27 DIAGNOSIS — E119 Type 2 diabetes mellitus without complications: Secondary | ICD-10-CM

## 2016-11-27 MED ORDER — METFORMIN HCL ER 500 MG PO TB24: 1000.0000 mg | ORAL_TABLET | Freq: Every day | ORAL | 1 refills | Status: DC

## 2016-12-03 ENCOUNTER — Telehealth: Payer: Self-pay | Admitting: Primary Care

## 2016-12-03 NOTE — Telephone Encounter (Signed)
Patient returned Chan's call about her lab work.  Patient said she did see her results in My chart.  Patient said she was fasting for her lab work.  Patient stopped taking Crestor in December.  Patient said she went back on the Crestor the same day she saw Jae Dire at her last visit.  Patient scheduled lab appointment on 02/26/17.

## 2016-12-03 NOTE — Telephone Encounter (Signed)
Noted, will recheck lipids in June.

## 2016-12-25 ENCOUNTER — Telehealth: Payer: Self-pay | Admitting: Primary Care

## 2016-12-25 NOTE — Telephone Encounter (Signed)
DSS forms dropped off. Left in RX tower. EH

## 2016-12-25 NOTE — Telephone Encounter (Signed)
Form completed and placed in Chan's inbox. Ready for pick up.

## 2016-12-25 NOTE — Telephone Encounter (Signed)
Place form in Kate's inbox. 

## 2016-12-28 NOTE — Telephone Encounter (Signed)
Message left for patient to return my call.  

## 2016-12-31 NOTE — Telephone Encounter (Signed)
Spoken to patient and she was notified that form has been completed. Left in the front office.  Patient verbalized understanding.

## 2017-02-16 ENCOUNTER — Other Ambulatory Visit: Payer: Self-pay | Admitting: Primary Care

## 2017-02-16 DIAGNOSIS — E785 Hyperlipidemia, unspecified: Secondary | ICD-10-CM

## 2017-02-16 DIAGNOSIS — E119 Type 2 diabetes mellitus without complications: Secondary | ICD-10-CM

## 2017-02-22 ENCOUNTER — Telehealth: Payer: Self-pay | Admitting: Primary Care

## 2017-02-22 NOTE — Telephone Encounter (Signed)
Pt dropped off [employer]/short term disability forms to be filled out  For adoption of her 3 foster children.  She wanted from 02/19/17 for 3 months  In kates in box For review and signature

## 2017-02-24 NOTE — Telephone Encounter (Signed)
Signed and placed on Robin's desk. 

## 2017-02-25 NOTE — Telephone Encounter (Signed)
Pt aware paperwork is ready for pick up She will turn in herself Copy for file Copy for pt Copy for scan

## 2017-02-26 ENCOUNTER — Other Ambulatory Visit (INDEPENDENT_AMBULATORY_CARE_PROVIDER_SITE_OTHER): Payer: BLUE CROSS/BLUE SHIELD

## 2017-02-26 DIAGNOSIS — E119 Type 2 diabetes mellitus without complications: Secondary | ICD-10-CM

## 2017-02-26 DIAGNOSIS — E785 Hyperlipidemia, unspecified: Secondary | ICD-10-CM | POA: Diagnosis not present

## 2017-02-26 LAB — HEPATIC FUNCTION PANEL
ALBUMIN: 3.5 g/dL (ref 3.5–5.2)
ALT: 121 U/L — AB (ref 0–35)
AST: 51 U/L — ABNORMAL HIGH (ref 0–37)
Alkaline Phosphatase: 82 U/L (ref 39–117)
BILIRUBIN DIRECT: 0.1 mg/dL (ref 0.0–0.3)
TOTAL PROTEIN: 7.2 g/dL (ref 6.0–8.3)
Total Bilirubin: 0.3 mg/dL (ref 0.2–1.2)

## 2017-02-26 LAB — LIPID PANEL
CHOL/HDL RATIO: 5
Cholesterol: 165 mg/dL (ref 0–200)
HDL: 34.6 mg/dL — ABNORMAL LOW (ref >39.00)
LDL CALC: 93 mg/dL (ref 0–99)
NONHDL: 130.66
TRIGLYCERIDES: 188 mg/dL — AB (ref 0.0–149.0)
VLDL: 37.6 mg/dL (ref 0.0–40.0)

## 2017-02-26 LAB — HEMOGLOBIN A1C: Hgb A1c MFr Bld: 7.1 % — ABNORMAL HIGH (ref 4.6–6.5)

## 2017-06-13 ENCOUNTER — Other Ambulatory Visit: Payer: Self-pay | Admitting: Primary Care

## 2017-06-13 DIAGNOSIS — E119 Type 2 diabetes mellitus without complications: Secondary | ICD-10-CM

## 2017-12-12 ENCOUNTER — Other Ambulatory Visit: Payer: Self-pay | Admitting: Primary Care

## 2017-12-12 DIAGNOSIS — E119 Type 2 diabetes mellitus without complications: Secondary | ICD-10-CM

## 2018-01-06 DIAGNOSIS — O0991 Supervision of high risk pregnancy, unspecified, first trimester: Secondary | ICD-10-CM | POA: Diagnosis not present

## 2018-01-06 DIAGNOSIS — Z368A Encounter for antenatal screening for other genetic defects: Secondary | ICD-10-CM | POA: Diagnosis not present

## 2018-01-06 DIAGNOSIS — N912 Amenorrhea, unspecified: Secondary | ICD-10-CM | POA: Diagnosis not present

## 2018-01-06 DIAGNOSIS — Z3201 Encounter for pregnancy test, result positive: Secondary | ICD-10-CM | POA: Diagnosis not present

## 2018-01-10 DIAGNOSIS — O26891 Other specified pregnancy related conditions, first trimester: Secondary | ICD-10-CM | POA: Diagnosis not present

## 2018-01-10 DIAGNOSIS — Z3A10 10 weeks gestation of pregnancy: Secondary | ICD-10-CM | POA: Diagnosis not present

## 2018-01-18 ENCOUNTER — Other Ambulatory Visit: Payer: Self-pay | Admitting: Primary Care

## 2018-01-18 DIAGNOSIS — E119 Type 2 diabetes mellitus without complications: Secondary | ICD-10-CM

## 2018-01-19 DIAGNOSIS — Z3682 Encounter for antenatal screening for nuchal translucency: Secondary | ICD-10-CM | POA: Diagnosis not present

## 2018-01-19 DIAGNOSIS — Z3689 Encounter for other specified antenatal screening: Secondary | ICD-10-CM | POA: Diagnosis not present

## 2018-01-19 DIAGNOSIS — Z1151 Encounter for screening for human papillomavirus (HPV): Secondary | ICD-10-CM | POA: Diagnosis not present

## 2018-01-19 DIAGNOSIS — Z113 Encounter for screening for infections with a predominantly sexual mode of transmission: Secondary | ICD-10-CM | POA: Diagnosis not present

## 2018-01-19 DIAGNOSIS — Z1379 Encounter for other screening for genetic and chromosomal anomalies: Secondary | ICD-10-CM | POA: Diagnosis not present

## 2018-01-19 DIAGNOSIS — Z363 Encounter for antenatal screening for malformations: Secondary | ICD-10-CM | POA: Diagnosis not present

## 2018-01-19 DIAGNOSIS — Z124 Encounter for screening for malignant neoplasm of cervix: Secondary | ICD-10-CM | POA: Diagnosis not present

## 2018-01-19 LAB — RESULTS CONSOLE HPV: CHL HPV: NEGATIVE

## 2018-01-19 LAB — HM PAP SMEAR: HM Pap smear: NEGATIVE

## 2018-01-31 ENCOUNTER — Other Ambulatory Visit: Payer: Self-pay

## 2018-01-31 ENCOUNTER — Encounter (HOSPITAL_COMMUNITY): Payer: Self-pay | Admitting: *Deleted

## 2018-01-31 DIAGNOSIS — O3680X0 Pregnancy with inconclusive fetal viability, not applicable or unspecified: Secondary | ICD-10-CM | POA: Diagnosis not present

## 2018-01-31 DIAGNOSIS — O021 Missed abortion: Secondary | ICD-10-CM | POA: Diagnosis not present

## 2018-01-31 DIAGNOSIS — O09299 Supervision of pregnancy with other poor reproductive or obstetric history, unspecified trimester: Secondary | ICD-10-CM | POA: Diagnosis not present

## 2018-01-31 DIAGNOSIS — O34211 Maternal care for low transverse scar from previous cesarean delivery: Secondary | ICD-10-CM | POA: Diagnosis not present

## 2018-01-31 NOTE — H&P (Signed)
Kelly Gregory is an 32 y.o. female [phone] with a missed miscarriage identified on [place] 01/31/18.  85w2dCRL with no FHM.  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  Hgb A1c 5.8 at intake, on metformin q AM 10040m  She has borderline CHTN, but controlled on no meds.    Pertinent Gynecological History:  OB History: SAB x 2                     LTCS x 2  Menstrual History: No LMP recorded (lmp unknown). Patient is pregnant.    Past Medical History:  Diagnosis Date  . Borderline diabetes   . DM (diabetes mellitus) ([facility]  . Elevated blood pressure    no meds  . Gestational diabetes   . Rheumatoid arthritis ([facility]   Years ago, does not follow, no meds  . Seasonal allergies   . Syncope    As a child, no problems during adulthood.    Past Surgical History:  Procedure Laterality Date  . CESAREAN SECTION  04/01/2011   Procedure: CESAREAN SECTION;  Surgeon: KaLogan Bores [facility];  Service: Gynecology;  Laterality: N/A;  . CESAREAN SECTION N/A 05/23/2013   Procedure: CESAREAN SECTION;  Surgeon: ToCheri FowlerMD;  [facility];  Service: Obstetrics;  Laterality: N/A;  . CYSTECTOMY Left    removed from underarm   . TOOTH EXTRACTION     wisdom teeth ext    Family History  Adopted: Yes  Problem Relation Age of Onset  . Other Unknown        Adopted    Social History:  reports that she has never smoked. She has never used smokeless tobacco. She reports that she does not drink alcohol or use drugs.  Allergies:  Allergies  Allergen Reactions  . Azithromycin Nausea And Vomiting  . Gatifloxacin Nausea And Vomiting  . Tequin Nausea And Vomiting  . Penicillins Hives, Rash and Other (See Comments)    Has patient had a PCN reaction causing immediate rash, facial/tongue/throat swelling, SOB or lightheadedness with hypotension: Unknown Has patient had a PCN reaction causing severe rash involving mucus membranes or skin necrosis: Unknown Has patient  had a PCN reaction that required hospitalization: Unknown Has patient had a PCN reaction occurring within the last 10 years: No If all of the above answers are "NO", then may proceed with Cephalosporin use.     No medications prior to admission.    Review of Systems  Cardiovascular: Negative for chest pain.  Gastrointestinal: Negative for abdominal pain.  Neurological: Negative for headaches.    Height 4' 9"  (1.448 m), weight 175 lb (79.4 kg). Physical Exam  Cardiovascular: Normal rate and regular rhythm.  Respiratory: Effort normal.  GI: Soft.  Genitourinary: Vagina normal.  Neurological: She is alert.  Psychiatric: She has a normal mood and affect.    No results found for this or any previous visit (from the past 24 hour(s)).  No results found.  Assessment/Plan: We discussed options in detail and that I recommend a D&C given how far along she is. We also discussed Panorama for determining if any obcious genetic cause for the missed miscarriage. She denies any cramping or VB. States BS have been well-controlled. We discussed D&E in detail including risks and benefits including bleeding, infection and possible uterine perforation. She desires to proceed so will set up and plan cytotec prior to surgery to help with cervical dilation  Her  blood type is A positive.  She would like for me to send the tissue for analysis with [facility], I will bring the kit to [facility].  Logan Bores 01/31/2018, 2:54 PM

## 2018-02-01 ENCOUNTER — Encounter (HOSPITAL_COMMUNITY)
Admission: RE | Disposition: A | Discharge status: Home / Self Care | Payer: Self-pay | Source: Ambulatory Visit | Attending: Obstetrics and Gynecology

## 2018-02-01 ENCOUNTER — Ambulatory Visit (HOSPITAL_COMMUNITY): Payer: BLUE CROSS/BLUE SHIELD | Admitting: Anesthesiology

## 2018-02-01 ENCOUNTER — Other Ambulatory Visit: Payer: Self-pay

## 2018-02-01 ENCOUNTER — Ambulatory Visit (HOSPITAL_COMMUNITY)
Admission: RE | Admit: 2018-02-01 | Discharge: 2018-02-01 | Disposition: A | Discharge status: Home / Self Care | Payer: BLUE CROSS/BLUE SHIELD | Source: Ambulatory Visit | Attending: Obstetrics and Gynecology | Admitting: Obstetrics and Gynecology

## 2018-02-01 ENCOUNTER — Encounter (HOSPITAL_COMMUNITY): Payer: Self-pay

## 2018-02-01 DIAGNOSIS — E119 Type 2 diabetes mellitus without complications: Secondary | ICD-10-CM

## 2018-02-01 DIAGNOSIS — O021 Missed abortion: Secondary | ICD-10-CM | POA: Diagnosis not present

## 2018-02-01 HISTORY — PX: DILATION AND EVACUATION: SHX1459

## 2018-02-01 HISTORY — DX: Type 2 diabetes mellitus without complications: E11.9

## 2018-02-01 HISTORY — DX: Other seasonal allergic rhinitis: J30.2

## 2018-02-01 LAB — CBC
HCT: 44.3 % (ref 36.0–46.0)
Hemoglobin: 15.5 g/dL — ABNORMAL HIGH (ref 12.0–15.0)
MCH: 30 pg (ref 26.0–34.0)
MCHC: 35 g/dL (ref 30.0–36.0)
MCV: 85.7 fL (ref 78.0–100.0)
Platelets: 294 10*3/uL (ref 150–400)
RBC: 5.17 MIL/uL — AB (ref 3.87–5.11)
RDW: 13.8 % (ref 11.5–15.5)
WBC: 7.6 10*3/uL (ref 4.0–10.5)

## 2018-02-01 LAB — BASIC METABOLIC PANEL
ANION GAP: 10 (ref 5–15)
BUN: 7 mg/dL (ref 6–20)
CALCIUM: 8.8 mg/dL — AB (ref 8.9–10.3)
CO2: 22 mmol/L (ref 22–32)
Chloride: 106 mmol/L (ref 101–111)
Creatinine, Ser: 0.59 mg/dL (ref 0.44–1.00)
GFR calc non Af Amer: >60 mL/min (ref >60)
Glucose, Bld: 106 mg/dL — ABNORMAL HIGH (ref 65–99)
POTASSIUM: 4 mmol/L (ref 3.5–5.1)
Sodium: 138 mmol/L (ref 135–145)

## 2018-02-01 LAB — GLUCOSE, CAPILLARY: GLUCOSE-CAPILLARY: 91 mg/dL (ref 65–99)

## 2018-02-01 SURGERY — DILATION AND EVACUATION, UTERUS
Anesthesia: General | Site: Vagina

## 2018-02-01 MED ORDER — OXYCODONE HCL 5 MG/5ML PO SOLN: 5.0000 mg | Freq: Once | ORAL | Status: DC | PRN

## 2018-02-01 MED ORDER — MEPERIDINE HCL 25 MG/ML IJ SOLN
6.2500 mg | INTRAMUSCULAR | Status: DC | PRN
  Administered 2018-02-01: 12.5 mg via INTRAVENOUS

## 2018-02-01 MED ORDER — MIDAZOLAM HCL 2 MG/2ML IJ SOLN
INTRAMUSCULAR | Status: AC
  Filled 2018-02-01: qty 2

## 2018-02-01 MED ORDER — MEPERIDINE HCL 25 MG/ML IJ SOLN
INTRAMUSCULAR | Status: AC
  Administered 2018-02-01: 12.5 mg via INTRAVENOUS
  Filled 2018-02-01: qty 1

## 2018-02-01 MED ORDER — KETOROLAC TROMETHAMINE 30 MG/ML IJ SOLN
INTRAMUSCULAR | Status: DC | PRN
  Administered 2018-02-01: 30 mg via INTRAVENOUS

## 2018-02-01 MED ORDER — SCOPOLAMINE 1 MG/3DAYS TD PT72
MEDICATED_PATCH | TRANSDERMAL | Status: AC
  Administered 2018-02-01: 1.5 mg via TRANSDERMAL
  Filled 2018-02-01: qty 1

## 2018-02-01 MED ORDER — PROMETHAZINE HCL 25 MG/ML IJ SOLN: 6.2500 mg | INTRAMUSCULAR | Status: DC | PRN

## 2018-02-01 MED ORDER — IBUPROFEN 200 MG PO TABS: 600.0000 mg | ORAL_TABLET | Freq: Four times a day (QID) | ORAL | 0 refills | Status: DC | PRN

## 2018-02-01 MED ORDER — LIDOCAINE HCL 1 % IJ SOLN
INTRAMUSCULAR | Status: AC
  Filled 2018-02-01: qty 20

## 2018-02-01 MED ORDER — MIDAZOLAM HCL 2 MG/2ML IJ SOLN
INTRAMUSCULAR | Status: DC | PRN
  Administered 2018-02-01: 2 mg via INTRAVENOUS

## 2018-02-01 MED ORDER — FENTANYL CITRATE (PF) 100 MCG/2ML IJ SOLN
INTRAMUSCULAR | Status: DC | PRN
  Administered 2018-02-01: 50 ug via INTRAVENOUS

## 2018-02-01 MED ORDER — FENTANYL CITRATE (PF) 100 MCG/2ML IJ SOLN
INTRAMUSCULAR | Status: AC
Start: 2018-02-01 — End: ?
  Filled 2018-02-01: qty 2

## 2018-02-01 MED ORDER — ONDANSETRON HCL 4 MG/2ML IJ SOLN
INTRAMUSCULAR | Status: AC
  Filled 2018-02-01: qty 2

## 2018-02-01 MED ORDER — PROPOFOL 10 MG/ML IV BOLUS
INTRAVENOUS | Status: AC
  Filled 2018-02-01: qty 40

## 2018-02-01 MED ORDER — LACTATED RINGERS IV SOLN: INTRAVENOUS | Status: DC

## 2018-02-01 MED ORDER — LIDOCAINE HCL (CARDIAC) PF 100 MG/5ML IV SOSY
PREFILLED_SYRINGE | INTRAVENOUS | Status: AC
  Filled 2018-02-01: qty 5

## 2018-02-01 MED ORDER — OXYCODONE HCL 5 MG PO TABS: 5.0000 mg | ORAL_TABLET | Freq: Once | ORAL | Status: DC | PRN

## 2018-02-01 MED ORDER — LIDOCAINE HCL 1 % IJ SOLN
INTRAMUSCULAR | Status: DC | PRN
  Administered 2018-02-01: 20 mL

## 2018-02-01 MED ORDER — SILVER NITRATE-POT NITRATE 75-25 % EX MISC
CUTANEOUS | Status: DC | PRN
  Administered 2018-02-01: 1 via TOPICAL

## 2018-02-01 MED ORDER — HYDROMORPHONE HCL 1 MG/ML IJ SOLN: 0.2500 mg | INTRAMUSCULAR | Status: DC | PRN

## 2018-02-01 MED ORDER — LIDOCAINE HCL (CARDIAC) PF 100 MG/5ML IV SOSY
PREFILLED_SYRINGE | INTRAVENOUS | Status: DC | PRN
  Administered 2018-02-01: 80 mg via INTRAVENOUS

## 2018-02-01 MED ORDER — PROPOFOL 10 MG/ML IV BOLUS
INTRAVENOUS | Status: DC | PRN
  Administered 2018-02-01: 150 mg via INTRAVENOUS

## 2018-02-01 MED ORDER — LACTATED RINGERS IV SOLN
INTRAVENOUS | Status: DC
  Administered 2018-02-01: 125 mL/h via INTRAVENOUS

## 2018-02-01 MED ORDER — SCOPOLAMINE 1 MG/3DAYS TD PT72
1.0000 | MEDICATED_PATCH | Freq: Once | TRANSDERMAL | Status: DC
  Administered 2018-02-01: 1.5 mg via TRANSDERMAL

## 2018-02-01 MED ORDER — KETOROLAC TROMETHAMINE 30 MG/ML IJ SOLN
INTRAMUSCULAR | Status: AC
  Filled 2018-02-01: qty 1

## 2018-02-01 SURGICAL SUPPLY — 22 items
CATH ROBINSON RED A/P 16FR (CATHETERS) ×3 IMPLANT
DECANTER SPIKE VIAL GLASS SM (MISCELLANEOUS) ×3 IMPLANT
GLOVE BIO SURGEON STRL SZ 6.5 (GLOVE) ×2 IMPLANT
GLOVE BIO SURGEONS STRL SZ 6.5 (GLOVE) ×1
GLOVE BIOGEL PI IND STRL 7.0 (GLOVE) ×1 IMPLANT
GLOVE BIOGEL PI INDICATOR 7.0 (GLOVE) ×2
GOWN STRL REUS W/TWL LRG LVL3 (GOWN DISPOSABLE) ×6 IMPLANT
KIT BERKELEY 1ST TRIMESTER 3/8 (MISCELLANEOUS) ×3 IMPLANT
KIT BERKELEY 2ND TRIMESTER 1/2 (COLLECTOR) ×3 IMPLANT
NS IRRIG 1000ML POUR BTL (IV SOLUTION) ×3 IMPLANT
PACK VAGINAL MINOR WOMEN LF (CUSTOM PROCEDURE TRAY) ×3 IMPLANT
PAD OB MATERNITY 4.3X12.25 (PERSONAL CARE ITEMS) ×3 IMPLANT
PAD PREP 24X48 CUFFED NSTRL (MISCELLANEOUS) ×3 IMPLANT
SET BERKELEY SUCTION TUBING (SUCTIONS) ×3 IMPLANT
TOWEL OR 17X24 6PK STRL BLUE (TOWEL DISPOSABLE) ×6 IMPLANT
TUBE VACURETTE 2ND TRIMESTER (CANNULA) ×3 IMPLANT
VACURETTE 10 RIGID CVD (CANNULA) IMPLANT
VACURETTE 12 RIGID CVD (CANNULA) ×3 IMPLANT
VACURETTE 14MM CVD 1/2 BASE (CANNULA) ×3 IMPLANT
VACURETTE 7MM CVD STRL WRAP (CANNULA) IMPLANT
VACURETTE 8 RIGID CVD (CANNULA) IMPLANT
VACURETTE 9 RIGID CVD (CANNULA) IMPLANT

## 2018-02-01 NOTE — Progress Notes (Signed)
Patient ID: Kelly Gregory, female   DOB: 12-15-1985, 32 y.o.   MRN: [id number] Per pt no changes in dictated H&P.  Brief exam WNL. Ready to proceed.

## 2018-02-01 NOTE — Anesthesia Preprocedure Evaluation (Signed)
Anesthesia Evaluation  Patient identified by MRN, date of birth, ID band Patient awake    Reviewed: Allergy & Precautions, H&P , Patient's Chart, lab work & pertinent test results, reviewed documented beta blocker date and time   Airway Mallampati: II  TM Distance: >3 FB Neck ROM: full    Dental  (+) Teeth Intact   Pulmonary neg pulmonary ROS,    breath sounds clear to auscultation       Cardiovascular negative cardio ROS   Rhythm:regular Rate:Normal     Neuro/Psych negative neurological ROS  negative psych ROS   GI/Hepatic negative GI ROS, Neg liver ROS,   Endo/Other  negative endocrine ROSdiabetes, Gestational  Renal/GU negative Renal ROS     Musculoskeletal   Abdominal   Peds  Hematology negative hematology ROS (+)   Anesthesia Other Findings   Reproductive/Obstetrics                             Anesthesia Physical  Anesthesia Plan  ASA: II  Anesthesia Plan: General   Post-op Pain Management:    Induction: Intravenous  PONV Risk Score and Plan: 3 and Ondansetron, Dexamethasone and Midazolam  Airway Management Planned: LMA  Additional Equipment:   Intra-op Plan:   Post-operative Plan: Extubation in OR  Informed Consent: I have reviewed the patients History and Physical, chart, labs and discussed the procedure including the risks, benefits and alternatives for the proposed anesthesia with the patient or authorized representative who has indicated his/her understanding and acceptance.     Plan Discussed with:   Anesthesia Plan Comments:         Anesthesia Quick Evaluation

## 2018-02-01 NOTE — Discharge Instructions (Signed)
DISCHARGE INSTRUCTIONS: D&C / D&E °The following instructions have been prepared to help you care for yourself upon your return home. °  °Personal hygiene: °• Use sanitary pads for vaginal drainage, not tampons. °• Shower the day after your procedure. °• NO tub baths, pools or Jacuzzis for 2-3 weeks. °• Wipe front to back after using the bathroom. ° °Activity and limitations: °• For the next 24 hours, DO NOT: °-Drive a car °-Operate machinery °-Drink alcoholic beverages °-Take any medication unless instructed by your physician °-Make any legal decisions or sign important papers. °• Do NOT rest in bed all day. °• Walking is encouraged. °• Walk up and down stairs slowly. °• You may resume your normal activity in one to two days or as indicated by your physician. ° °Sexual activity: NO intercourse for at least 2 weeks after the procedure, or as indicated by your physician. ° °Diet: Eat a light meal as desired this evening. You may resume your usual diet tomorrow. ° °Return to work: You may resume your work activities in one to two days or as indicated by your doctor. ° °What to expect after your surgery: Expect to have vaginal bleeding/discharge for 2-3 days and spotting for up to 10 days. It is not unusual to have soreness for up to 1-2 weeks. You may have a slight burning sensation when you urinate for the first day. Mild cramps may continue for a couple of days. You may have a regular period in 2-6 weeks. ° °Call your doctor for any of the following: °• Excessive vaginal bleeding, saturating and changing one pad every hour. °• Inability to urinate 6 hours after discharge from hospital. °• Pain not relieved by pain medication. °• Fever of 100.4° F or greater. °• Unusual vaginal discharge or odor. ° °Special Instructions/Symptoms: °Your throat may feel dry or sore from the anesthesia or the breathing tube placed in your throat during surgery. If this causes discomfort, gargle with warm salt water. The discomfort  should disappear within 24 hours. ° °If you had a scopolamine patch placed behind your ear for the management of post- operative nausea and/or vomiting: ° °1. The medication in the patch is effective for 72 hours, after which it should be removed.  Wrap patch in a tissue and discard in the trash. Wash hands thoroughly with soap and water. °2. You may remove the patch earlier than 72 hours if you experience unpleasant side effects which may include dry mouth, dizziness or visual disturbances. °3. Avoid touching the patch. Wash your hands with soap and water after contact with the patch. °

## 2018-02-01 NOTE — Anesthesia Procedure Notes (Signed)
Procedure Name: LMA Insertion Date/Time: 02/01/2018 9:53 AM Performed by: Armanda Heritage, CRNA Pre-anesthesia Checklist: Patient identified, Emergency Drugs available, Suction available, Patient being monitored and Timeout performed Patient Re-evaluated:Patient Re-evaluated prior to induction Oxygen Delivery Method: Circle system utilized Preoxygenation: Pre-oxygenation with 100% oxygen Induction Type: IV induction LMA: LMA inserted LMA Size: 4.0 Number of attempts: 1 ETT to lip (cm): at lip.

## 2018-02-01 NOTE — Anesthesia Postprocedure Evaluation (Signed)
Anesthesia Post Note  Patient: Kelly Gregory  Procedure(s) Performed: DILATATION AND EVACUATION (N/A Vagina )     Patient location during evaluation: PACU Anesthesia Type: General Level of consciousness: awake and alert Pain management: pain level controlled Vital Signs Assessment: post-procedure vital signs reviewed and stable Respiratory status: spontaneous breathing, nonlabored ventilation and respiratory function stable Cardiovascular status: blood pressure returned to baseline and stable Postop Assessment: no apparent nausea or vomiting Anesthetic complications: no    Last Vitals:  Vitals:   02/01/18 1130 02/01/18 1145  BP: (!) 138/97 (!) 145/101  Pulse: 76 72  Resp: 14 17  Temp:    SpO2: 95% 98%    Last Pain:  Vitals:   02/01/18 1145  TempSrc:   PainSc: 0-No pain   Pain Goal: Patients Stated Pain Goal: 2 (02/01/18 0855)               Lowella Curb

## 2018-02-01 NOTE — Op Note (Signed)
Operative Note    Preoperative Diagnosis Missed miscarriage at 11+weeks  Postoperative Diagnosis same  Procedure Suction Dilation and Evacuation  Surgeon Huel Cote, MD  Anesthesia LMA  Fluids: EBL 65mL UOP 18ml IVF   Findings A moderate/large amount of POC's, uterine fundus 12 weeks  Specimen POC's taken to office to send to [facility] for anora genetic testing per pt request  Procedure Note Pt was taken to operating room where LMA anesthesia was obtained without difficulty.  She was prepped and draped in the normal sterile fashion in the dorsal lithotomy position.  An appropriate timeout was performed.  A speculum was placed in the vagina and 2cc of 1% plain lidocaine injected into the anterior cervix.  An additional 9cc was placed both at 2 and 10 o'clock for a paracervical block.  A single tooth tenaculum was placed on the anterior lip of the cervix. The cervix was slightly dilated from the pt's cytotec treatment and  sound was easily introduced into the uterus and the uterus sounded to about 13cm.  The 12 mm suction currette was obtained and introduced into the fundus.  With several passes, a moderate/large amount of tissue was obtained.  When no further tissue was noted, the suction was discontinued and a gentle currettage performed.  One additional pass revealed no further tissue, and the procedure was completed.  The tenaculum was removed and silver nitrate used for hemostasis.  All instruments and sponges were removed from vagina and the patient awakened and taken to the PACU in good condition.

## 2018-02-01 NOTE — Transfer of Care (Signed)
Immediate Anesthesia Transfer of Care Note  Patient: Kelly Gregory  Procedure(s) Performed: DILATATION AND EVACUATION (N/A Vagina )  Patient Location: PACU  Anesthesia Type:General  Level of Consciousness: awake, alert  and oriented  Airway & Oxygen Therapy: Patient Spontanous Breathing and Patient connected to nasal cannula oxygen  Post-op Assessment: Report given to RN and Post -op Vital signs reviewed and stable  Post vital signs: Reviewed and stable  Last Vitals:  Vitals Value Taken Time  BP    Temp    Pulse 150 02/01/2018 10:29 AM  Resp 34 02/01/2018 10:29 AM  SpO2 100 % 02/01/2018 10:29 AM  Vitals shown include unvalidated device data.  Last Pain:  Vitals:   02/01/18 0855  TempSrc: Oral  PainSc: 0-No pain      Patients Stated Pain Goal: 2 (02/01/18 0855)  Complications: No apparent anesthesia complications

## 2018-02-01 NOTE — OR Nursing (Signed)
Products of Conception specimen not sent to pathology.  Specimen collected for Anora miscarriage kit at 1010 am.  Kit given to Dr. Marvel Plan for transport to her office for testing

## 2018-02-02 ENCOUNTER — Encounter (HOSPITAL_COMMUNITY): Payer: Self-pay | Admitting: Obstetrics and Gynecology

## 2018-05-17 ENCOUNTER — Encounter: Payer: Self-pay | Admitting: Primary Care

## 2018-05-17 ENCOUNTER — Ambulatory Visit: Payer: BLUE CROSS/BLUE SHIELD | Admitting: Primary Care

## 2018-05-17 VITALS — BP 148/104 | HR 120 | Temp 98.6°F | Ht <= 58 in | Wt 157.5 lb

## 2018-05-17 DIAGNOSIS — E119 Type 2 diabetes mellitus without complications: Secondary | ICD-10-CM

## 2018-05-17 DIAGNOSIS — E785 Hyperlipidemia, unspecified: Secondary | ICD-10-CM | POA: Diagnosis not present

## 2018-05-17 DIAGNOSIS — I10 Essential (primary) hypertension: Secondary | ICD-10-CM | POA: Diagnosis not present

## 2018-05-17 LAB — POCT GLYCOSYLATED HEMOGLOBIN (HGB A1C): Hemoglobin A1C: 5.6 % (ref 4.0–5.6)

## 2018-05-17 LAB — COMPREHENSIVE METABOLIC PANEL
ALT: 31 U/L (ref 0–35)
AST: 23 U/L (ref 0–37)
Albumin: 3.6 g/dL (ref 3.5–5.2)
Alkaline Phosphatase: 61 U/L (ref 39–117)
BUN: 8 mg/dL (ref 6–23)
CO2: 24 mEq/L (ref 19–32)
Calcium: 8.9 mg/dL (ref 8.4–10.5)
Chloride: 101 mEq/L (ref 96–112)
Creatinine, Ser: 0.7 mg/dL (ref 0.40–1.20)
GFR: 101.61 mL/min (ref >60.00)
Glucose, Bld: 105 mg/dL — ABNORMAL HIGH (ref 70–99)
POTASSIUM: 3.7 meq/L (ref 3.5–5.1)
SODIUM: 134 meq/L — AB (ref 135–145)
Total Bilirubin: 0.4 mg/dL (ref 0.2–1.2)
Total Protein: 7.1 g/dL (ref 6.0–8.3)

## 2018-05-17 LAB — MICROALBUMIN / CREATININE URINE RATIO
Creatinine,U: 11.1 mg/dL
MICROALB UR: 1.5 mg/dL (ref 0.0–1.9)
Microalb Creat Ratio: 13.3 mg/g (ref 0.0–30.0)

## 2018-05-17 LAB — LIPID PANEL
Cholesterol: 238 mg/dL — ABNORMAL HIGH (ref 0–200)
HDL: 35.8 mg/dL — ABNORMAL LOW (ref >39.00)
NonHDL: 201.83
Total CHOL/HDL Ratio: 7
Triglycerides: 211 mg/dL — ABNORMAL HIGH (ref 0.0–149.0)
VLDL: 42.2 mg/dL — AB (ref 0.0–40.0)

## 2018-05-17 LAB — LDL CHOLESTEROL, DIRECT: Direct LDL: 178 mg/dL

## 2018-05-17 MED ORDER — METFORMIN HCL ER 500 MG PO TB24: 1000.0000 mg | ORAL_TABLET | Freq: Every day | ORAL | 3 refills | Status: DC

## 2018-05-17 MED ORDER — LISINOPRIL 10 MG PO TABS: ORAL_TABLET | ORAL | 0 refills | Status: DC

## 2018-05-17 NOTE — Progress Notes (Signed)
Subjective:    Patient ID: Kelly Gregory, female    DOB: 1985/10/18, 32 y.o.   MRN: [id number]  HPI  Kelly Gregory is a 32 year old female who presents today for follow up.  1) Type 2 Diabetes:    Current medications include: Metformin XR 1000 mg daily.   She is not checking her blood sugars.   Last A1C: 5.6 today, prior A1C of 7.1 one year ago. Last Eye Exam: Completed Last Foot Exam: Due today Pneumonia Vaccination: Completed in 2017 ACE/ARB: Urine microalbumin due Statin: Crestor, stopped taking during pregnancy three months  Diet currently consists of:  Breakfast: Cliff bars Lunch: Left overs from dinner; deli meat Dinner: Chicken, tacos, green beans, broccoli Snacks: None Desserts: Infrequently  Beverages: Water, protein shakes, occasional soda  Exercise: She is walking/running three times weekly, also doing beach body work outs.   Wt Readings from Last 3 Encounters:  05/17/18 157 lb 8 oz (71.4 kg)  01/31/18 175 lb (79.4 kg)  11/26/16 175 lb (79.4 kg)     2) Hypertension: No prior diagnosis. Doesn't feel overly stressed as she is now no longer working. History of miscarriage three months ago, did have elevated readings during pregnancy. Her OB/GYN was going to initiate her on antihypertensives at one point.  She's checking her BP once weekly at home and is getting readings of 140/110's. She denies headaches, fatigue, chest pain, visual changes.   BP Readings from Last 3 Encounters:  05/17/18 (!) 148/104  02/01/18 (!) 146/90  11/26/16 118/80    3) Hyperlipidemia: Currently managed on rosuvastatin 10 mg for which she stopped taking 3 months ago. She has been working on weight loss through diet and exercise.   Review of Systems  Constitutional: Negative for fatigue.  Respiratory: Negative for shortness of breath.   Cardiovascular: Negative for chest pain.  Neurological: Negative for numbness and headaches.  Psychiatric/Behavioral: The patient is not  nervous/anxious.        Past Medical History:  Diagnosis Date  . Borderline diabetes   . DM (diabetes mellitus) (HCC)   . Elevated blood pressure    no meds  . Gestational diabetes   . Rheumatoid arthritis (HCC)    Years ago, does not follow, no meds  . Seasonal allergies   . Syncope    As a child, no problems during adulthood.     Social History   Socioeconomic History  . Marital status: Married    Spouse name: Not on file  . Number of children: Not on file  . Years of education: Not on file  . Highest education level: Not on file  Occupational History  . Not on file  Social Needs  . Financial resource strain: Not on file  . Food insecurity:    Worry: Not on file    Inability: Not on file  . Transportation needs:    Medical: Not on file    Non-medical: Not on file  Tobacco Use  . Smoking status: Never Smoker  . Smokeless tobacco: Never Used  Substance and Sexual Activity  . Alcohol use: No  . Drug use: No  . Sexual activity: Yes    Comment: approx [redacted] wks gestation  Lifestyle  . Physical activity:    Days per week: Not on file    Minutes per session: Not on file  . Stress: Not on file  Relationships  . Social connections:    Talks on phone: Not on file    Gets  together: Not on file    Attends religious service: Not on file    Active member of club or organization: Not on file    Attends meetings of clubs or organizations: Not on file    Relationship status: Not on file  . Intimate partner violence:    Fear of current or ex partner: Not on file    Emotionally abused: Not on file    Physically abused: Not on file    Forced sexual activity: Not on file  Other Topics Concern  . Not on file  Social History Narrative   Married.   2 children.   Works as a Paramedic.   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    Past Surgical History:  Procedure Laterality Date  . CESAREAN SECTION  04/01/2011   Procedure: CESAREAN SECTION;  Surgeon: Oliver Pila;  Location: [facility];  Service: Gynecology;  Laterality: N/A;  . CESAREAN SECTION N/A 05/23/2013   Procedure: CESAREAN SECTION;  Surgeon: Lavina Hamman, MD;  Location: [facility];  Service: Obstetrics;  Laterality: N/A;  . CYSTECTOMY Left    removed from underarm   . DILATION AND EVACUATION N/A 02/01/2018   Procedure: DILATATION AND EVACUATION;  Surgeon: Huel Cote, MD;  Location: [facility];  Service: Gynecology;  Laterality: N/A;  . TOOTH EXTRACTION     wisdom teeth ext    Family History  Adopted: Yes  Problem Relation Age of Onset  . Other Unknown        Adopted    Allergies  Allergen Reactions  . Azithromycin Nausea And Vomiting  . Gatifloxacin Nausea And Vomiting and Other (See Comments)  . Tequin Nausea And Vomiting  . Penicillins Hives, Rash and Other (See Comments)    Has patient had a PCN reaction causing immediate rash, facial/tongue/throat swelling, SOB or lightheadedness with hypotension: Unknown Has patient had a PCN reaction causing severe rash involving mucus membranes or skin necrosis: Unknown Has patient had a PCN reaction that required hospitalization: Unknown Has patient had a PCN reaction occurring within the last 10 years: No If all of the above answers are "NO", then may proceed with Cephalosporin use.     Current Outpatient Medications on File Prior to Visit  Medication Sig Dispense Refill  . acetaminophen (TYLENOL) 325 MG tablet Take 325 mg by mouth every 6 (six) hours as needed for moderate pain or headache.    . diphenhydrAMINE (BENADRYL) 12.5 MG/5ML liquid Take 6.25 mg by mouth 4 (four) times daily as needed for allergies.    . folic acid (FOLVITE) 400 MCG tablet Take 400 mcg by mouth daily.    Marland Kitchen ibuprofen (MOTRIN IB) 200 MG tablet Take 3 tablets (600 mg total) by mouth every 6 (six) hours as needed. 30 tablet 0  . rosuvastatin (CRESTOR) 10 MG tablet Take 1 tablet (10 mg total) by mouth daily. (Patient not taking: Reported on 01/31/2018) 90 tablet  3   No current facility-administered medications on file prior to visit.     BP (!) 148/104   Pulse (!) 120   Temp 98.6 F (37 C) (Oral)   Ht 4\' 9"  (1.448 m)   Wt 157 lb 8 oz (71.4 kg)   LMP 04/27/2018 Comment: approx [redacted] wks gestation  SpO2 98%   Breastfeeding? Unknown   BMI 34.08 kg/m    Objective:   Physical Exam  Constitutional: She appears well-nourished.  Neck: Neck supple.  Cardiovascular: Normal rate and regular rhythm.  Respiratory: Effort normal and breath  sounds normal.  Skin: Skin is warm and dry.  Psychiatric: She has a normal mood and affect.           Assessment & Plan:

## 2018-05-17 NOTE — Assessment & Plan Note (Signed)
Above goal in the office today, also with documented reading in May 2019, also with elevated home readings.  Given history of diabetes, will treat. Rx for lisinopril 10 mg sent to pharmacy. Will have her monitor BP at home. Follow up in 2-3 weeks for BP check and BMP.

## 2018-05-17 NOTE — Assessment & Plan Note (Signed)
Recent A1C of 5.7, likely secondary to weight loss from diet and exercise. Commended her on this accomplishment.   Continue Metformin. Initiated ACE due to uncontrolled hypertension.  Will have her continue to remain off of statin as she's done a great job with weight loss through diet and exercise. Pneumonia vaccination UTD. Eye exam UTD.  Follow up in 2-3 weeks for BP check, 6 months for diabetes check.

## 2018-05-17 NOTE — Patient Instructions (Addendum)
Start lisinopril 10 mg tablets for blood pressure. Take 1 tablet by mouth once daily.  Start monitoring your blood pressure daily, around the same time of day, for the next 2-3 weeks.  Ensure that you have rested for 30 minutes prior to checking your blood pressure. Record your readings and bring them to your next visit.  Continue Metformin once daily as discussed.   Continue to work on [employer] and regular exercise, congratulations on your weight loss!  Stop by the lab prior to leaving today. I will notify you of your results once received.   Schedule a follow up visit in 2-3 weeks for blood pressure check.  It was a pleasure to see you today!

## 2018-06-03 ENCOUNTER — Encounter: Payer: Self-pay | Admitting: Primary Care

## 2018-06-03 ENCOUNTER — Ambulatory Visit (INDEPENDENT_AMBULATORY_CARE_PROVIDER_SITE_OTHER): Payer: BLUE CROSS/BLUE SHIELD | Admitting: Primary Care

## 2018-06-03 VITALS — BP 110/84 | HR 71 | Temp 97.7°F | Ht <= 58 in | Wt 154.0 lb

## 2018-06-03 DIAGNOSIS — Z23 Encounter for immunization: Secondary | ICD-10-CM | POA: Diagnosis not present

## 2018-06-03 DIAGNOSIS — I10 Essential (primary) hypertension: Secondary | ICD-10-CM

## 2018-06-03 DIAGNOSIS — E785 Hyperlipidemia, unspecified: Secondary | ICD-10-CM

## 2018-06-03 DIAGNOSIS — E119 Type 2 diabetes mellitus without complications: Secondary | ICD-10-CM

## 2018-06-03 LAB — BASIC METABOLIC PANEL
BUN: 10 mg/dL (ref 6–23)
CHLORIDE: 105 meq/L (ref 96–112)
CO2: 25 mEq/L (ref 19–32)
CREATININE: 0.62 mg/dL (ref 0.40–1.20)
Calcium: 8.8 mg/dL (ref 8.4–10.5)
GFR: 116.86 mL/min (ref >60.00)
GLUCOSE: 97 mg/dL (ref 70–99)
Potassium: 4.1 mEq/L (ref 3.5–5.1)
Sodium: 137 mEq/L (ref 135–145)

## 2018-06-03 NOTE — Progress Notes (Signed)
Subjective:    Patient ID: Kelly Gregory, female    DOB: April 13, 1986, 32 y.o.   MRN: [id number]  HPI  Kelly Gregory is a 32 year old female who presents today for follow up.  1) Essential Hypertension: Last evaluated in early September 2019 with elevated blood pressure reading. Also with several documented BP readings. She was initiated on lisinopril 10 mg and asked to follow up.   She denies cough, dizziness, shortness of breath. She's been checking her BP at home which is running low 100's/70-80's. She is feeling much better.   BP Readings from Last 3 Encounters:  06/03/18 110/84  05/17/18 (!) 148/104  02/01/18 (!) 146/90     2) Hyperlipidemia: Last lipid panel from early September 2019 with TC of 238, Trigs of 211, LDL of 178. She is working on weight loss through a healthy diet and exercise.  Wt Readings from Last 3 Encounters:  06/03/18 154 lb (69.9 kg)  05/17/18 157 lb 8 oz (71.4 kg)  01/31/18 175 lb (79.4 kg)   She stopped taking Crestor several months ago given efforts of healthy lifestyle changes. She is motivated to continue.   Review of Systems  Respiratory: Negative for cough and shortness of breath.   Cardiovascular: Negative for chest pain.  Neurological: Negative for dizziness and headaches.       Past Medical History:  Diagnosis Date  . Borderline diabetes   . DM (diabetes mellitus) (HCC)   . Elevated blood pressure    no meds  . Gestational diabetes   . Rheumatoid arthritis (HCC)    Years ago, does not follow, no meds  . Seasonal allergies   . Syncope    As a child, no problems during adulthood.     Social History   Socioeconomic History  . Marital status: Married    Spouse name: Not on file  . Number of children: Not on file  . Years of education: Not on file  . Highest education level: Not on file  Occupational History  . Not on file  Social Needs  . Financial resource strain: Not on file  . Food insecurity:    Worry: Not on file   Inability: Not on file  . Transportation needs:    Medical: Not on file    Non-medical: Not on file  Tobacco Use  . Smoking status: Never Smoker  . Smokeless tobacco: Never Used  Substance and Sexual Activity  . Alcohol use: No  . Drug use: No  . Sexual activity: Yes    Comment: approx [redacted] wks gestation  Lifestyle  . Physical activity:    Days per week: Not on file    Minutes per session: Not on file  . Stress: Not on file  Relationships  . Social connections:    Talks on phone: Not on file    Gets together: Not on file    Attends religious service: Not on file    Active member of club or organization: Not on file    Attends meetings of clubs or organizations: Not on file    Relationship status: Not on file  . Intimate partner violence:    Fear of current or ex partner: Not on file    Emotionally abused: Not on file    Physically abused: Not on file    Forced sexual activity: Not on file  Other Topics Concern  . Not on file  Social History Narrative   Married.   2 children.  Works as a Paramedic.   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    Past Surgical History:  Procedure Laterality Date  . CESAREAN SECTION  04/01/2011   Procedure: CESAREAN SECTION;  Surgeon: Kelly Gregory;  Location: [facility];  Service: Gynecology;  Laterality: N/A;  . CESAREAN SECTION N/A 05/23/2013   Procedure: CESAREAN SECTION;  Surgeon: Kelly Hamman, MD;  Location: [facility];  Service: Obstetrics;  Laterality: N/A;  . CYSTECTOMY Left    removed from underarm   . DILATION AND EVACUATION N/A 02/01/2018   Procedure: DILATATION AND EVACUATION;  Surgeon: Kelly Cote, MD;  Location: [facility];  Service: Gynecology;  Laterality: N/A;  . TOOTH EXTRACTION     wisdom teeth ext    Family History  Adopted: Yes  Problem Relation Age of Onset  . Other Unknown        Adopted    Allergies  Allergen Reactions  . Azithromycin Nausea And Vomiting  . Gatifloxacin Nausea And Vomiting and  Other (See Comments)  . Tequin Nausea And Vomiting  . Penicillins Hives, Rash and Other (See Comments)    Has patient had a PCN reaction causing immediate rash, facial/tongue/throat swelling, SOB or lightheadedness with hypotension: Unknown Has patient had a PCN reaction causing severe rash involving mucus membranes or skin necrosis: Unknown Has patient had a PCN reaction that required hospitalization: Unknown Has patient had a PCN reaction occurring within the last 10 years: No If all of the above answers are "NO", then may proceed with Cephalosporin use.     Current Outpatient Medications on File Prior to Visit  Medication Sig Dispense Refill  . acetaminophen (TYLENOL) 325 MG tablet Take 325 mg by mouth every 6 (six) hours as needed for moderate pain or headache.    . diphenhydrAMINE (BENADRYL) 12.5 MG/5ML liquid Take 6.25 mg by mouth 4 (four) times daily as needed for allergies.    . folic acid (FOLVITE) 400 MCG tablet Take 400 mcg by mouth daily.    Marland Kitchen ibuprofen (MOTRIN IB) 200 MG tablet Take 3 tablets (600 mg total) by mouth every 6 (six) hours as needed. 30 tablet 0  . lisinopril (PRINIVIL,ZESTRIL) 10 MG tablet Take 1 tablet by mouth once daily for blood pressure. 30 tablet 0  . metFORMIN (GLUCOPHAGE-XR) 500 MG 24 hr tablet Take 2 tablets (1,000 mg total) by mouth daily with breakfast. 180 tablet 3   No current facility-administered medications on file prior to visit.     BP 110/84   Pulse 71   Temp 97.7 F (36.5 C) (Oral)   Ht 4\' 9"  (1.448 m)   Wt 154 lb (69.9 kg)   LMP 05/30/2018   SpO2 98%   BMI 33.33 kg/m    Objective:   Physical Exam  Constitutional: She appears well-nourished.  Neck: Neck supple.  Cardiovascular: Normal rate and regular rhythm.  Respiratory: Effort normal and breath sounds normal.  Skin: Skin is warm and dry.           Assessment & Plan:

## 2018-06-03 NOTE — Addendum Note (Signed)
Addended by: Tawnya Crook on: 06/03/2018 09:22 AM   Modules accepted: Orders

## 2018-06-03 NOTE — Assessment & Plan Note (Signed)
Improved on lisinopril 10 mg, continue same.  BMP pending.

## 2018-06-03 NOTE — Patient Instructions (Addendum)
Continue lisinopril 10 mg tablets for blood pressure.  Continue Metformin for diabetes.   Continue exercising. You should be getting 150 minutes of moderate intensity exercise weekly.  Continue to work on [employer].  Please schedule a follow up appointment in 6 months for diabetes, cholesterol, and blood pressure. Make sure to come fasting 4 hours prior.   It was a pleasure to see you today!

## 2018-06-03 NOTE — Assessment & Plan Note (Signed)
Recent A1C of 5.6, commended her on this success as this was due to healthy lifestyle choices. Continue Metformin for now, consider reducing dose at next visit.  Follow up in 6 months.

## 2018-06-03 NOTE — Assessment & Plan Note (Signed)
Stopped taking Crestor several months ago. Repeat lipids above goal but given her efforts on lifestyle changes, will have her continue to work on diet and exercise.  Repeat lipids in 6 months.

## 2018-06-10 ENCOUNTER — Other Ambulatory Visit: Payer: Self-pay | Admitting: Primary Care

## 2018-06-10 DIAGNOSIS — I10 Essential (primary) hypertension: Secondary | ICD-10-CM

## 2018-08-22 DIAGNOSIS — M940 Chondrocostal junction syndrome [Tietze]: Secondary | ICD-10-CM | POA: Diagnosis not present

## 2018-08-22 DIAGNOSIS — J069 Acute upper respiratory infection, unspecified: Secondary | ICD-10-CM | POA: Diagnosis not present

## 2018-08-23 DIAGNOSIS — Z3201 Encounter for pregnancy test, result positive: Secondary | ICD-10-CM | POA: Diagnosis not present

## 2018-08-23 DIAGNOSIS — N911 Secondary amenorrhea: Secondary | ICD-10-CM | POA: Diagnosis not present

## 2018-09-01 DIAGNOSIS — Z3A09 9 weeks gestation of pregnancy: Secondary | ICD-10-CM | POA: Diagnosis not present

## 2018-09-01 DIAGNOSIS — O26891 Other specified pregnancy related conditions, first trimester: Secondary | ICD-10-CM | POA: Diagnosis not present

## 2018-09-13 DIAGNOSIS — Z3689 Encounter for other specified antenatal screening: Secondary | ICD-10-CM | POA: Diagnosis not present

## 2018-09-13 DIAGNOSIS — Z1379 Encounter for other screening for genetic and chromosomal anomalies: Secondary | ICD-10-CM | POA: Diagnosis not present

## 2018-09-13 DIAGNOSIS — Z113 Encounter for screening for infections with a predominantly sexual mode of transmission: Secondary | ICD-10-CM | POA: Diagnosis not present

## 2018-09-13 DIAGNOSIS — Z3A11 11 weeks gestation of pregnancy: Secondary | ICD-10-CM | POA: Diagnosis not present

## 2018-09-13 DIAGNOSIS — Z3682 Encounter for antenatal screening for nuchal translucency: Secondary | ICD-10-CM | POA: Diagnosis not present

## 2018-09-20 DIAGNOSIS — Z3A12 12 weeks gestation of pregnancy: Secondary | ICD-10-CM | POA: Diagnosis not present

## 2018-09-20 DIAGNOSIS — Z3682 Encounter for antenatal screening for nuchal translucency: Secondary | ICD-10-CM | POA: Diagnosis not present

## 2018-09-20 DIAGNOSIS — Z1379 Encounter for other screening for genetic and chromosomal anomalies: Secondary | ICD-10-CM | POA: Diagnosis not present

## 2018-09-20 DIAGNOSIS — O09511 Supervision of elderly primigravida, first trimester: Secondary | ICD-10-CM | POA: Diagnosis not present

## 2018-09-23 ENCOUNTER — Other Ambulatory Visit: Payer: Self-pay | Admitting: Obstetrics and Gynecology

## 2018-09-23 DIAGNOSIS — N6320 Unspecified lump in the left breast, unspecified quadrant: Secondary | ICD-10-CM

## 2018-10-04 ENCOUNTER — Ambulatory Visit
Admission: RE | Admit: 2018-10-04 | Discharge: 2018-10-04 | Disposition: A | Discharge status: Home / Self Care | Payer: BLUE CROSS/BLUE SHIELD | Source: Ambulatory Visit | Attending: Obstetrics and Gynecology | Admitting: Obstetrics and Gynecology

## 2018-10-04 DIAGNOSIS — N6320 Unspecified lump in the left breast, unspecified quadrant: Secondary | ICD-10-CM

## 2018-10-04 DIAGNOSIS — N6489 Other specified disorders of breast: Secondary | ICD-10-CM | POA: Diagnosis not present

## 2018-10-11 ENCOUNTER — Other Ambulatory Visit: Payer: Self-pay | Admitting: Primary Care

## 2018-10-11 DIAGNOSIS — I10 Essential (primary) hypertension: Secondary | ICD-10-CM

## 2018-10-21 DIAGNOSIS — Z36 Encounter for antenatal screening for chromosomal anomalies: Secondary | ICD-10-CM | POA: Diagnosis not present

## 2018-10-21 DIAGNOSIS — Z3689 Encounter for other specified antenatal screening: Secondary | ICD-10-CM | POA: Diagnosis not present

## 2018-10-31 DIAGNOSIS — O09512 Supervision of elderly primigravida, second trimester: Secondary | ICD-10-CM | POA: Diagnosis not present

## 2018-10-31 DIAGNOSIS — Z363 Encounter for antenatal screening for malformations: Secondary | ICD-10-CM | POA: Diagnosis not present

## 2018-10-31 DIAGNOSIS — Z3A18 18 weeks gestation of pregnancy: Secondary | ICD-10-CM | POA: Diagnosis not present

## 2018-11-22 ENCOUNTER — Other Ambulatory Visit: Payer: Self-pay | Admitting: Primary Care

## 2018-11-22 DIAGNOSIS — E119 Type 2 diabetes mellitus without complications: Secondary | ICD-10-CM

## 2018-11-22 DIAGNOSIS — E785 Hyperlipidemia, unspecified: Secondary | ICD-10-CM

## 2018-11-28 DIAGNOSIS — Z362 Encounter for other antenatal screening follow-up: Secondary | ICD-10-CM | POA: Diagnosis not present

## 2018-11-28 DIAGNOSIS — Z3A22 22 weeks gestation of pregnancy: Secondary | ICD-10-CM | POA: Diagnosis not present

## 2018-11-29 ENCOUNTER — Other Ambulatory Visit (INDEPENDENT_AMBULATORY_CARE_PROVIDER_SITE_OTHER): Payer: BLUE CROSS/BLUE SHIELD

## 2018-11-29 ENCOUNTER — Other Ambulatory Visit: Payer: BLUE CROSS/BLUE SHIELD

## 2018-11-29 ENCOUNTER — Other Ambulatory Visit: Payer: Self-pay

## 2018-11-29 DIAGNOSIS — E785 Hyperlipidemia, unspecified: Secondary | ICD-10-CM | POA: Diagnosis not present

## 2018-11-29 DIAGNOSIS — E119 Type 2 diabetes mellitus without complications: Secondary | ICD-10-CM | POA: Diagnosis not present

## 2018-11-29 LAB — LDL CHOLESTEROL, DIRECT: Direct LDL: 134 mg/dL

## 2018-11-29 LAB — LIPID PANEL
CHOLESTEROL: 233 mg/dL — AB (ref 0–200)
HDL: 50.9 mg/dL (ref >39.00)
NonHDL: 182.11
Total CHOL/HDL Ratio: 5
Triglycerides: 275 mg/dL — ABNORMAL HIGH (ref 0.0–149.0)
VLDL: 55 mg/dL — ABNORMAL HIGH (ref 0.0–40.0)

## 2018-11-29 LAB — HEMOGLOBIN A1C: Hgb A1c MFr Bld: 5.3 % (ref 4.6–6.5)

## 2018-12-02 ENCOUNTER — Ambulatory Visit: Payer: BLUE CROSS/BLUE SHIELD | Admitting: Primary Care

## 2018-12-02 ENCOUNTER — Encounter: Payer: Self-pay | Admitting: Primary Care

## 2018-12-02 ENCOUNTER — Other Ambulatory Visit: Payer: Self-pay

## 2018-12-02 DIAGNOSIS — E785 Hyperlipidemia, unspecified: Secondary | ICD-10-CM

## 2018-12-02 DIAGNOSIS — E119 Type 2 diabetes mellitus without complications: Secondary | ICD-10-CM | POA: Diagnosis not present

## 2018-12-02 DIAGNOSIS — I10 Essential (primary) hypertension: Secondary | ICD-10-CM | POA: Diagnosis not present

## 2018-12-02 NOTE — Assessment & Plan Note (Signed)
Stable in the office today, no longer on ACE due to pregnancy. Continue nifedipine XL 30 mg.

## 2018-12-02 NOTE — Assessment & Plan Note (Signed)
Well controlled with A1C of 5.3 on recent labs. Continue increased dose of Metformin 1000 mg in AM and 500 mg in PM.   Foot exam and pneumonia vaccination UTD. Eye exam due later this month. Continue off of statin and ACE given pregnancy.  Follow up in 6 months for diabetes check

## 2018-12-02 NOTE — Assessment & Plan Note (Signed)
Repeat lipids improved. No statin use due to recent pregnancy. Commended her on dietary changes and encouraged to continue.

## 2018-12-02 NOTE — Patient Instructions (Signed)
Continue Metformin 2 tablets in the morning and 1 tablet in the evening.  Continue to monitor your blood sugars as discussed.  Great job on diet and exercise!  Please schedule a follow up appointment in 6 months for follow up of blood pressure and diabetes.   It was a pleasure to see you today!

## 2018-12-02 NOTE — Progress Notes (Signed)
Subjective:    Patient ID: Kelly Gregory, female    DOB: 24-Jan-1986, 33 y.o.   MRN: [id number]  HPI  [name] is a 33 year old female who presents today for follow up of diabetes.  Current medications include: Metformin ER 1000 and 500 mg HS  She is checking her blood glucose 1-2 times daily and is getting readings of: AM fasting: 86-95 1 hour after breakfast: 110 1 hour after lunch: 96-120 1 hour after dinner: 130  Last A1C: 5.6 in September 2019, 5.3 recently Last Eye Exam: Due in March 2020 Last Foot Exam: Due in September 2020 Pneumonia Vaccination: Completed in 2017 ACE/ARB: None pregnancy Statin: None. Pregnancy   Diet currently consists of:  Breakfast: Fruit  Lunch: Restaurants, fast food Dinner: Meat, vegetable Snacks: Cracker Desserts: None Beverages: Water  Exercise: She is running and walking   BP Readings from Last 3 Encounters:  12/02/18 120/84  06/03/18 110/84  05/17/18 (!) 148/104     Review of Systems  Eyes: Negative for visual disturbance.  Respiratory: Negative for shortness of breath.   Cardiovascular: Negative for chest pain.  Neurological: Negative for dizziness and headaches.       Past Medical History:  Diagnosis Date  . Borderline diabetes   . DM (diabetes mellitus) (HCC)   . Elevated blood pressure    no meds  . Gestational diabetes   . Rheumatoid arthritis (HCC)    Years ago, does not follow, no meds  . Seasonal allergies   . Syncope    As a child, no problems during adulthood.     Social History   Socioeconomic History  . Marital status: Married    Spouse name: Not on file  . Number of children: Not on file  . Years of education: Not on file  . Highest education level: Not on file  Occupational History  . Not on file  Social Needs  . Financial resource strain: Not on file  . Food insecurity:    Worry: Not on file    Inability: Not on file  . Transportation needs:    Medical: Not on file    Non-medical: Not  on file  Tobacco Use  . Smoking status: Never Smoker  . Smokeless tobacco: Never Used  Substance and Sexual Activity  . Alcohol use: No  . Drug use: No  . Sexual activity: Yes    Comment: approx [redacted] wks gestation  Lifestyle  . Physical activity:    Days per week: Not on file    Minutes per session: Not on file  . Stress: Not on file  Relationships  . Social connections:    Talks on phone: Not on file    Gets together: Not on file    Attends religious service: Not on file    Active member of club or organization: Not on file    Attends meetings of clubs or organizations: Not on file    Relationship status: Not on file  . Intimate partner violence:    Fear of current or ex partner: Not on file    Emotionally abused: Not on file    Physically abused: Not on file    Forced sexual activity: Not on file  Other Topics Concern  . Not on file  Social History Narrative   Married.   2 children.   Works as a Paramedic.   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    Past Surgical History:  Procedure Laterality Date  .  CESAREAN SECTION  04/01/2011   Procedure: CESAREAN SECTION;  Surgeon: Oliver Pila;  Location: [facility];  Service: Gynecology;  Laterality: N/A;  . CESAREAN SECTION N/A 05/23/2013   Procedure: CESAREAN SECTION;  Surgeon: Lavina Hamman, MD;  Location: [facility];  Service: Obstetrics;  Laterality: N/A;  . CYSTECTOMY Left    removed from underarm   . DILATION AND EVACUATION N/A 02/01/2018   Procedure: DILATATION AND EVACUATION;  Surgeon: Huel Cote, MD;  Location: [facility];  Service: Gynecology;  Laterality: N/A;  . TOOTH EXTRACTION     wisdom teeth ext    Family History  Adopted: Yes  Problem Relation Age of Onset  . Other Unknown        Adopted    Allergies  Allergen Reactions  . Azithromycin Nausea And Vomiting  . Gatifloxacin Nausea And Vomiting and Other (See Comments)  . Tequin Nausea And Vomiting  . Penicillins Hives, Rash and Other (See  Comments)    Has patient had a PCN reaction causing immediate rash, facial/tongue/throat swelling, SOB or lightheadedness with hypotension: Unknown Has patient had a PCN reaction causing severe rash involving mucus membranes or skin necrosis: Unknown Has patient had a PCN reaction that required hospitalization: Unknown Has patient had a PCN reaction occurring within the last 10 years: No If all of the above answers are "NO", then may proceed with Cephalosporin use.     Current Outpatient Medications on File Prior to Visit  Medication Sig Dispense Refill  . acetaminophen (TYLENOL) 325 MG tablet Take 325 mg by mouth every 6 (six) hours as needed for moderate pain or headache.    . diphenhydrAMINE (BENADRYL) 12.5 MG/5ML liquid Take 6.25 mg by mouth 4 (four) times daily as needed for allergies.    . folic acid (FOLVITE) 400 MCG tablet Take 400 mcg by mouth daily.    Marland Kitchen ibuprofen (MOTRIN IB) 200 MG tablet Take 3 tablets (600 mg total) by mouth every 6 (six) hours as needed. 30 tablet 0  . metFORMIN (GLUCOPHAGE-XR) 500 MG 24 hr tablet Take 2 tablets (1,000 mg total) by mouth daily with breakfast. (Patient taking differently: Take 500 mg by mouth daily with breakfast. 1000mg  in AM and 500mg  in PM) 180 tablet 3  . NIFEdipine (PROCARDIA-XL/NIFEDICAL-XL) 30 MG 24 hr tablet nifedipine ER 30 mg tablet,extended release 24 hr     No current facility-administered medications on file prior to visit.     BP 120/84   Pulse 68   Temp 98 F (36.7 C) (Oral)   Wt 164 lb (74.4 kg)   LMP 05/30/2018   SpO2 98%   BMI 35.49 kg/m    Objective:   Physical Exam  Constitutional: She appears well-nourished.  Neck: Neck supple.  Cardiovascular: Normal rate and regular rhythm.  Respiratory: Effort normal and breath sounds normal.  Skin: Skin is warm and dry.  Psychiatric: She has a normal mood and affect.           Assessment & Plan:

## 2019-01-13 DIAGNOSIS — Z3689 Encounter for other specified antenatal screening: Secondary | ICD-10-CM | POA: Diagnosis not present

## 2019-01-13 DIAGNOSIS — Z23 Encounter for immunization: Secondary | ICD-10-CM | POA: Diagnosis not present

## 2019-02-09 DIAGNOSIS — O24113 Pre-existing diabetes mellitus, type 2, in pregnancy, third trimester: Secondary | ICD-10-CM | POA: Diagnosis not present

## 2019-02-09 DIAGNOSIS — Z3A32 32 weeks gestation of pregnancy: Secondary | ICD-10-CM | POA: Diagnosis not present

## 2019-02-13 DIAGNOSIS — O24112 Pre-existing diabetes mellitus, type 2, in pregnancy, second trimester: Secondary | ICD-10-CM | POA: Diagnosis not present

## 2019-02-13 DIAGNOSIS — Z3A33 33 weeks gestation of pregnancy: Secondary | ICD-10-CM | POA: Diagnosis not present

## 2019-02-16 DIAGNOSIS — O24113 Pre-existing diabetes mellitus, type 2, in pregnancy, third trimester: Secondary | ICD-10-CM | POA: Diagnosis not present

## 2019-02-16 DIAGNOSIS — Z3A33 33 weeks gestation of pregnancy: Secondary | ICD-10-CM | POA: Diagnosis not present

## 2019-02-20 DIAGNOSIS — Z3A34 34 weeks gestation of pregnancy: Secondary | ICD-10-CM | POA: Diagnosis not present

## 2019-02-20 DIAGNOSIS — O09513 Supervision of elderly primigravida, third trimester: Secondary | ICD-10-CM | POA: Diagnosis not present

## 2019-02-20 DIAGNOSIS — O24113 Pre-existing diabetes mellitus, type 2, in pregnancy, third trimester: Secondary | ICD-10-CM | POA: Diagnosis not present

## 2019-02-27 DIAGNOSIS — E119 Type 2 diabetes mellitus without complications: Secondary | ICD-10-CM | POA: Diagnosis not present

## 2019-02-27 DIAGNOSIS — Z3A35 35 weeks gestation of pregnancy: Secondary | ICD-10-CM | POA: Diagnosis not present

## 2019-03-02 DIAGNOSIS — O24113 Pre-existing diabetes mellitus, type 2, in pregnancy, third trimester: Secondary | ICD-10-CM | POA: Diagnosis not present

## 2019-03-02 DIAGNOSIS — Z3A35 35 weeks gestation of pregnancy: Secondary | ICD-10-CM | POA: Diagnosis not present

## 2019-03-06 DIAGNOSIS — Z3A36 36 weeks gestation of pregnancy: Secondary | ICD-10-CM | POA: Diagnosis not present

## 2019-03-06 DIAGNOSIS — O24113 Pre-existing diabetes mellitus, type 2, in pregnancy, third trimester: Secondary | ICD-10-CM | POA: Diagnosis not present

## 2019-03-07 DIAGNOSIS — Z3685 Encounter for antenatal screening for Streptococcus B: Secondary | ICD-10-CM | POA: Diagnosis not present

## 2019-03-10 DIAGNOSIS — O24415 Gestational diabetes mellitus in pregnancy, controlled by oral hypoglycemic drugs: Secondary | ICD-10-CM | POA: Diagnosis not present

## 2019-03-10 DIAGNOSIS — Z3A36 36 weeks gestation of pregnancy: Secondary | ICD-10-CM | POA: Diagnosis not present

## 2019-03-14 DIAGNOSIS — O24113 Pre-existing diabetes mellitus, type 2, in pregnancy, third trimester: Secondary | ICD-10-CM | POA: Diagnosis not present

## 2019-03-14 DIAGNOSIS — O10013 Pre-existing essential hypertension complicating pregnancy, third trimester: Secondary | ICD-10-CM | POA: Diagnosis not present

## 2019-03-14 DIAGNOSIS — Z3A37 37 weeks gestation of pregnancy: Secondary | ICD-10-CM | POA: Diagnosis not present

## 2019-03-16 ENCOUNTER — Encounter (HOSPITAL_COMMUNITY): Payer: Self-pay | Admitting: *Deleted

## 2019-03-16 DIAGNOSIS — Z3A37 37 weeks gestation of pregnancy: Secondary | ICD-10-CM | POA: Diagnosis not present

## 2019-03-16 DIAGNOSIS — O24113 Pre-existing diabetes mellitus, type 2, in pregnancy, third trimester: Secondary | ICD-10-CM | POA: Diagnosis not present

## 2019-03-16 NOTE — Patient Instructions (Signed)
Careli Luzader Hodkinson  03/16/2019   Your procedure is scheduled on:  03/27/2019  Arrive at 59 at [facility] on [address] at [facility]  and [address]. You are invited to use the FREE valet parking or use the Visitor's parking deck.  Pick up the phone at the desk and dial [phone].  Call this number if you have problems the morning of surgery: [phone]  Remember:   Do not eat food:(After Midnight) Desps de medianoche.  Do not drink clear liquids: (After Midnight) Desps de medianoche.  Take these medicines the morning of surgery with A SIP OF WATER:  Do not take Metformin the night before or the day of surgery.  Take your procardia as prescribed   Do not wear jewelry, make-up or nail polish.  Do not wear lotions, powders, or perfumes. Do not wear deodorant.  Do not shave 48 hours prior to surgery.  Do not bring valuables to the hospital.  [facility] is not   responsible for any belongings or valuables brought to the hospital.  Contacts, dentures or bridgework may not be worn into surgery.  Leave suitcase in the car. After surgery it may be brought to your room.  For patients admitted to the hospital, checkout time is 11:00 AM the day of              discharge.      Please read over the following fact sheets that you were given:     Preparing for Surgery

## 2019-03-21 DIAGNOSIS — O24113 Pre-existing diabetes mellitus, type 2, in pregnancy, third trimester: Secondary | ICD-10-CM | POA: Diagnosis not present

## 2019-03-21 DIAGNOSIS — O139 Gestational [pregnancy-induced] hypertension without significant proteinuria, unspecified trimester: Secondary | ICD-10-CM | POA: Diagnosis not present

## 2019-03-21 DIAGNOSIS — Z3A38 38 weeks gestation of pregnancy: Secondary | ICD-10-CM | POA: Diagnosis not present

## 2019-03-23 ENCOUNTER — Other Ambulatory Visit: Payer: Self-pay

## 2019-03-23 ENCOUNTER — Other Ambulatory Visit (HOSPITAL_COMMUNITY)
Admission: RE | Admit: 2019-03-23 | Discharge: 2019-03-23 | Disposition: A | Discharge status: Home / Self Care | Payer: BC Managed Care – PPO | Source: Ambulatory Visit | Attending: Obstetrics and Gynecology | Admitting: Obstetrics and Gynecology

## 2019-03-23 DIAGNOSIS — Z01812 Encounter for preprocedural laboratory examination: Secondary | ICD-10-CM | POA: Diagnosis not present

## 2019-03-23 DIAGNOSIS — Z1159 Encounter for screening for other viral diseases: Secondary | ICD-10-CM | POA: Insufficient documentation

## 2019-03-23 HISTORY — DX: Essential (primary) hypertension: I10

## 2019-03-23 NOTE — MAU Note (Signed)
Covid swab collected. PT tolerated well. PT asymptomatic 

## 2019-03-24 DIAGNOSIS — Z3A38 38 weeks gestation of pregnancy: Secondary | ICD-10-CM | POA: Diagnosis not present

## 2019-03-24 DIAGNOSIS — O24419 Gestational diabetes mellitus in pregnancy, unspecified control: Secondary | ICD-10-CM | POA: Diagnosis not present

## 2019-03-24 LAB — SARS CORONAVIRUS 2 (TAT 6-24 HRS): SARS Coronavirus 2: NEGATIVE

## 2019-03-26 NOTE — H&P (Signed)
Kelly Gregory is a 33 y.o. female [id number] at 34 2/7 weeks (EDD 04/01/19 by LMP c/w 9 week [place])  presenting for scheduled repeat c-section with prior h/o c-section x 2.  Prenatal care significant for:  AMA-low risk Panorama Type 2 DM-good glucose control on metformin 1000mg  po BID, baseline                                HgbA1C 5.2 CHTN-stable on procardia XL 30mg  po q AM, was on lisinopril prior to pregnancy LTCS x 2--for repeat  OB History    Gravida  5   Para  1   Term  1   Preterm      AB  2   Living  2     SAB  2   TAB      Ectopic      Multiple      Live Births  1         2012 LTCS 7#7 2014 LTCS 8#10 SAB x 3  Past Medical History:  Diagnosis Date  . Borderline diabetes   . DM (diabetes mellitus) (HCC)   . Elevated blood pressure    no meds  . Gestational diabetes   . Hypertension   . Rheumatoid arthritis (HCC)    Years ago, does not follow, no meds  . Seasonal allergies   . Syncope    As a child, no problems during adulthood.   Past Surgical History:  Procedure Laterality Date  . CESAREAN SECTION  04/01/2011   Procedure: CESAREAN SECTION;  Surgeon: 2015;  Location: [facility];  Service: Gynecology;  Laterality: N/A;  . CESAREAN SECTION N/A 05/23/2013   Procedure: CESAREAN SECTION;  Surgeon: Oliver Pila, MD;  Location: [facility];  Service: Obstetrics;  Laterality: N/A;  . CYSTECTOMY Left    removed from underarm   . DILATION AND EVACUATION N/A 02/01/2018   Procedure: DILATATION AND EVACUATION;  Surgeon: Lavina Hamman, MD;  Location: [facility];  Service: Gynecology;  Laterality: N/A;  . TOOTH EXTRACTION     wisdom teeth ext   Family History: family history includes Other in an other family member. She was adopted. Social History:  reports that she has never smoked. She has never used smokeless tobacco. She reports that she does not drink alcohol or use drugs.     Maternal Diabetes: Yes:  Diabetes Type:  Pre-pregnancy Genetic Screening:  Normal Maternal Ultrasounds/Referrals: Normal Fetal Ultrasounds or other Referrals:  None Maternal Substance Abuse:  No Significant Maternal Medications:  Meds include: Other:  Significant Maternal Lab Results:  None Other Comments:  None  Review of Systems  Eyes: Negative for blurred vision.  Cardiovascular: Negative for chest pain.  Gastrointestinal: Negative for abdominal pain.   Maternal Medical History:  Contractions: Frequency: rare.   Perceived severity is mild.    Fetal activity: Perceived fetal activity is normal.    Prenatal complications: PIH.   Prenatal Complications - Diabetes: type 2. Diabetes is managed by oral agent (monotherapy).        Last menstrual period 05/30/2018, unknown if currently breastfeeding. Maternal Exam:  Uterine Assessment: Contraction strength is mild.  Contraction frequency is rare.   Abdomen: Patient reports no abdominal tenderness. Surgical scars: low transverse.   Fetal presentation: vertex  Introitus: Normal vulva. Normal vagina.  Pelvis: of concern for delivery.      Physical Exam  Constitutional: She appears well-developed.  Cardiovascular:  Normal rate and regular rhythm.  Respiratory: Effort normal.  GI: Soft.  Genitourinary:    Vulva and vagina normal.   Neurological: She is alert.    Prenatal labs: ABO, Rh:  A pos Antibody:  neg Rubella:  Immune RPR:   NR HBsAg:   Neg HIV:   NR GBS:   Neg Panorama low risk Essential panle negative  Assessment/Plan:  d/w pt c-section procedure in detail.  Risks and benefits reviewed including bleeding, infection and possible damage to bowel and bladder.  Ready to proceed.   Logan Bores 03/26/2019, 8:07 AM

## 2019-03-26 NOTE — Anesthesia Preprocedure Evaluation (Addendum)
Anesthesia Evaluation  Patient identified by MRN, date of birth, ID band Patient awake    Reviewed: Allergy & Precautions, NPO status , Patient's Chart, lab work & pertinent test results  Airway Mallampati: II  TM Distance: >3 FB Neck ROM: Full    Dental no notable dental hx. (+) Teeth Intact   Pulmonary neg pulmonary ROS,    Pulmonary exam normal breath sounds clear to auscultation       Cardiovascular hypertension, Pt. on medications Normal cardiovascular exam Rhythm:Regular Rate:Normal     Neuro/Psych  Headaches, negative psych ROS   GI/Hepatic negative GI ROS, Neg liver ROS,   Endo/Other  diabetes, Type 2  Renal/GU negative Renal ROS     Musculoskeletal  (+) Arthritis , Rheumatoid disorders,    Abdominal   Peds  Hematology   Anesthesia Other Findings   Reproductive/Obstetrics (+) Pregnancy                            Lab Results  Component Value Date   WBC 8.0 03/27/2019   HGB 14.4 03/27/2019   HCT 42.3 03/27/2019   MCV 87.8 03/27/2019   PLT 235 03/27/2019    Anesthesia Physical Anesthesia Plan  ASA: III  Anesthesia Plan: Spinal   Post-op Pain Management:    Induction: Intravenous  PONV Risk Score and Plan: Treatment may vary due to age or medical condition, Dexamethasone and Ondansetron  Airway Management Planned: Natural Airway and Simple Face Mask  Additional Equipment:   Intra-op Plan:   Post-operative Plan:   Informed Consent:   Plan Discussed with:   Anesthesia Plan Comments:        Anesthesia Quick Evaluation

## 2019-03-27 ENCOUNTER — Inpatient Hospital Stay (HOSPITAL_COMMUNITY): Payer: BC Managed Care – PPO | Admitting: Anesthesiology

## 2019-03-27 ENCOUNTER — Inpatient Hospital Stay (HOSPITAL_COMMUNITY)
Admission: RE | Admit: 2019-03-27 | Discharge: 2019-03-29 | DRG: 786 | Disposition: A | Discharge status: Home / Self Care | Payer: BC Managed Care – PPO | Attending: Obstetrics and Gynecology | Admitting: Obstetrics and Gynecology

## 2019-03-27 ENCOUNTER — Encounter (HOSPITAL_COMMUNITY): Payer: Self-pay | Admitting: *Deleted

## 2019-03-27 ENCOUNTER — Encounter (HOSPITAL_COMMUNITY)
Admission: RE | Disposition: A | Discharge status: Home / Self Care | Payer: Self-pay | Source: Home / Self Care | Attending: Obstetrics and Gynecology

## 2019-03-27 DIAGNOSIS — O2412 Pre-existing diabetes mellitus, type 2, in childbirth: Secondary | ICD-10-CM | POA: Diagnosis present

## 2019-03-27 DIAGNOSIS — O2492 Unspecified diabetes mellitus in childbirth: Secondary | ICD-10-CM | POA: Diagnosis not present

## 2019-03-27 DIAGNOSIS — Z98891 History of uterine scar from previous surgery: Secondary | ICD-10-CM

## 2019-03-27 DIAGNOSIS — E119 Type 2 diabetes mellitus without complications: Secondary | ICD-10-CM | POA: Diagnosis present

## 2019-03-27 DIAGNOSIS — O34211 Maternal care for low transverse scar from previous cesarean delivery: Secondary | ICD-10-CM | POA: Diagnosis not present

## 2019-03-27 DIAGNOSIS — O1002 Pre-existing essential hypertension complicating childbirth: Secondary | ICD-10-CM | POA: Diagnosis present

## 2019-03-27 DIAGNOSIS — Z7984 Long term (current) use of oral hypoglycemic drugs: Secondary | ICD-10-CM

## 2019-03-27 DIAGNOSIS — Z3A39 39 weeks gestation of pregnancy: Secondary | ICD-10-CM

## 2019-03-27 DIAGNOSIS — O34219 Maternal care for unspecified type scar from previous cesarean delivery: Secondary | ICD-10-CM | POA: Diagnosis not present

## 2019-03-27 HISTORY — DX: History of uterine scar from previous surgery: Z98.891

## 2019-03-27 LAB — COMPREHENSIVE METABOLIC PANEL
ALT: 17 U/L (ref 0–44)
AST: 17 U/L (ref 15–41)
Albumin: 2.3 g/dL — ABNORMAL LOW (ref 3.5–5.0)
Alkaline Phosphatase: 211 U/L — ABNORMAL HIGH (ref 38–126)
Anion gap: 11 (ref 5–15)
BUN: 8 mg/dL (ref 6–20)
CO2: 19 mmol/L — ABNORMAL LOW (ref 22–32)
Calcium: 8.6 mg/dL — ABNORMAL LOW (ref 8.9–10.3)
Chloride: 105 mmol/L (ref 98–111)
Creatinine, Ser: 0.53 mg/dL (ref 0.44–1.00)
GFR calc Af Amer: >60 mL/min (ref >60)
GFR calc non Af Amer: >60 mL/min (ref >60)
Glucose, Bld: 91 mg/dL (ref 70–99)
Potassium: 3.8 mmol/L (ref 3.5–5.1)
Sodium: 135 mmol/L (ref 135–145)
Total Bilirubin: 0.6 mg/dL (ref 0.3–1.2)
Total Protein: 6.3 g/dL — ABNORMAL LOW (ref 6.5–8.1)

## 2019-03-27 LAB — ABO/RH: ABO/RH(D): A POS

## 2019-03-27 LAB — TYPE AND SCREEN
ABO/RH(D): A POS
Antibody Screen: NEGATIVE

## 2019-03-27 LAB — GLUCOSE, CAPILLARY
Glucose-Capillary: 85 mg/dL (ref 70–99)
Glucose-Capillary: 95 mg/dL (ref 70–99)
Glucose-Capillary: 133 mg/dL — ABNORMAL HIGH (ref 70–99)
Glucose-Capillary: 141 mg/dL — ABNORMAL HIGH (ref 70–99)

## 2019-03-27 LAB — CBC
HCT: 42.3 % (ref 36.0–46.0)
Hemoglobin: 14.4 g/dL (ref 12.0–15.0)
MCH: 29.9 pg (ref 26.0–34.0)
MCHC: 34 g/dL (ref 30.0–36.0)
MCV: 87.8 fL (ref 80.0–100.0)
Platelets: 235 10*3/uL (ref 150–400)
RBC: 4.82 MIL/uL (ref 3.87–5.11)
RDW: 13.9 % (ref 11.5–15.5)
WBC: 8 10*3/uL (ref 4.0–10.5)
nRBC: 0 % (ref 0.0–0.2)

## 2019-03-27 SURGERY — Surgical Case
Anesthesia: Spinal

## 2019-03-27 MED ORDER — NALOXONE HCL 4 MG/10ML IJ SOLN
1.0000 ug/kg/h | INTRAVENOUS | Status: DC | PRN
  Filled 2019-03-27: qty 5

## 2019-03-27 MED ORDER — NALBUPHINE HCL 10 MG/ML IJ SOLN: 5.0000 mg | INTRAMUSCULAR | Status: DC | PRN

## 2019-03-27 MED ORDER — SODIUM CHLORIDE 0.9 % IV SOLN
INTRAVENOUS | Status: DC | PRN
  Administered 2019-03-27: 10:00:00 via INTRAVENOUS

## 2019-03-27 MED ORDER — DIBUCAINE (PERIANAL) 1 % EX OINT: 1.0000 "application " | TOPICAL_OINTMENT | CUTANEOUS | Status: DC | PRN

## 2019-03-27 MED ORDER — MEPERIDINE HCL 25 MG/ML IJ SOLN: 6.2500 mg | INTRAMUSCULAR | Status: DC | PRN

## 2019-03-27 MED ORDER — NIFEDIPINE ER OSMOTIC RELEASE 30 MG PO TB24
30.0000 mg | ORAL_TABLET | Freq: Every day | ORAL | Status: DC
  Administered 2019-03-28 – 2019-03-29 (×2): 30 mg via ORAL
  Filled 2019-03-27 (×2): qty 1

## 2019-03-27 MED ORDER — NALOXONE HCL 0.4 MG/ML IJ SOLN: 0.4000 mg | INTRAMUSCULAR | Status: DC | PRN

## 2019-03-27 MED ORDER — OXYCODONE HCL 5 MG PO TABS: 5.0000 mg | ORAL_TABLET | ORAL | Status: DC | PRN

## 2019-03-27 MED ORDER — HYDROMORPHONE HCL 1 MG/ML IJ SOLN: 0.2500 mg | INTRAMUSCULAR | Status: DC | PRN

## 2019-03-27 MED ORDER — LACTATED RINGERS IV SOLN
INTRAVENOUS | Status: DC
  Administered 2019-03-27 (×2): via INTRAVENOUS

## 2019-03-27 MED ORDER — GENTAMICIN SULFATE 40 MG/ML IJ SOLN
5.0000 mg/kg | INTRAVENOUS | Status: AC
  Administered 2019-03-27: 10:00:00 295.2 mg via INTRAVENOUS
  Filled 2019-03-27: qty 7.5

## 2019-03-27 MED ORDER — METOCLOPRAMIDE HCL 5 MG/ML IJ SOLN
INTRAMUSCULAR | Status: DC | PRN
  Administered 2019-03-27 (×2): 5 mg via INTRAVENOUS

## 2019-03-27 MED ORDER — OXYTOCIN 40 UNITS IN NORMAL SALINE INFUSION - SIMPLE MED: 2.5000 [IU]/h | INTRAVENOUS | Status: AC

## 2019-03-27 MED ORDER — SCOPOLAMINE 1 MG/3DAYS TD PT72
1.0000 | MEDICATED_PATCH | Freq: Once | TRANSDERMAL | Status: DC
  Filled 2019-03-27: qty 1

## 2019-03-27 MED ORDER — NALBUPHINE HCL 10 MG/ML IJ SOLN: 5.0000 mg | Freq: Once | INTRAMUSCULAR | Status: DC | PRN

## 2019-03-27 MED ORDER — ACETAMINOPHEN 10 MG/ML IV SOLN
1000.0000 mg | Freq: Once | INTRAVENOUS | Status: DC | PRN
  Administered 2019-03-27: 1000 mg via INTRAVENOUS

## 2019-03-27 MED ORDER — LACTATED RINGERS IV SOLN
INTRAVENOUS | Status: DC
  Administered 2019-03-27 (×2): via INTRAVENOUS

## 2019-03-27 MED ORDER — COCONUT OIL OIL: 1.0000 "application " | TOPICAL_OIL | Status: DC | PRN

## 2019-03-27 MED ORDER — SODIUM CHLORIDE 0.9% FLUSH: 3.0000 mL | INTRAVENOUS | Status: DC | PRN

## 2019-03-27 MED ORDER — PHENYLEPHRINE HCL-NACL 20-0.9 MG/250ML-% IV SOLN
INTRAVENOUS | Status: DC | PRN
  Administered 2019-03-27: 60 ug/min via INTRAVENOUS
  Administered 2019-03-27: 90 ug/min via INTRAVENOUS
  Administered 2019-03-27: 60 ug/min via INTRAVENOUS

## 2019-03-27 MED ORDER — HYDROCODONE-ACETAMINOPHEN 7.5-325 MG PO TABS: 1.0000 | ORAL_TABLET | Freq: Once | ORAL | Status: DC | PRN

## 2019-03-27 MED ORDER — METFORMIN HCL 500 MG PO TABS
500.0000 mg | ORAL_TABLET | Freq: Two times a day (BID) | ORAL | Status: DC
  Administered 2019-03-27 – 2019-03-29 (×4): 500 mg via ORAL
  Filled 2019-03-27 (×5): qty 1

## 2019-03-27 MED ORDER — MORPHINE SULFATE (PF) 0.5 MG/ML IJ SOLN
INTRAMUSCULAR | Status: AC
  Filled 2019-03-27: qty 10

## 2019-03-27 MED ORDER — DIPHENHYDRAMINE HCL 25 MG PO CAPS: 25.0000 mg | ORAL_CAPSULE | Freq: Four times a day (QID) | ORAL | Status: DC | PRN

## 2019-03-27 MED ORDER — SENNOSIDES-DOCUSATE SODIUM 8.6-50 MG PO TABS
2.0000 | ORAL_TABLET | ORAL | Status: DC
  Administered 2019-03-28 – 2019-03-29 (×2): 2 via ORAL
  Filled 2019-03-27 (×2): qty 2

## 2019-03-27 MED ORDER — DIPHENHYDRAMINE HCL 50 MG/ML IJ SOLN: 12.5000 mg | INTRAMUSCULAR | Status: DC | PRN

## 2019-03-27 MED ORDER — SIMETHICONE 80 MG PO CHEW: 80.0000 mg | CHEWABLE_TABLET | ORAL | Status: DC | PRN

## 2019-03-27 MED ORDER — CLINDAMYCIN PHOSPHATE 900 MG/50ML IV SOLN
INTRAVENOUS | Status: AC
  Filled 2019-03-27: qty 50

## 2019-03-27 MED ORDER — PHENYLEPHRINE 40 MCG/ML (10ML) SYRINGE FOR IV PUSH (FOR BLOOD PRESSURE SUPPORT)
PREFILLED_SYRINGE | INTRAVENOUS | Status: AC
  Filled 2019-03-27: qty 10

## 2019-03-27 MED ORDER — SIMETHICONE 80 MG PO CHEW
80.0000 mg | CHEWABLE_TABLET | Freq: Three times a day (TID) | ORAL | Status: DC
  Administered 2019-03-27 – 2019-03-29 (×5): 80 mg via ORAL
  Filled 2019-03-27 (×5): qty 1

## 2019-03-27 MED ORDER — PRENATAL MULTIVITAMIN CH
1.0000 | ORAL_TABLET | Freq: Every day | ORAL | Status: DC
  Administered 2019-03-28 – 2019-03-29 (×2): 1 via ORAL
  Filled 2019-03-27 (×2): qty 1

## 2019-03-27 MED ORDER — BUPIVACAINE IN DEXTROSE 0.75-8.25 % IT SOLN
INTRATHECAL | Status: DC | PRN
  Administered 2019-03-27: 1.5 mg via INTRATHECAL

## 2019-03-27 MED ORDER — ACETAMINOPHEN 500 MG PO TABS
1000.0000 mg | ORAL_TABLET | Freq: Four times a day (QID) | ORAL | Status: DC
  Administered 2019-03-27 – 2019-03-29 (×7): 1000 mg via ORAL
  Filled 2019-03-27 (×7): qty 2

## 2019-03-27 MED ORDER — OXYTOCIN 40 UNITS IN NORMAL SALINE INFUSION - SIMPLE MED
INTRAVENOUS | Status: AC
  Filled 2019-03-27: qty 1000

## 2019-03-27 MED ORDER — METOCLOPRAMIDE HCL 5 MG/ML IJ SOLN
INTRAMUSCULAR | Status: AC
  Filled 2019-03-27: qty 2

## 2019-03-27 MED ORDER — WITCH HAZEL-GLYCERIN EX PADS: 1.0000 "application " | MEDICATED_PAD | CUTANEOUS | Status: DC | PRN

## 2019-03-27 MED ORDER — MORPHINE SULFATE (PF) 0.5 MG/ML IJ SOLN
INTRAMUSCULAR | Status: DC | PRN
  Administered 2019-03-27: .15 mg via INTRATHECAL

## 2019-03-27 MED ORDER — ONDANSETRON HCL 4 MG/2ML IJ SOLN
4.0000 mg | Freq: Three times a day (TID) | INTRAMUSCULAR | Status: DC | PRN
  Administered 2019-03-27: 4 mg via INTRAVENOUS
  Filled 2019-03-27: qty 2

## 2019-03-27 MED ORDER — ONDANSETRON HCL 4 MG/2ML IJ SOLN
INTRAMUSCULAR | Status: AC
  Filled 2019-03-27: qty 2

## 2019-03-27 MED ORDER — PHENYLEPHRINE HCL-NACL 20-0.9 MG/250ML-% IV SOLN
INTRAVENOUS | Status: AC
  Filled 2019-03-27: qty 250

## 2019-03-27 MED ORDER — MENTHOL 3 MG MT LOZG: 1.0000 | LOZENGE | OROMUCOSAL | Status: DC | PRN

## 2019-03-27 MED ORDER — SIMETHICONE 80 MG PO CHEW
80.0000 mg | CHEWABLE_TABLET | ORAL | Status: DC
  Administered 2019-03-28 – 2019-03-29 (×2): 80 mg via ORAL
  Filled 2019-03-27 (×2): qty 1

## 2019-03-27 MED ORDER — IBUPROFEN 800 MG PO TABS
800.0000 mg | ORAL_TABLET | Freq: Three times a day (TID) | ORAL | Status: DC
  Administered 2019-03-28 – 2019-03-29 (×3): 800 mg via ORAL
  Filled 2019-03-27 (×3): qty 1

## 2019-03-27 MED ORDER — SODIUM CHLORIDE 0.9 % IV SOLN
INTRAVENOUS | Status: DC | PRN
  Administered 2019-03-27: 10:00:00 40 [IU] via INTRAVENOUS

## 2019-03-27 MED ORDER — ZOLPIDEM TARTRATE 5 MG PO TABS: 5.0000 mg | ORAL_TABLET | Freq: Every evening | ORAL | Status: DC | PRN

## 2019-03-27 MED ORDER — FENTANYL CITRATE (PF) 100 MCG/2ML IJ SOLN
INTRAMUSCULAR | Status: AC
  Filled 2019-03-27: qty 2

## 2019-03-27 MED ORDER — ACETAMINOPHEN 10 MG/ML IV SOLN
INTRAVENOUS | Status: AC
  Filled 2019-03-27: qty 100

## 2019-03-27 MED ORDER — KETOROLAC TROMETHAMINE 30 MG/ML IJ SOLN: 30.0000 mg | Freq: Four times a day (QID) | INTRAMUSCULAR | Status: AC | PRN

## 2019-03-27 MED ORDER — CLINDAMYCIN PHOSPHATE 900 MG/50ML IV SOLN
900.0000 mg | INTRAVENOUS | Status: AC
  Administered 2019-03-27: 10:00:00 900 mg via INTRAVENOUS

## 2019-03-27 MED ORDER — KETOROLAC TROMETHAMINE 30 MG/ML IJ SOLN
30.0000 mg | Freq: Four times a day (QID) | INTRAMUSCULAR | Status: AC | PRN
  Administered 2019-03-27 – 2019-03-28 (×2): 30 mg via INTRAVENOUS
  Filled 2019-03-27 (×2): qty 1

## 2019-03-27 MED ORDER — ONDANSETRON HCL 4 MG/2ML IJ SOLN: 4.0000 mg | Freq: Once | INTRAMUSCULAR | Status: DC | PRN

## 2019-03-27 MED ORDER — ONDANSETRON HCL 4 MG/2ML IJ SOLN
INTRAMUSCULAR | Status: DC | PRN
  Administered 2019-03-27: 4 mg via INTRAVENOUS

## 2019-03-27 MED ORDER — DIPHENHYDRAMINE HCL 25 MG PO CAPS: 25.0000 mg | ORAL_CAPSULE | ORAL | Status: DC | PRN

## 2019-03-27 MED ORDER — TETANUS-DIPHTH-ACELL PERTUSSIS 5-2.5-18.5 LF-MCG/0.5 IM SUSP: 0.5000 mL | Freq: Once | INTRAMUSCULAR | Status: DC

## 2019-03-27 MED ORDER — FENTANYL CITRATE (PF) 100 MCG/2ML IJ SOLN
INTRAMUSCULAR | Status: DC | PRN
  Administered 2019-03-27: 15 ug via INTRATHECAL

## 2019-03-27 SURGICAL SUPPLY — 37 items
BENZOIN TINCTURE PRP APPL 2/3 (GAUZE/BANDAGES/DRESSINGS) ×3 IMPLANT
CHLORAPREP W/TINT 26ML (MISCELLANEOUS) ×3 IMPLANT
CLAMP CORD UMBIL (MISCELLANEOUS) IMPLANT
CLOSURE WOUND 1/2 X4 (GAUZE/BANDAGES/DRESSINGS)
CLOTH BEACON ORANGE TIMEOUT ST (SAFETY) ×3 IMPLANT
DRSG OPSITE POSTOP 4X10 (GAUZE/BANDAGES/DRESSINGS) ×6 IMPLANT
ELECT REM PT RETURN 9FT ADLT (ELECTROSURGICAL) ×3
ELECTRODE REM PT RTRN 9FT ADLT (ELECTROSURGICAL) ×1 IMPLANT
EXTRACTOR VACUUM KIWI (MISCELLANEOUS) IMPLANT
GLOVE BIO SURGEON STRL SZ 6.5 (GLOVE) ×2 IMPLANT
GLOVE BIO SURGEONS STRL SZ 6.5 (GLOVE) ×1
GLOVE BIOGEL PI IND STRL 7.0 (GLOVE) ×1 IMPLANT
GLOVE BIOGEL PI INDICATOR 7.0 (GLOVE) ×2
GOWN STRL REUS W/TWL LRG LVL3 (GOWN DISPOSABLE) ×6 IMPLANT
KIT ABG SYR 3ML LUER SLIP (SYRINGE) IMPLANT
NEEDLE HYPO 25X5/8 SAFETYGLIDE (NEEDLE) IMPLANT
NS IRRIG 1000ML POUR BTL (IV SOLUTION) ×3 IMPLANT
PACK C SECTION WH (CUSTOM PROCEDURE TRAY) ×3 IMPLANT
PAD OB MATERNITY 4.3X12.25 (PERSONAL CARE ITEMS) ×3 IMPLANT
PENCIL SMOKE EVAC W/HOLSTER (ELECTROSURGICAL) ×3 IMPLANT
RTRCTR C-SECT PINK 25CM LRG (MISCELLANEOUS) ×3 IMPLANT
STRIP CLOSURE SKIN 1/2X4 (GAUZE/BANDAGES/DRESSINGS) IMPLANT
SUT CHROMIC 1 CTX 36 (SUTURE) ×6 IMPLANT
SUT PLAIN 0 NONE (SUTURE) IMPLANT
SUT PLAIN 2 0 XLH (SUTURE) ×3 IMPLANT
SUT VIC AB 0 CT1 27 (SUTURE) ×4
SUT VIC AB 0 CT1 27XBRD ANBCTR (SUTURE) ×2 IMPLANT
SUT VIC AB 2-0 CT1 27 (SUTURE) ×2
SUT VIC AB 2-0 CT1 TAPERPNT 27 (SUTURE) ×1 IMPLANT
SUT VIC AB 3-0 CT1 27 (SUTURE)
SUT VIC AB 3-0 CT1 TAPERPNT 27 (SUTURE) IMPLANT
SUT VIC AB 3-0 SH 27 (SUTURE) ×2
SUT VIC AB 3-0 SH 27X BRD (SUTURE) ×1 IMPLANT
SUT VIC AB 4-0 KS 27 (SUTURE) ×3 IMPLANT
TOWEL OR 17X24 6PK STRL BLUE (TOWEL DISPOSABLE) ×3 IMPLANT
TRAY FOLEY W/BAG SLVR 14FR LF (SET/KITS/TRAYS/PACK) ×3 IMPLANT
WATER STERILE IRR 1000ML POUR (IV SOLUTION) ×3 IMPLANT

## 2019-03-27 NOTE — Anesthesia Procedure Notes (Signed)
Spinal  Patient location during procedure: OB Start time: 03/27/2019 9:55 AM End time: 03/27/2019 10:01 AM Staffing Anesthesiologist: Barnet Glasgow, MD Performed: anesthesiologist  Preanesthetic Checklist Completed: patient identified, surgical consent, pre-op evaluation, timeout performed, IV checked, risks and benefits discussed and monitors and equipment checked Spinal Block Patient position: sitting Prep: site prepped and draped and DuraPrep Patient monitoring: heart rate, cardiac monitor, continuous pulse ox and blood pressure Approach: midline Location: L3-4 Injection technique: single-shot Needle Needle type: Pencan  Needle gauge: 24 G Needle length: 10 cm Needle insertion depth: 6 cm Assessment Sensory level: T4 Additional Notes 1 Attempt (s). Pt tolerated procedure well.

## 2019-03-27 NOTE — Anesthesia Postprocedure Evaluation (Signed)
Anesthesia Post Note  Patient: Kelly Gregory  Procedure(s) Performed: CESAREAN SECTION (N/A )     Patient location during evaluation: Mother Baby Anesthesia Type: Spinal Level of consciousness: oriented and awake and alert Pain management: pain level controlled Vital Signs Assessment: post-procedure vital signs reviewed and stable Respiratory status: spontaneous breathing and respiratory function stable Cardiovascular status: blood pressure returned to baseline and stable Postop Assessment: no headache, no backache, no apparent nausea or vomiting and able to ambulate Anesthetic complications: no    Last Vitals:  Vitals:   03/27/19 1241 03/27/19 1320  BP:  115/87  Pulse:  68  Resp:  18  Temp: (!) 36.4 C (!) 36.2 C  SpO2:  98%    Last Pain:  Vitals:   03/27/19 1320  TempSrc: Axillary  PainSc:    Pain Goal:                   Barnet Glasgow

## 2019-03-27 NOTE — Progress Notes (Signed)
Patient ID: Kelly Gregory, female   DOB: 1986-04-04, 33 y.o.   MRN: [id number] Pt examined and brief exam WNL.  BS 84.  Questions answered and ready to proceed.

## 2019-03-27 NOTE — Progress Notes (Addendum)
Patient ID: Kelly Gregory, female   DOB: [birth year], 33 y.o.   MRN: [id number]  POD#0 Pt doing well with no complaints. Pain well controlled Bonding well with baby  VSS Routine pp/post op care

## 2019-03-27 NOTE — Transfer of Care (Signed)
Immediate Anesthesia Transfer of Care Note  Patient: Kelly Gregory  Procedure(s) Performed: CESAREAN SECTION (N/A )  Patient Location: PACU  Anesthesia Type:Spinal  Level of Consciousness: awake  Airway & Oxygen Therapy: Patient Spontanous Breathing  Post-op Assessment: Report given to RN  Post vital signs: Reviewed and stable  Last Vitals:  Vitals Value Taken Time  BP    Temp    Pulse    Resp    SpO2      Last Pain:  Vitals:   03/27/19 0812  TempSrc: Oral         Complications: No apparent anesthesia complications

## 2019-03-27 NOTE — Op Note (Signed)
Operative Note    Preoperative Diagnosis Term Pregnancy at 39 2/7 weeks Type 2 Diabetes Prior c-section x 2 Chronic hypertension  Postoperative Diagnosis same  Procedure Repeat low transverse c-section with 2-layer closure of uterus  Surgeon Juliann Pulse Clemie General,MD Claretta Fraise, RNFA  Anesthesia Spinal  Fluids: EBL 288ml LR UOP 246mL clear IVF 373mL  Findings A viable female infant in the vertex presentation.  Apgars 8,9 Weight pending.  Normal uterus ovaries and tubes  Specimen Placenta to L&D  Procedure Note Patient was taken to the operating room where spinal anesthesia was obtained and found to be adequate by Allis clamp test. She was prepped and draped in the normal sterile fashion in the dorsal supine position with a leftward tilt. An appropriate time out was performed. A Pfannenstiel skin incision was then made through a pre-existing scar with the scalpel and carried through to the underlying layer of fascia by sharp dissection and Bovie cautery. The fascia was nicked in the midline and the incision was extended laterally with Mayo scissors. The inferior aspect of the incision was grasped Coker clamps and dissected off the underlying rectus muscles. In a similar fashion the superior aspect was dissected off the rectus muscles. Rectus muscles were separated in the midline and the peritoneal cavity entered bluntly. The peritoneal incision was then extended both superiorly and inferiorly with careful attention to avoid both bowel and bladder. The Alexis self-retaining wound retractor was then placed within the incision and the lower uterine segment exposed. The bladder flap was developed with Metzenbaum scissors and pushed away from the lower uterine segment. The lower uterine segment was then incised in a transverse fashion and the cavity itself entered bluntly. The incision was extended bluntly. The infant's head was then lifted and delivered from the incision without  difficulty. The remainder of the infant delivered and the nose and mouth bulb suctioned with the cord clamped and cut as well. The infant was handed off to the waiting pediatricians. The placenta was then spontaneously expressed from the uterus and the uterus cleared of all clots and debris with moist lap sponge. The uterine incision was then repaired in 2 layers the first layer was a running locked layer of 1-0 chromic and the second an imbricating layer of the same suture. The tubes and ovaries were inspected and the gutters cleared of all clots and debris. The uterine incision was inspected and found to be hemostatic. All instruments and sponges as well as the Alexis retractor were then removed from the abdomen. The rectus muscles and peritoneum were then reapproximated with a running suture of 2-0 Vicryl. The fascia was then closed with 0 Vicryl in a running fashion. Subcutaneous tissue was reapproximated with 3-0 plain in a running fashion. The skin was closed with a subcuticular stitch of 4-0 Vicryl on a Keith needle and then reinforced with benzoin and Steri-Strips. At the conclusion of the procedure all instruments and sponge counts were correct. Patient was taken to the recovery room in good condition with her baby accompanying her skin to skin.   D/w parents circumcision and they decline

## 2019-03-27 NOTE — Lactation Note (Signed)
This note was copied from a baby's chart. Lactation Consultation Note  Patient Name: Kelly Gregory Date: 03/27/2019 Reason for consult: Initial assessment;Term   LC Initial Visit:  Attempted to visit with mother, however, RN in room and will be working on assisting mother up for the first time.  LC to visit later today.    Consult Status Consult Status: Follow-up Date: 03/27/19 Follow-up type: In-patient    Kelly Gregory 03/27/2019, 6:33 PM

## 2019-03-28 LAB — CBC
HCT: 36 % (ref 36.0–46.0)
Hemoglobin: 12.3 g/dL (ref 12.0–15.0)
MCH: 29.5 pg (ref 26.0–34.0)
MCHC: 34.2 g/dL (ref 30.0–36.0)
MCV: 86.3 fL (ref 80.0–100.0)
Platelets: 203 10*3/uL (ref 150–400)
RBC: 4.17 MIL/uL (ref 3.87–5.11)
RDW: 14 % (ref 11.5–15.5)
WBC: 9.8 10*3/uL (ref 4.0–10.5)
nRBC: 0 % (ref 0.0–0.2)

## 2019-03-28 LAB — GLUCOSE, CAPILLARY
Glucose-Capillary: 124 mg/dL — ABNORMAL HIGH (ref 70–99)
Glucose-Capillary: 108 mg/dL — ABNORMAL HIGH (ref 70–99)
Glucose-Capillary: 178 mg/dL — ABNORMAL HIGH (ref 70–99)

## 2019-03-28 LAB — BIRTH TISSUE RECOVERY COLLECTION (PLACENTA DONATION)

## 2019-03-28 LAB — RPR: RPR Ser Ql: NONREACTIVE

## 2019-03-28 NOTE — Progress Notes (Signed)
Subjective: Postpartum Day 1: Cesarean Delivery Patient reports tolerating PO and no problems voiding.    Objective: Vital signs in last 24 hours: Temp:  [96.7 F (35.9 C)-98.6 F (37 C)] 98.6 F (37 C) (07/14 0910) Pulse Rate:  [61-96] 74 (07/14 0910) Resp:  [13-22] 16 (07/14 0910) BP: (103-141)/(63-94) 132/94 (07/14 0910) SpO2:  [93 %-99 %] 97 % (07/14 0910)  Physical Exam:  General: alert and cooperative Lochia: appropriate Uterine Fundus: firm Incision: C/D/I   Recent Labs    03/27/19 0813 03/28/19 0535  HGB 14.4 12.3  HCT 42.3 36.0    Assessment/Plan: Status post Cesarean section. Doing well postoperatively.  Continue current care.  Plan d/c tomorrow BP stable on procardia so far, will monitor to see if needs increase BS WNL thus far on metformin 500mg  po BID (was dose prior to pregnancy)  Pt will schedule f/u with Dr. Chalmers Cater in next 2-3 months  Kelly Gregory 03/28/2019, 9:18 AM

## 2019-03-28 NOTE — Lactation Note (Signed)
This note was copied from a baby's chart. Lactation Consultation Note Baby 46 hrs old. Mom states BF well. Mom exclusively pumped for her 1st child 1 yr. BF her 2nd child 2 yrs. Mom states BF going well w/this baby. Mom has everted nipples, shaped well after came off breast. Denies painful latching. Encouraged breast massage, STS, I&O.  Reviewed newborn behavior and feeding habits. Encouraged mom to call for questions or concerns.  Patient Name: Kelly Gregory LEXNT'Z Date: 03/28/2019 Reason for consult: Initial assessment;Maternal endocrine disorder;Term Type of Endocrine Disorder?: Diabetes   Maternal Data Has patient been taught Hand Expression?: Yes Does the patient have breastfeeding experience prior to this delivery?: Yes  Feeding Feeding Type: Breast Fed  LATCH Score Latch: Grasps breast easily, tongue down, lips flanged, rhythmical sucking.  Audible Swallowing: A few with stimulation  Type of Nipple: Everted at rest and after stimulation  Comfort (Breast/Nipple): Soft / non-tender  Hold (Positioning): No assistance needed to correctly position infant at breast.  LATCH Score: 9  Interventions Interventions: Breast feeding basics reviewed;Skin to skin;Breast massage  Lactation Tools Discussed/Used     Consult Status Consult Status: Follow-up Date: 03/29/19 Follow-up type: In-patient    Theodoro Kalata 03/28/2019, 12:57 AM

## 2019-03-29 LAB — GLUCOSE, CAPILLARY: Glucose-Capillary: 100 mg/dL — ABNORMAL HIGH (ref 70–99)

## 2019-03-29 MED ORDER — PRENATAL MULTIVITAMIN CH: 1.0000 | ORAL_TABLET | Freq: Every day | ORAL | 3 refills | Status: DC

## 2019-03-29 MED ORDER — METFORMIN HCL ER 500 MG PO TB24: 500.0000 mg | ORAL_TABLET | Freq: Two times a day (BID) | ORAL | 3 refills | Status: DC

## 2019-03-29 MED ORDER — OXYCODONE HCL 5 MG PO TABS: 5.0000 mg | ORAL_TABLET | Freq: Four times a day (QID) | ORAL | 0 refills | Status: DC | PRN

## 2019-03-29 MED ORDER — IBUPROFEN 800 MG PO TABS: 800.0000 mg | ORAL_TABLET | Freq: Three times a day (TID) | ORAL | 1 refills | Status: DC

## 2019-03-29 NOTE — Progress Notes (Signed)
Dr. Melba Coon notified of patients BP. Per Dr. Melba Coon it is ok to still discharge patient. She wants the patient to check her BP at home and if systolic is 537 or higher and diastolic is 482 or higher she is to call the office. Patient verbalizes understanding. Timoteo Ace, RN

## 2019-03-29 NOTE — Discharge Summary (Signed)
OB Discharge Summary     Patient Name: Kelly Gregory DOB: 1985-12-08 MRN: [id number]  Date of admission: 03/27/2019 Delivering MD: Huel Cote   Date of discharge: 03/29/2019  Admitting diagnosis: repeat C-Section Intrauterine pregnancy: [redacted]w[redacted]d     Secondary diagnosis:  Active Problems:   S/P repeat low transverse C-section  Additional problems: PIH, GDM     Discharge diagnosis: Term Pregnancy Delivered                                                                                                Post partum procedures:N/A  Augmentation: N/A  Complications: None  Hospital course:  Sceduled C/S   33 y.o. yo [id number] at [redacted]w[redacted]d was admitted to the hospital 03/27/2019 for scheduled cesarean section with the following indication:Elective Repeat.  Membrane Rupture Time/Date: 10:23 AM ,03/27/2019   Patient delivered a Viable infant.03/27/2019  Details of operation can be found in separate operative note.  Pateint had an uncomplicated postpartum course.  She is ambulating, tolerating a regular diet, passing flatus, and urinating well. Patient is discharged home in stable condition on  03/29/19         Physical exam  Vitals:   03/28/19 0500 03/28/19 0910 03/28/19 1418 03/29/19 0027  BP: 119/73 (!) 132/94 (!) 144/92 (!) 140/91  Pulse: 67 74 87 78  Resp: 16 16 20 16   Temp: 98 F (36.7 C) 98.6 F (37 C) 97.6 F (36.4 C) 98.2 F (36.8 C)  TempSrc: Oral Oral Oral Oral  SpO2: 97% 97% 98% 99%  Weight:      Height:       General: alert and no distress Lochia: appropriate Uterine Fundus: firm Incision: Healing well with no significant drainage DVT Evaluation: No evidence of DVT seen on physical exam. Labs: Lab Results  Component Value Date   WBC 9.8 03/28/2019   HGB 12.3 03/28/2019   HCT 36.0 03/28/2019   MCV 86.3 03/28/2019   PLT 203 03/28/2019   CMP Latest Ref Rng & Units 03/27/2019  Glucose 70 - 99 mg/dL 91  BUN 6 - 20 mg/dL 8  Creatinine 03/29/2019 - 5.88 mg/dL 3.25   Sodium 4.98 - 264 mmol/L 135  Potassium 3.5 - 5.1 mmol/L 3.8  Chloride 98 - 111 mmol/L 105  CO2 22 - 32 mmol/L 19(L)  Calcium 8.9 - 10.3 mg/dL 158)  Total Protein 6.5 - 8.1 g/dL 6.3(L)  Total Bilirubin 0.3 - 1.2 mg/dL 0.6  Alkaline Phos 38 - 126 U/L 211(H)  AST 15 - 41 U/L 17  ALT 0 - 44 U/L 17    Discharge instruction: per After Visit Summary and "Baby and Me Booklet".  After visit meds:  Allergies as of 03/29/2019      Reactions   Azithromycin Nausea And Vomiting   Gatifloxacin Nausea And Vomiting, Other (See Comments)   Tequin Nausea And Vomiting   Penicillins Hives, Rash, Other (See Comments)   Has patient had a PCN reaction causing immediate rash, facial/tongue/throat swelling, SOB or lightheadedness with hypotension: Unknown Has patient had a PCN reaction causing severe rash involving mucus membranes or skin  necrosis: Unknown Has patient had a PCN reaction that required hospitalization: Unknown Has patient had a PCN reaction occurring within the last 10 years: No If all of the above answers are "NO", then may proceed with Cephalosporin use.      Medication List    TAKE these medications   folic acid 035 MCG tablet Commonly known as: FOLVITE Take 400 mcg by mouth daily.   ibuprofen 800 MG tablet Commonly known as: ADVIL Take 1 tablet (800 mg total) by mouth every 8 (eight) hours.   metFORMIN 500 MG 24 hr tablet Commonly known as: GLUCOPHAGE-XR Take 1 tablet (500 mg total) by mouth 2 (two) times a day. What changed:   how much to take  when to take this   NIFEdipine 30 MG 24 hr tablet Commonly known as: PROCARDIA-XL/NIFEDICAL-XL Take 30 mg by mouth daily.   oxyCODONE 5 MG immediate release tablet Commonly known as: Oxy IR/ROXICODONE Take 1-2 tablets (5-10 mg total) by mouth every 6 (six) hours as needed for moderate pain or severe pain.   prenatal multivitamin Tabs tablet Take 1 tablet by mouth daily at 12 noon.       Diet: routine diet  Activity:  Advance as tolerated. Pelvic rest for 6 weeks.   Outpatient follow up:1,2 and 6 weeks Follow up Appt: Future Appointments  Date Time Provider [place]  06/05/2019  9:40 AM Pleas Koch, NP [facility]   Follow up Visit:No follow-ups on file.  Postpartum contraception: Undecided  Newborn Data: Live born female  Birth Weight: 8 lb 9.2 oz (3890 g) APGAR: 62, 9  Newborn Delivery   Birth date/time: 03/27/2019 10:24:00 Delivery type: C-Section, Low Transverse Trial of labor: No C-section categorization: Repeat      Baby Feeding: Breast Disposition:home with mother   03/29/2019 Janyth Contes, MD

## 2019-03-29 NOTE — Progress Notes (Signed)
Subjective: Postpartum Day 2: Cesarean Delivery Patient reports incisional pain, tolerating PO and no problems voiding  Nl lochia, pain controlled.    Objective: Vital signs in last 24 hours: Temp:  [97.6 F (36.4 C)-98.6 F (37 C)] 98.2 F (36.8 C) (07/15 0027) Pulse Rate:  [74-87] 78 (07/15 0027) Resp:  [16-20] 16 (07/15 0027) BP: (132-144)/(91-94) 140/91 (07/15 0027) SpO2:  [97 %-99 %] 99 % (07/15 0027)  Physical Exam:  General: alert and no distress Lochia: appropriate Uterine Fundus: firm Incision: healing well DVT Evaluation: No evidence of DVT seen on physical exam.  Recent Labs    03/27/19 0813 03/28/19 0535  HGB 14.4 12.3  HCT 42.3 36.0    Assessment/Plan: Status post Cesarean section. Doing well postoperatively. BP mildly elevated - closely monitor - recheck in office 1 week Continue current care - desires d/c home D/c with Motrin, oxycodone andf PNV F/u 1,2 and 6 weeks  Kelly Gregory 03/29/2019, 7:32 AM

## 2019-04-12 ENCOUNTER — Other Ambulatory Visit: Payer: Self-pay | Admitting: Primary Care

## 2019-04-12 DIAGNOSIS — I10 Essential (primary) hypertension: Secondary | ICD-10-CM

## 2019-04-25 IMAGING — US ULTRASOUND LEFT BREAST LIMITED
1 series · 2 of 2 positions shown · non-contrast
Comparison: Previous exam(s).

CLINICAL DATA: Patient's referring clinician reports a palpable
lump along the upper lateral left breast. Patient is currently
breast feeding.

EXAM:
ULTRASOUND OF THE LEFT BREAST

[Series 1: ultrasound left breast limited · 0.07mm/px · 2 of 2 slices shown]
[im 1/2]
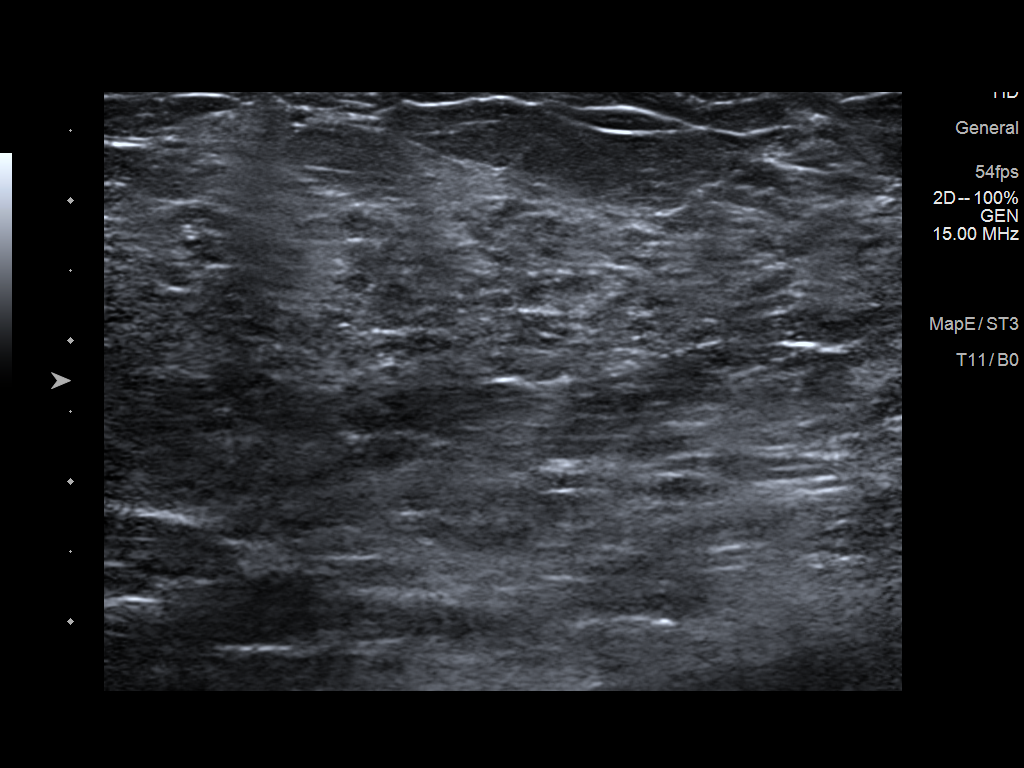
[im 2/2]
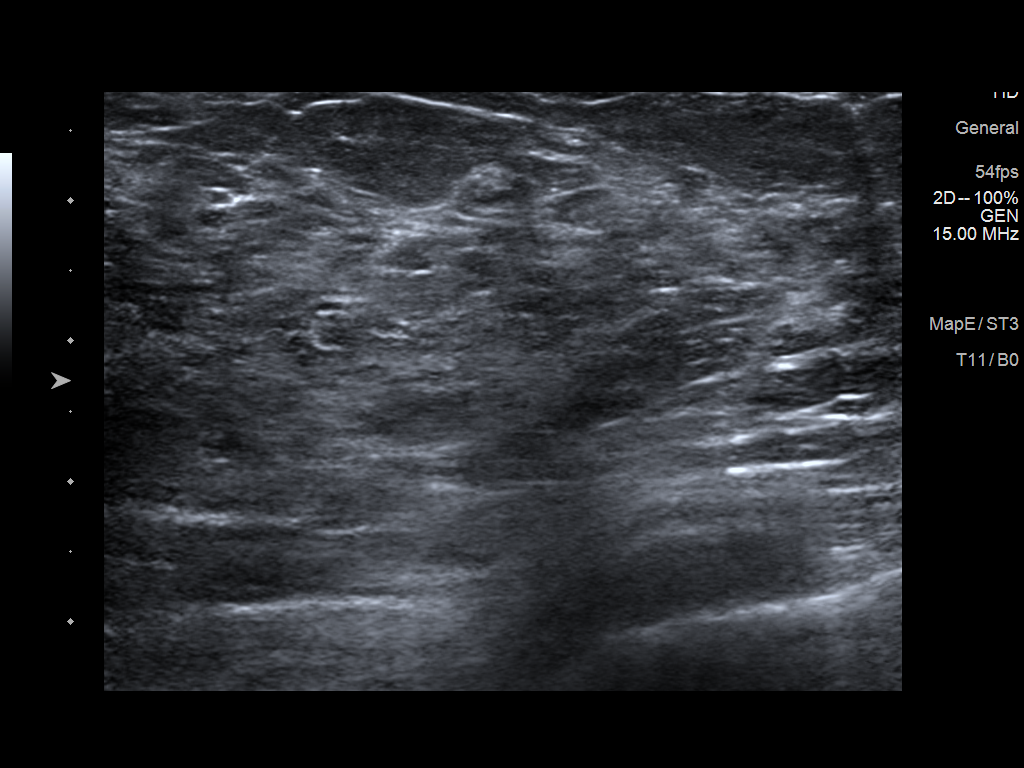

[2 of 2 positions shown; findings below may reference images not displayed]

FINDINGS: On physical exam, there is prominent fibroglandular tissue
throughout the upper outer quadrant of the left breast without a
discrete mass.

Targeted ultrasound is performed, showing heterogeneous
fibroglandular tissue. No mass cyst or suspicious lesion.
IMPRESSION: Normal exam.  No evidence of breast malignancy.

RECOMMENDATION:
Screening mammogram at age 38 unless there are persistent or
intervening clinical concerns. (Code:Z5-J-KV7)

I have discussed the findings and recommendations with the patient.
Results were also provided in writing at the conclusion of the
visit. If applicable, a reminder letter will be sent to the patient
regarding the next appointment.

BI-RADS CATEGORY  1: Negative.

## 2019-05-12 DIAGNOSIS — Z3009 Encounter for other general counseling and advice on contraception: Secondary | ICD-10-CM | POA: Diagnosis not present

## 2019-05-12 DIAGNOSIS — Z1389 Encounter for screening for other disorder: Secondary | ICD-10-CM | POA: Diagnosis not present

## 2019-06-05 ENCOUNTER — Other Ambulatory Visit: Payer: Self-pay

## 2019-06-05 ENCOUNTER — Ambulatory Visit: Payer: BC Managed Care – PPO | Admitting: Primary Care

## 2019-06-05 ENCOUNTER — Encounter: Payer: Self-pay | Admitting: Primary Care

## 2019-06-05 VITALS — BP 122/82 | HR 72 | Temp 97.5°F | Ht <= 58 in | Wt 154.2 lb

## 2019-06-05 DIAGNOSIS — E119 Type 2 diabetes mellitus without complications: Secondary | ICD-10-CM

## 2019-06-05 DIAGNOSIS — I10 Essential (primary) hypertension: Secondary | ICD-10-CM

## 2019-06-05 LAB — MICROALBUMIN / CREATININE URINE RATIO
Creatinine,U: 44.4 mg/dL
Microalb Creat Ratio: 5.4 mg/g (ref 0.0–30.0)
Microalb, Ur: 2.4 mg/dL — ABNORMAL HIGH (ref 0.0–1.9)

## 2019-06-05 LAB — POCT GLYCOSYLATED HEMOGLOBIN (HGB A1C): Hemoglobin A1C: 5.7 % — AB (ref 4.0–5.6)

## 2019-06-05 NOTE — Assessment & Plan Note (Signed)
Doing well on nifedipine XL 30 mg. Continue same as she is currently breastfeeding. BP stable.

## 2019-06-05 NOTE — Assessment & Plan Note (Signed)
A1C today of 5.7 which is an increase from 5.3 in March 2020. Encouraged a healthy diet with regular exercise. Continue metformin ER 500 mg for now.  Foot exam today. She will reschedule her eye exam. Urine microalbumin pending as she is off of ACE due to pregnancy and breastfeeding.  Follow up in 6 months.

## 2019-06-05 NOTE — Patient Instructions (Addendum)
Continue Metformin ER 500 mg tablets daily for diabetes.  Stop by the lab prior to leaving today. I will notify you of your results once received.   Schedule your eye exam as discussed.  Continue nifedipine XL for blood pressure while you are breast feeding.  Please schedule a follow up appointment in 6 months for diabetes check.   It was a pleasure to see you today!

## 2019-06-05 NOTE — Progress Notes (Signed)
Subjective:    Patient ID: Kelly Gregory, female    DOB: 1985-12-20, 33 y.o.   MRN: [id number]  HPI  Kelly Gregory is a 33 year old female who presents today for follow up of diabetes.  Current medications include: Metformin ER 500 mg daily  She is checking her blood glucose _ times daily and is getting readings of  Last A1C: 5.7 today, 5.3 in March 2020 Last Eye Exam: No recent exam due to Covid. Last Foot Exam:  Due  Pneumonia Vaccination: Completed in 2017 ACE/ARB: Urine microalbumin pending Statin: None  Diet currently consists of:  Breakfast: Protein shake Lunch: Chicken with sauce,  Dinner: Lean protein, vegetables, starch, tacos Snacks: None Desserts: Once weekly Beverages: Mostly water, occasional soda  Exercise: She is walking some  BP Readings from Last 3 Encounters:  06/05/19 122/82  03/29/19 (!) 158/98  12/02/18 120/84     Review of Systems  Eyes: Negative for visual disturbance.  Respiratory: Negative for shortness of breath.   Cardiovascular: Negative for chest pain.  Neurological: Negative for dizziness and numbness.       Past Medical History:  Diagnosis Date  . Borderline diabetes   . DM (diabetes mellitus) (HCC)   . Elevated blood pressure    no meds  . Gestational diabetes   . Hypertension   . Rheumatoid arthritis (HCC)    Years ago, does not follow, no meds  . Seasonal allergies   . Syncope    As a child, no problems during adulthood.     Social History   Socioeconomic History  . Marital status: Married    Spouse name: Not on file  . Number of children: Not on file  . Years of education: Not on file  . Highest education level: Not on file  Occupational History  . Not on file  Social Needs  . Financial resource strain: Not hard at all  . Food insecurity    Worry: Never true    Inability: Never true  . Transportation needs    Medical: No    Non-medical: Not on file  Tobacco Use  . Smoking status: Never Smoker  .  Smokeless tobacco: Never Used  Substance and Sexual Activity  . Alcohol use: No  . Drug use: No  . Sexual activity: Yes    Comment: approx [redacted] wks gestation  Lifestyle  . Physical activity    Days per week: Not on file    Minutes per session: Not on file  . Stress: Not at all  Relationships  . Social Musician on phone: Not on file    Gets together: Not on file    Attends religious service: Not on file    Active member of club or organization: Not on file    Attends meetings of clubs or organizations: Not on file    Relationship status: Not on file  . Intimate partner violence    Fear of current or ex partner: No    Emotionally abused: No    Physically abused: No    Forced sexual activity: No  Other Topics Concern  . Not on file  Social History Narrative   Married.   2 children.   Works as a Paramedic.   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    Past Surgical History:  Procedure Laterality Date  . CESAREAN SECTION  04/01/2011   Procedure: CESAREAN SECTION;  Surgeon: Oliver Pila;  Location: [facility];  Service:  Gynecology;  Laterality: N/A;  . CESAREAN SECTION N/A 05/23/2013   Procedure: CESAREAN SECTION;  Surgeon: Cheri Fowler, MD;  Location: [facility];  Service: Obstetrics;  Laterality: N/A;  . CESAREAN SECTION N/A 03/27/2019   Procedure: CESAREAN SECTION;  Surgeon: Paula Compton, MD;  Location: [facility];  Service: Obstetrics;  Laterality: N/A;  [name],  RNFA  . CYSTECTOMY Left    removed from underarm   . DILATION AND EVACUATION N/A 02/01/2018   Procedure: DILATATION AND EVACUATION;  Surgeon: Paula Compton, MD;  Location: [facility];  Service: Gynecology;  Laterality: N/A;  . TOOTH EXTRACTION     wisdom teeth ext    Family History  Adopted: Yes  Problem Relation Age of Onset  . Other Other        Adopted    Allergies  Allergen Reactions  . Azithromycin Nausea And Vomiting  . Gatifloxacin Nausea And Vomiting and Other (See Comments)   . Tequin Nausea And Vomiting  . Penicillins Hives, Rash and Other (See Comments)    Has patient had a PCN reaction causing immediate rash, facial/tongue/throat swelling, SOB or lightheadedness with hypotension: Unknown Has patient had a PCN reaction causing severe rash involving mucus membranes or skin necrosis: Unknown Has patient had a PCN reaction that required hospitalization: Unknown Has patient had a PCN reaction occurring within the last 10 years: No If all of the above answers are "NO", then may proceed with Cephalosporin use.     Current Outpatient Medications on File Prior to Visit  Medication Sig Dispense Refill  . folic acid (FOLVITE) 563 MCG tablet Take 400 mcg by mouth daily.    Marland Kitchen ibuprofen (ADVIL) 800 MG tablet Take 1 tablet (800 mg total) by mouth every 8 (eight) hours. 45 tablet 1  . metFORMIN (GLUCOPHAGE-XR) 500 MG 24 hr tablet Take 1 tablet (500 mg total) by mouth 2 (two) times a day. 180 tablet 3  . NIFEdipine (PROCARDIA-XL/NIFEDICAL-XL) 30 MG 24 hr tablet Take 30 mg by mouth daily.     . Prenatal Vit-Fe Fumarate-FA (PRENATAL MULTIVITAMIN) TABS tablet Take 1 tablet by mouth daily at 12 noon. 100 tablet 3   No current facility-administered medications on file prior to visit.     BP 122/82   Pulse 72   Temp (!) 97.5 F (36.4 C) (Temporal)   Ht 4\' 9"  (1.448 m)   Wt 154 lb 4 oz (70 kg)   LMP 05/30/2018   SpO2 98%   BMI 33.38 kg/m    Objective:   Physical Exam  Constitutional: She appears well-nourished.  Neck: Neck supple.  Cardiovascular: Normal rate and regular rhythm.  Respiratory: Effort normal and breath sounds normal.  Skin: Skin is warm and dry.  Psychiatric: She has a normal mood and affect.           Assessment & Plan:

## 2019-06-20 ENCOUNTER — Other Ambulatory Visit: Payer: Self-pay | Admitting: Primary Care

## 2019-06-20 DIAGNOSIS — I10 Essential (primary) hypertension: Secondary | ICD-10-CM

## 2019-07-14 ENCOUNTER — Encounter: Payer: Self-pay | Admitting: Family Medicine

## 2019-07-14 ENCOUNTER — Ambulatory Visit (INDEPENDENT_AMBULATORY_CARE_PROVIDER_SITE_OTHER): Payer: BC Managed Care – PPO | Admitting: Family Medicine

## 2019-07-14 ENCOUNTER — Other Ambulatory Visit: Payer: Self-pay

## 2019-07-14 VITALS — BP 144/102 | HR 113 | Ht <= 58 in | Wt 156.5 lb

## 2019-07-14 DIAGNOSIS — M069 Rheumatoid arthritis, unspecified: Secondary | ICD-10-CM

## 2019-07-14 MED ORDER — PREDNISONE 10 MG PO TABS: ORAL_TABLET | ORAL | 0 refills | Status: DC

## 2019-07-14 NOTE — Progress Notes (Signed)
She is breastfeeding after a C section 3 months ago.  On metformin at baseline. She has h/o RA, was dx'd as a teenager.  "I woke up one day and couldn't move" and had rheum eval years ago.  She has L hand and R arm pain recently.  She has had worse since delivery, with flares for a few days.  The past week she had sx for 1 week consistently.  She didn't have sx during pregnancy.  R hand with normal ROM but L hand/fingers stiff and painful.    She is safe at home.  No injury, no trauma.  She has been off treatment for years.  No FCNAVD.  No tick bites.    She attributed BP and pulse elevation to pain.  Last week she had R hand pain but this is some better in the meantime.  She had a left over hydrocodone from C section but that didn't help with pain today.    Meds, vitals, and allergies reviewed.   ROS: Per HPI unless specifically indicated in ROS section   GEN: nad, alert and oriented, but in discomfort due to hand pain HEENT: ncat NECK: supple w/o LA CV: rrr.   PULM: ctab, no inc wob ABD: soft, +bs EXT: no edema SKIN: no acute rash, well perfused.  Normal right hand range of motion but left hand has significant pain on range of motion at the fingers. Normal gait, able to bear weight.

## 2019-07-14 NOTE — Patient Instructions (Signed)
Presumed RA flare.  Go to the lab on the way out.  We'll contact you with your lab report. Start prednisone with food, taper over the next few days.  Update [place] Monday.  Stop aleve/ibuprofen.  Take care.  Glad to see you.

## 2019-07-15 LAB — CBC WITH DIFFERENTIAL/PLATELET
Absolute Monocytes: 644 cells/uL (ref 200–950)
Basophils Absolute: 22 cells/uL (ref 0–200)
Basophils Relative: 0.3 %
Eosinophils Absolute: 104 cells/uL (ref 15–500)
Eosinophils Relative: 1.4 %
HCT: 41.4 % (ref 35.0–45.0)
Hemoglobin: 14.1 g/dL (ref 11.7–15.5)
Lymphs Abs: 1954 cells/uL (ref 850–3900)
MCH: 28.4 pg (ref 27.0–33.0)
MCHC: 34.1 g/dL (ref 32.0–36.0)
MCV: 83.3 fL (ref 80.0–100.0)
MPV: 10.1 fL (ref 7.5–12.5)
Monocytes Relative: 8.7 %
Neutro Abs: 4677 cells/uL (ref 1500–7800)
Neutrophils Relative %: 63.2 %
Platelets: 413 10*3/uL — ABNORMAL HIGH (ref 140–400)
RBC: 4.97 10*6/uL (ref 3.80–5.10)
RDW: 13.5 % (ref 11.0–15.0)
Total Lymphocyte: 26.4 %
WBC: 7.4 10*3/uL (ref 3.8–10.8)

## 2019-07-15 LAB — COMPREHENSIVE METABOLIC PANEL
AG Ratio: 1.1 (calc) (ref 1.0–2.5)
ALT: 34 U/L — ABNORMAL HIGH (ref 6–29)
AST: 16 U/L (ref 10–30)
Albumin: 3.7 g/dL (ref 3.6–5.1)
Alkaline phosphatase (APISO): 110 U/L (ref 31–125)
BUN: 16 mg/dL (ref 7–25)
CO2: 25 mmol/L (ref 20–32)
Calcium: 9.3 mg/dL (ref 8.6–10.2)
Chloride: 103 mmol/L (ref 98–110)
Creat: 0.69 mg/dL (ref 0.50–1.10)
Globulin: 3.5 g/dL (calc) (ref 1.9–3.7)
Glucose, Bld: 115 mg/dL — ABNORMAL HIGH (ref 65–99)
Potassium: 4.2 mmol/L (ref 3.5–5.3)
Sodium: 138 mmol/L (ref 135–146)
Total Bilirubin: 0.3 mg/dL (ref 0.2–1.2)
Total Protein: 7.2 g/dL (ref 6.1–8.1)

## 2019-07-15 LAB — RHEUMATOID FACTOR: Rheumatoid fact SerPl-aCnc: 124 IU/mL — ABNORMAL HIGH (ref <14)

## 2019-07-15 LAB — SEDIMENTATION RATE: Sed Rate: 58 mm/h — ABNORMAL HIGH (ref 0–20)

## 2019-07-15 LAB — TSH: TSH: 2.51 mIU/L

## 2019-07-17 DIAGNOSIS — M069 Rheumatoid arthritis, unspecified: Secondary | ICD-10-CM | POA: Insufficient documentation

## 2019-07-17 NOTE — Assessment & Plan Note (Signed)
Based on her history, it sounds like she has a rheumatoid arthritis flare.  We talked about the fact that her symptoms were clearly better/quiesced sent during pregnancy with flare of symptoms afterwards.  She is safe at home and does not have any other obvious trigger.  Discussed options.  Discussed breast-feeding.  At this point, the best option I can think of for both pain control and her postpartum situation would be a short round of prednisone with routine steroid cautions.  We specifically talked about breast-feeding considerations and options with that.  Reasonable to check routine labs today.  See notes on labs.  Refer to rheumatology.  She agrees with plan.  Routed to PCP as FYI.

## 2019-07-31 ENCOUNTER — Encounter: Payer: Self-pay | Admitting: Internal Medicine

## 2019-07-31 DIAGNOSIS — R5383 Other fatigue: Secondary | ICD-10-CM | POA: Diagnosis not present

## 2019-07-31 DIAGNOSIS — M7989 Other specified soft tissue disorders: Secondary | ICD-10-CM | POA: Diagnosis not present

## 2019-07-31 DIAGNOSIS — M0579 Rheumatoid arthritis with rheumatoid factor of multiple sites without organ or systems involvement: Secondary | ICD-10-CM | POA: Diagnosis not present

## 2019-08-21 ENCOUNTER — Observation Stay (HOSPITAL_COMMUNITY): Payer: BC Managed Care – PPO | Admitting: Anesthesiology

## 2019-08-21 ENCOUNTER — Emergency Department (HOSPITAL_COMMUNITY): Payer: BC Managed Care – PPO

## 2019-08-21 ENCOUNTER — Other Ambulatory Visit: Payer: Self-pay | Admitting: Primary Care

## 2019-08-21 ENCOUNTER — Encounter (HOSPITAL_COMMUNITY): Payer: Self-pay | Admitting: Emergency Medicine

## 2019-08-21 ENCOUNTER — Observation Stay (HOSPITAL_COMMUNITY)
Admission: EM | Admit: 2019-08-21 | Discharge: 2019-08-21 | Disposition: A | Discharge status: Home / Self Care | Payer: BC Managed Care – PPO | Attending: Surgery | Admitting: Surgery

## 2019-08-21 ENCOUNTER — Encounter (HOSPITAL_COMMUNITY)
Admission: EM | Disposition: A | Discharge status: Home / Self Care | Payer: Self-pay | Source: Home / Self Care | Attending: Emergency Medicine

## 2019-08-21 ENCOUNTER — Observation Stay (HOSPITAL_COMMUNITY): Payer: BC Managed Care – PPO

## 2019-08-21 ENCOUNTER — Other Ambulatory Visit: Payer: Self-pay

## 2019-08-21 DIAGNOSIS — R1011 Right upper quadrant pain: Secondary | ICD-10-CM

## 2019-08-21 DIAGNOSIS — Z881 Allergy status to other antibiotic agents status: Secondary | ICD-10-CM | POA: Insufficient documentation

## 2019-08-21 DIAGNOSIS — K8012 Calculus of gallbladder with acute and chronic cholecystitis without obstruction: Principal | ICD-10-CM | POA: Insufficient documentation

## 2019-08-21 DIAGNOSIS — E119 Type 2 diabetes mellitus without complications: Secondary | ICD-10-CM | POA: Diagnosis not present

## 2019-08-21 DIAGNOSIS — K8010 Calculus of gallbladder with chronic cholecystitis without obstruction: Secondary | ICD-10-CM | POA: Diagnosis not present

## 2019-08-21 DIAGNOSIS — K8000 Calculus of gallbladder with acute cholecystitis without obstruction: Secondary | ICD-10-CM | POA: Diagnosis not present

## 2019-08-21 DIAGNOSIS — K819 Cholecystitis, unspecified: Secondary | ICD-10-CM | POA: Diagnosis not present

## 2019-08-21 DIAGNOSIS — Z880 Allergy status to penicillin: Secondary | ICD-10-CM | POA: Diagnosis not present

## 2019-08-21 DIAGNOSIS — Z7984 Long term (current) use of oral hypoglycemic drugs: Secondary | ICD-10-CM | POA: Diagnosis not present

## 2019-08-21 DIAGNOSIS — M069 Rheumatoid arthritis, unspecified: Secondary | ICD-10-CM | POA: Insufficient documentation

## 2019-08-21 DIAGNOSIS — Z20828 Contact with and (suspected) exposure to other viral communicable diseases: Secondary | ICD-10-CM | POA: Insufficient documentation

## 2019-08-21 DIAGNOSIS — K8020 Calculus of gallbladder without cholecystitis without obstruction: Secondary | ICD-10-CM | POA: Diagnosis not present

## 2019-08-21 DIAGNOSIS — E785 Hyperlipidemia, unspecified: Secondary | ICD-10-CM | POA: Insufficient documentation

## 2019-08-21 DIAGNOSIS — I10 Essential (primary) hypertension: Secondary | ICD-10-CM | POA: Diagnosis not present

## 2019-08-21 DIAGNOSIS — R000 Tachycardia, unspecified: Secondary | ICD-10-CM | POA: Diagnosis not present

## 2019-08-21 DIAGNOSIS — R079 Chest pain, unspecified: Secondary | ICD-10-CM | POA: Diagnosis not present

## 2019-08-21 DIAGNOSIS — Z419 Encounter for procedure for purposes other than remedying health state, unspecified: Secondary | ICD-10-CM

## 2019-08-21 HISTORY — DX: Calculus of gallbladder with acute cholecystitis without obstruction: K80.00

## 2019-08-21 HISTORY — PX: CHOLECYSTECTOMY: SHX55

## 2019-08-21 LAB — BASIC METABOLIC PANEL
Anion gap: 10 (ref 5–15)
BUN: 12 mg/dL (ref 6–20)
CO2: 24 mmol/L (ref 22–32)
Calcium: 8.5 mg/dL — ABNORMAL LOW (ref 8.9–10.3)
Chloride: 104 mmol/L (ref 98–111)
Creatinine, Ser: 0.61 mg/dL (ref 0.44–1.00)
GFR calc Af Amer: >60 mL/min (ref >60)
GFR calc non Af Amer: >60 mL/min (ref >60)
Glucose, Bld: 120 mg/dL — ABNORMAL HIGH (ref 70–99)
Potassium: 3.6 mmol/L (ref 3.5–5.1)
Sodium: 138 mmol/L (ref 135–145)

## 2019-08-21 LAB — CBC
HCT: 44.6 % (ref 36.0–46.0)
Hemoglobin: 14.8 g/dL (ref 12.0–15.0)
MCH: 28.5 pg (ref 26.0–34.0)
MCHC: 33.2 g/dL (ref 30.0–36.0)
MCV: 85.8 fL (ref 80.0–100.0)
Platelets: 349 10*3/uL (ref 150–400)
RBC: 5.2 MIL/uL — ABNORMAL HIGH (ref 3.87–5.11)
RDW: 13.1 % (ref 11.5–15.5)
WBC: 7.6 10*3/uL (ref 4.0–10.5)
nRBC: 0 % (ref 0.0–0.2)

## 2019-08-21 LAB — HEPATIC FUNCTION PANEL
ALT: 199 U/L — ABNORMAL HIGH (ref 0–44)
AST: 293 U/L — ABNORMAL HIGH (ref 15–41)
Albumin: 3.4 g/dL — ABNORMAL LOW (ref 3.5–5.0)
Alkaline Phosphatase: 137 U/L — ABNORMAL HIGH (ref 38–126)
Bilirubin, Direct: 0.5 mg/dL — ABNORMAL HIGH (ref 0.0–0.2)
Indirect Bilirubin: 0.5 mg/dL (ref 0.3–0.9)
Total Bilirubin: 1 mg/dL (ref 0.3–1.2)
Total Protein: 8.5 g/dL — ABNORMAL HIGH (ref 6.5–8.1)

## 2019-08-21 LAB — LIPASE, BLOOD: Lipase: 50 U/L (ref 11–51)

## 2019-08-21 LAB — TROPONIN I (HIGH SENSITIVITY)
Troponin I (High Sensitivity): 3 ng/L (ref <18)
Troponin I (High Sensitivity): 2 ng/L (ref <18)

## 2019-08-21 LAB — SARS CORONAVIRUS 2 BY RT PCR (HOSPITAL ORDER, PERFORMED IN ~~LOC~~ HOSPITAL LAB): SARS Coronavirus 2: NEGATIVE

## 2019-08-21 LAB — I-STAT BETA HCG BLOOD, ED (MC, WL, AP ONLY): I-stat hCG, quantitative: <5 m[IU]/mL (ref <5)

## 2019-08-21 LAB — GLUCOSE, CAPILLARY: Glucose-Capillary: 145 mg/dL — ABNORMAL HIGH (ref 70–99)

## 2019-08-21 SURGERY — LAPAROSCOPIC CHOLECYSTECTOMY
Anesthesia: General | Site: Abdomen

## 2019-08-21 MED ORDER — DEXAMETHASONE SODIUM PHOSPHATE 10 MG/ML IJ SOLN
INTRAMUSCULAR | Status: AC
  Filled 2019-08-21: qty 1

## 2019-08-21 MED ORDER — ACETAMINOPHEN 10 MG/ML IV SOLN
INTRAVENOUS | Status: AC
  Filled 2019-08-21: qty 100

## 2019-08-21 MED ORDER — SODIUM CHLORIDE 0.9 % IV SOLN: 2.0000 g | Freq: Once | INTRAVENOUS | Status: DC

## 2019-08-21 MED ORDER — KETOROLAC TROMETHAMINE 30 MG/ML IJ SOLN
INTRAMUSCULAR | Status: AC
  Filled 2019-08-21: qty 1

## 2019-08-21 MED ORDER — ONDANSETRON HCL 4 MG/2ML IJ SOLN
INTRAMUSCULAR | Status: DC | PRN
  Administered 2019-08-21: 4 mg via INTRAVENOUS

## 2019-08-21 MED ORDER — ROCURONIUM BROMIDE 10 MG/ML (PF) SYRINGE
PREFILLED_SYRINGE | INTRAVENOUS | Status: AC
  Filled 2019-08-21: qty 10

## 2019-08-21 MED ORDER — ACETAMINOPHEN 10 MG/ML IV SOLN
INTRAVENOUS | Status: DC | PRN
  Administered 2019-08-21: 1000 mg via INTRAVENOUS

## 2019-08-21 MED ORDER — BUPIVACAINE-EPINEPHRINE (PF) 0.5% -1:200000 IJ SOLN
INTRAMUSCULAR | Status: AC
  Filled 2019-08-21: qty 30

## 2019-08-21 MED ORDER — PROPOFOL 10 MG/ML IV BOLUS
INTRAVENOUS | Status: AC
  Filled 2019-08-21: qty 20

## 2019-08-21 MED ORDER — KETOROLAC TROMETHAMINE 30 MG/ML IJ SOLN
INTRAMUSCULAR | Status: DC | PRN
  Administered 2019-08-21: 30 mg via INTRAVENOUS

## 2019-08-21 MED ORDER — LACTATED RINGERS IV SOLN: INTRAVENOUS | Status: DC

## 2019-08-21 MED ORDER — ONDANSETRON 4 MG PO TBDP: 4.0000 mg | ORAL_TABLET | Freq: Four times a day (QID) | ORAL | Status: DC | PRN

## 2019-08-21 MED ORDER — BUPIVACAINE-EPINEPHRINE (PF) 0.5% -1:200000 IJ SOLN
INTRAMUSCULAR | Status: DC | PRN
  Administered 2019-08-21: 30 mL

## 2019-08-21 MED ORDER — HYDROMORPHONE HCL 1 MG/ML IJ SOLN: 0.5000 mg | INTRAMUSCULAR | Status: DC | PRN

## 2019-08-21 MED ORDER — ONDANSETRON HCL 4 MG/2ML IJ SOLN
INTRAMUSCULAR | Status: AC
  Filled 2019-08-21: qty 2

## 2019-08-21 MED ORDER — FENTANYL CITRATE (PF) 250 MCG/5ML IJ SOLN
INTRAMUSCULAR | Status: DC | PRN
  Administered 2019-08-21: 150 ug via INTRAVENOUS
  Administered 2019-08-21 (×2): 50 ug via INTRAVENOUS

## 2019-08-21 MED ORDER — SODIUM CHLORIDE 0.9% FLUSH: 3.0000 mL | Freq: Once | INTRAVENOUS | Status: DC

## 2019-08-21 MED ORDER — DIPHENHYDRAMINE HCL 50 MG/ML IJ SOLN: 25.0000 mg | Freq: Four times a day (QID) | INTRAMUSCULAR | Status: DC | PRN

## 2019-08-21 MED ORDER — ACETAMINOPHEN 500 MG PO TABS: 1000.0000 mg | ORAL_TABLET | Freq: Once | ORAL | Status: DC

## 2019-08-21 MED ORDER — SODIUM CHLORIDE 0.9 % IV SOLN
INTRAVENOUS | Status: DC | PRN
  Administered 2019-08-21: 12:00:00 via INTRAVENOUS

## 2019-08-21 MED ORDER — SODIUM CHLORIDE 0.9 % IV SOLN: 2.0000 g | INTRAVENOUS | Status: DC

## 2019-08-21 MED ORDER — OXYCODONE HCL 5 MG PO TABS: 5.0000 mg | ORAL_TABLET | Freq: Four times a day (QID) | ORAL | 0 refills | Status: DC | PRN

## 2019-08-21 MED ORDER — MIDAZOLAM HCL 2 MG/2ML IJ SOLN
INTRAMUSCULAR | Status: AC
  Filled 2019-08-21: qty 2

## 2019-08-21 MED ORDER — SODIUM CHLORIDE 0.9 % IR SOLN
Status: DC | PRN
  Administered 2019-08-21: 1000 mL

## 2019-08-21 MED ORDER — LIDOCAINE 2% (20 MG/ML) 5 ML SYRINGE
INTRAMUSCULAR | Status: AC
  Filled 2019-08-21: qty 5

## 2019-08-21 MED ORDER — BUPIVACAINE HCL (PF) 0.25 % IJ SOLN
INTRAMUSCULAR | Status: AC
  Filled 2019-08-21: qty 30

## 2019-08-21 MED ORDER — SODIUM CHLORIDE 0.9 % IV SOLN
INTRAVENOUS | Status: DC | PRN
  Administered 2019-08-21: 14 mL

## 2019-08-21 MED ORDER — SUGAMMADEX SODIUM 200 MG/2ML IV SOLN
INTRAVENOUS | Status: DC | PRN
  Administered 2019-08-21: 200 mg via INTRAVENOUS
  Administered 2019-08-21: 100 mg via INTRAVENOUS

## 2019-08-21 MED ORDER — ROCURONIUM BROMIDE 10 MG/ML (PF) SYRINGE
PREFILLED_SYRINGE | INTRAVENOUS | Status: DC | PRN
  Administered 2019-08-21: 70 mg via INTRAVENOUS

## 2019-08-21 MED ORDER — DEXAMETHASONE SODIUM PHOSPHATE 10 MG/ML IJ SOLN
INTRAMUSCULAR | Status: DC | PRN
  Administered 2019-08-21: 4 mg via INTRAVENOUS

## 2019-08-21 MED ORDER — 0.9 % SODIUM CHLORIDE (POUR BTL) OPTIME
TOPICAL | Status: DC | PRN
  Administered 2019-08-21: 1000 mL

## 2019-08-21 MED ORDER — FENTANYL CITRATE (PF) 250 MCG/5ML IJ SOLN
INTRAMUSCULAR | Status: AC
  Filled 2019-08-21: qty 5

## 2019-08-21 MED ORDER — PROPOFOL 10 MG/ML IV BOLUS
INTRAVENOUS | Status: DC | PRN
  Administered 2019-08-21: 130 mg via INTRAVENOUS

## 2019-08-21 MED ORDER — FENTANYL CITRATE (PF) 100 MCG/2ML IJ SOLN: 25.0000 ug | INTRAMUSCULAR | Status: DC | PRN

## 2019-08-21 MED ORDER — SODIUM CHLORIDE 0.9 % IV SOLN
2.0000 g | INTRAVENOUS | Status: DC
  Administered 2019-08-21: 2 g via INTRAVENOUS
  Filled 2019-08-21: qty 20

## 2019-08-21 MED ORDER — LIDOCAINE 2% (20 MG/ML) 5 ML SYRINGE
INTRAMUSCULAR | Status: DC | PRN
  Administered 2019-08-21: 100 mg via INTRAVENOUS

## 2019-08-21 MED ORDER — ONDANSETRON HCL 4 MG/2ML IJ SOLN
4.0000 mg | Freq: Once | INTRAMUSCULAR | Status: AC
  Administered 2019-08-21: 4 mg via INTRAVENOUS
  Filled 2019-08-21: qty 2

## 2019-08-21 MED ORDER — MIDAZOLAM HCL 5 MG/5ML IJ SOLN
INTRAMUSCULAR | Status: DC | PRN
  Administered 2019-08-21 (×2): 1 mg via INTRAVENOUS

## 2019-08-21 MED ORDER — ONDANSETRON HCL 4 MG/2ML IJ SOLN: 4.0000 mg | Freq: Four times a day (QID) | INTRAMUSCULAR | Status: DC | PRN

## 2019-08-21 MED ORDER — DIPHENHYDRAMINE HCL 25 MG PO CAPS: 25.0000 mg | ORAL_CAPSULE | Freq: Four times a day (QID) | ORAL | Status: DC | PRN

## 2019-08-21 MED ORDER — HYDROMORPHONE HCL 1 MG/ML IJ SOLN
0.5000 mg | Freq: Once | INTRAMUSCULAR | Status: AC
  Administered 2019-08-21: 0.5 mg via INTRAVENOUS
  Filled 2019-08-21: qty 1

## 2019-08-21 SURGICAL SUPPLY — 43 items
APPLIER CLIP ROT 10 11.4 M/L (STAPLE) ×3
BENZOIN TINCTURE PRP APPL 2/3 (GAUZE/BANDAGES/DRESSINGS) ×3 IMPLANT
BLADE CLIPPER SURG (BLADE) IMPLANT
CANISTER SUCT 3000ML PPV (MISCELLANEOUS) ×3 IMPLANT
CHLORAPREP W/TINT 26 (MISCELLANEOUS) ×3 IMPLANT
CLIP APPLIE ROT 10 11.4 M/L (STAPLE) ×1 IMPLANT
CLOSURE STERI-STRIP 1/2X4 (GAUZE/BANDAGES/DRESSINGS) ×1
CLOSURE WOUND 1/2 X4 (GAUZE/BANDAGES/DRESSINGS) ×1
CLSR STERI-STRIP ANTIMIC 1/2X4 (GAUZE/BANDAGES/DRESSINGS) ×2 IMPLANT
COVER SURGICAL LIGHT HANDLE (MISCELLANEOUS) ×3 IMPLANT
COVER WAND RF STERILE (DRAPES) ×3 IMPLANT
DRSG TEGADERM 2-3/8X2-3/4 SM (GAUZE/BANDAGES/DRESSINGS) ×9 IMPLANT
DRSG TEGADERM 4X4.75 (GAUZE/BANDAGES/DRESSINGS) ×3 IMPLANT
ELECT REM PT RETURN 9FT ADLT (ELECTROSURGICAL) ×3
ELECTRODE REM PT RTRN 9FT ADLT (ELECTROSURGICAL) ×1 IMPLANT
GAUZE SPONGE 2X2 8PLY STRL LF (GAUZE/BANDAGES/DRESSINGS) ×1 IMPLANT
GLOVE BIO SURGEON STRL SZ7 (GLOVE) ×3 IMPLANT
GLOVE BIOGEL PI IND STRL 7.5 (GLOVE) ×1 IMPLANT
GLOVE BIOGEL PI INDICATOR 7.5 (GLOVE) ×2
GOWN STRL REUS W/ TWL LRG LVL3 (GOWN DISPOSABLE) ×3 IMPLANT
GOWN STRL REUS W/TWL LRG LVL3 (GOWN DISPOSABLE) ×6
KIT BASIN OR (CUSTOM PROCEDURE TRAY) ×3 IMPLANT
KIT TURNOVER KIT B (KITS) ×3 IMPLANT
NS IRRIG 1000ML POUR BTL (IV SOLUTION) ×3 IMPLANT
PAD ARMBOARD 7.5X6 YLW CONV (MISCELLANEOUS) ×3 IMPLANT
POUCH RETRIEVAL ECOSAC 10 (ENDOMECHANICALS) ×1 IMPLANT
POUCH RETRIEVAL ECOSAC 10MM (ENDOMECHANICALS) ×2
POUCH SPECIMEN RETRIEVAL 10MM (ENDOMECHANICALS) ×3 IMPLANT
SCISSORS LAP 5X35 DISP (ENDOMECHANICALS) ×3 IMPLANT
SET IRRIG TUBING LAPAROSCOPIC (IRRIGATION / IRRIGATOR) ×3 IMPLANT
SET TUBE SMOKE EVAC HIGH FLOW (TUBING) ×3 IMPLANT
SLEEVE ENDOPATH XCEL 5M (ENDOMECHANICALS) ×3 IMPLANT
SPECIMEN JAR SMALL (MISCELLANEOUS) ×3 IMPLANT
SPONGE GAUZE 2X2 STER 10/PKG (GAUZE/BANDAGES/DRESSINGS) ×2
STRIP CLOSURE SKIN 1/2X4 (GAUZE/BANDAGES/DRESSINGS) ×2 IMPLANT
SUT MNCRL AB 4-0 PS2 18 (SUTURE) ×3 IMPLANT
TOWEL GREEN STERILE (TOWEL DISPOSABLE) ×3 IMPLANT
TOWEL GREEN STERILE FF (TOWEL DISPOSABLE) ×3 IMPLANT
TRAY LAPAROSCOPIC MC (CUSTOM PROCEDURE TRAY) ×3 IMPLANT
TROCAR XCEL BLUNT TIP 100MML (ENDOMECHANICALS) ×3 IMPLANT
TROCAR XCEL NON-BLD 11X100MML (ENDOMECHANICALS) ×3 IMPLANT
TROCAR XCEL NON-BLD 5MMX100MML (ENDOMECHANICALS) ×3 IMPLANT
WATER STERILE IRR 1000ML POUR (IV SOLUTION) ×3 IMPLANT

## 2019-08-21 NOTE — ED Notes (Signed)
Pt states nausea has decreased. Pain 6/10

## 2019-08-21 NOTE — ED Notes (Signed)
Patient transported to Ultrasound 

## 2019-08-21 NOTE — Discharge Instructions (Signed)
General Anesthesia, Adult, Care After °This sheet gives you information about how to care for yourself after your procedure. Your health care provider may also give you more specific instructions. If you have problems or questions, contact your health care provider. °What can I expect after the procedure? °After the procedure, the following side effects are common: °· Pain or discomfort at the IV site. °· Nausea. °· Vomiting. °· Sore throat. °· Trouble concentrating. °· Feeling cold or chills. °· Weak or tired. °· Sleepiness and fatigue. °· Soreness and body aches. These side effects can affect parts of the body that were not involved in surgery. °Follow these instructions at home: ° °For at least 24 hours after the procedure: °· Have a responsible adult stay with you. It is important to have someone help care for you until you are awake and alert. °· Rest as needed. °· Do not: °? Participate in activities in which you could fall or become injured. °? Drive. °? Use heavy machinery. °? Drink alcohol. °? Take sleeping pills or medicines that cause drowsiness. °? Make important decisions or sign legal documents. °? Take care of children on your own. °Eating and drinking °· Follow any instructions from your health care provider about eating or drinking restrictions. °· When you feel hungry, start by eating small amounts of foods that are soft and easy to digest (bland), such as toast. Gradually return to your regular diet. °· Drink enough fluid to keep your urine pale yellow. °· If you vomit, rehydrate by drinking water, juice, or clear broth. °General instructions °· If you have sleep apnea, surgery and certain medicines can increase your risk for breathing problems. Follow instructions from your health care provider about wearing your sleep device: °? Anytime you are sleeping, including during daytime naps. °? While taking prescription pain medicines, sleeping medicines, or medicines that make you drowsy. °· Return to  your normal activities as told by your health care provider. Ask your health care provider what activities are safe for you. °· Take over-the-counter and prescription medicines only as told by your health care provider. °· If you smoke, do not smoke without supervision. °· Keep all follow-up visits as told by your health care provider. This is important. °Contact a health care provider if: °· You have nausea or vomiting that does not get better with medicine. °· You cannot eat or drink without vomiting. °· You have pain that does not get better with medicine. °· You are unable to pass urine. °· You develop a skin rash. °· You have a fever. °· You have redness around your IV site that gets worse. °Get help right away if: °· You have difficulty breathing. °· You have chest pain. °· You have blood in your urine or stool, or you vomit blood. °Summary °· After the procedure, it is common to have a sore throat or nausea. It is also common to feel tired. °· Have a responsible adult stay with you for the first 24 hours after general anesthesia. It is important to have someone help care for you until you are awake and alert. °· When you feel hungry, start by eating small amounts of foods that are soft and easy to digest (bland), such as toast. Gradually return to your regular diet. °· Drink enough fluid to keep your urine pale yellow. °· Return to your normal activities as told by your health care provider. Ask your health care provider what activities are safe for you. °This information is not   intended to replace advice given to you by your health care provider. Make sure you discuss any questions you have with your health care provider. Document Released: 12/07/2000 Document Revised: 09/03/2017 Document Reviewed: 04/16/2017 Elsevier Patient Education  2020 [place]: POST OP INSTRUCTIONS  ######################################################################  EAT Gradually transition to a  high fiber diet with a fiber supplement over the next few weeks after discharge.  Start with a pureed / full liquid diet (see below)  WALK Walk an hour a day.  Control your pain to do that.    CONTROL PAIN Control pain so that you can walk, sleep, tolerate sneezing/coughing, go up/down stairs.  HAVE A BOWEL MOVEMENT DAILY Keep your bowels regular to avoid problems.  OK to try a laxative to override constipation.  OK to use an antidairrheal to slow down diarrhea.  Call if not better after 2 tries  CALL IF YOU HAVE PROBLEMS/CONCERNS Call if you are still struggling despite following these instructions. Call if you have concerns not answered by these instructions  ######################################################################    1. DIET: Follow a light bland diet & liquids the first 24 hours after arrival home, such as soup, liquids, starches, etc.  Be sure to drink plenty of fluids.  Quickly advance to a usual solid diet within a few days.  Avoid fast food or heavy meals as your are more likely to get nauseated or have irregular bowels.  A low-fat, high-fiber diet for the rest of your life is ideal.  2. Take your usually prescribed home medications unless otherwise directed.  3. PAIN CONTROL: a. Pain is best controlled by a usual combination of three different methods TOGETHER: i. Ice/Heat ii. Over the counter pain medication iii. Prescription pain medication b. Most patients will experience some swelling and bruising around the incisions.  Ice packs or heating pads (30-60 minutes up to 6 times a day) will help. Use ice for the first few days to help decrease swelling and bruising, then switch to heat to help relax tight/sore spots and speed recovery.  Some people prefer to use ice alone, heat alone, alternating between ice & heat.  Experiment to what works for you.  Swelling and bruising can take several weeks to resolve.   c. It is helpful to take an over-the-counter pain  medication regularly for the first few weeks.  Choose one of the following that works best for you: i. Naproxen (Aleve, etc)  Two 220mg  tabs twice a day ii. Ibuprofen (Advil, etc) Three 200mg  tabs four times a day (every meal & bedtime) iii. Acetaminophen (Tylenol, etc) 500-650mg  four times a day (every meal & bedtime) d. A  prescription for pain medication (such as oxycodone, hydrocodone, tramadol, gabapentin, methocarbamol, etc) should be given to you upon discharge.  Take your pain medication as prescribed.  i. If you are having problems/concerns with the prescription medicine (does not control pain, nausea, vomiting, rash, itching, etc), please call us [phone] to see if we need to switch you to a different pain medicine that will work better for you and/or control your side effect better. ii. If you need a refill on your pain medication, please give [place] 48 hour notice.  contact your pharmacy.  They will contact our office to request authorization. Prescriptions will not be filled after 5 pm or on week-ends  4. Avoid getting constipated.   a. Between the surgery and the pain medications, it is common to experience some constipation.   b. Increasing fluid intake and  taking a fiber supplement (such as Metamucil, Citrucel, FiberCon, MiraLax, etc) 1-2 times a day regularly will usually help prevent this problem from occurring.   c. A mild laxative (prune juice, Milk of Magnesia, MiraLax, etc) should be taken according to package directions if there are no bowel movements after 48 hours.   5. Watch out for diarrhea.   a. If you have many loose bowel movements, simplify your diet to bland foods & liquids for a few days.   b. Stop any stool softeners and decrease your fiber supplement.   c. Switching to mild anti-diarrheal medications (Kayopectate, Pepto Bismol) can help.   d. If this worsens or does not improve, please call us.  6. Wash / shower every day.  You may shower over the dressings as  they are waterproof.  Continue to shower over incision(s) after the dressing is off.  7. Remove your waterproof bandages 5 days after surgery.  You may leave the incision open to air.  You may replace a dressing/Band-Aid to cover the incision for comfort if you wish.   8. ACTIVITIES as tolerated:   a. You may resume regular (light) daily activities beginning the next day--such as daily self-care, walking, climbing stairs--gradually increasing activities as tolerated.  If you can walk 30 minutes without difficulty, it is safe to try more intense activity such as jogging, treadmill, bicycling, low-impact aerobics, swimming, etc. b. Save the most intensive and strenuous activity for last such as sit-ups, heavy lifting, contact sports, etc  Refrain from any heavy lifting or straining until you are off narcotics for pain control.   c. DO NOT PUSH THROUGH PAIN.  Let pain be your guide: If it hurts to do something, don't do it.  Pain is your body warning you to avoid that activity for another week until the pain goes down. d. You may drive when you are no longer taking prescription pain medication, you can comfortably wear a seatbelt, and you can safely maneuver your car and apply brakes. e. Bonita Quin may have sexual intercourse when it is comfortable.  9. FOLLOW UP in our office a. Please call [facility] at [phone] to set up an appointment to see your surgeon in the office for a follow-up appointment approximately 2-3 weeks after your surgery. b. Make sure that you call for this appointment the day you arrive home to insure a convenient appointment time.  10. IF YOU HAVE DISABILITY OR FAMILY LEAVE FORMS, BRING THEM TO THE OFFICE FOR PROCESSING.  DO NOT GIVE THEM TO YOUR DOCTOR.   WHEN TO CALL us [phone]: 1. Poor pain control 2. Reactions / problems with new medications (rash/itching, nausea, etc)  3. Fever over 101.5 F (38.5 C) 4. Inability to urinate 5. Nausea and/or vomiting 6. Worsening  swelling or bruising 7. Continued bleeding from incision. 8. Increased pain, redness, or drainage from the incision   The clinic staff is available to answer your questions during regular business hours (8:30am-5pm).  Please dont hesitate to call and ask to speak to one of our nurses for clinical concerns.   If you have a medical emergency, go to the nearest emergency room or call 911.  A surgeon from [facility] is always on call at the [facility], [place] [address], [place], [place]  [address] ? MAIN: [phone] ? TOLL FREE: [phone] ?  FAX [phone] [email or web address]

## 2019-08-21 NOTE — Anesthesia Postprocedure Evaluation (Signed)
Anesthesia Post Note  Patient: Kelly Gregory  Procedure(s) Performed: LAPAROSCOPIC CHOLECYSTECTOMY with IOC (N/A Abdomen)     Patient location during evaluation: PACU Anesthesia Type: General Level of consciousness: awake and alert Pain management: pain level controlled Vital Signs Assessment: post-procedure vital signs reviewed and stable Respiratory status: spontaneous breathing, nonlabored ventilation, respiratory function stable and patient connected to nasal cannula oxygen Cardiovascular status: blood pressure returned to baseline and stable Postop Assessment: no apparent nausea or vomiting Anesthetic complications: no    Last Vitals:  Vitals:   08/21/19 1400 08/21/19 1415  BP: 122/81 (!) 113/96  Pulse: 63 63  Resp: 15 13  Temp:  36.9 C  SpO2: 96% 95%    Last Pain:  Vitals:   08/21/19 1415  TempSrc:   PainSc: 0-No pain                 Hubert Raatz COKER

## 2019-08-21 NOTE — Progress Notes (Signed)
Patient able to void, ambulate through PACU and required no pain medication. Patient requesting to go home. Patient discharged to home due to meeting discharge criteria.

## 2019-08-21 NOTE — ED Notes (Signed)
Pt ambulatory to restroom with steady gait. Breast pump provided for pt use.

## 2019-08-21 NOTE — Transfer of Care (Signed)
Immediate Anesthesia Transfer of Care Note  Patient: Kelly Gregory  Procedure(s) Performed: LAPAROSCOPIC CHOLECYSTECTOMY with IOC (N/A Abdomen)  Patient Location: PACU  Anesthesia Type:General  Level of Consciousness: awake, alert  and oriented  Airway & Oxygen Therapy: Patient Spontanous Breathing and Patient connected to nasal cannula oxygen  Post-op Assessment: Report given to RN, Post -op Vital signs reviewed and stable and Patient moving all extremities X 4  Post vital signs: Reviewed and stable  Last Vitals:  Vitals Value Taken Time  BP 147/92 08/21/19 1315  Temp    Pulse 64 08/21/19 1321  Resp 11 08/21/19 1325  SpO2 100 % 08/21/19 1321  Vitals shown include unvalidated device data.  Last Pain:  Vitals:   08/21/19 1315  TempSrc:   PainSc: 0-No pain         Complications: No apparent anesthesia complications

## 2019-08-21 NOTE — ED Notes (Signed)
Surgery at bedside.

## 2019-08-21 NOTE — Op Note (Signed)
Laparoscopic Cholecystectomy with IOC Procedure Note  Indications: This patient presents with symptomatic gallbladder disease and will undergo laparoscopic cholecystectomy.  Pre-operative Diagnosis: Calculus of gallbladder with acute cholecystitis, without mention of obstruction  Post-operative Diagnosis: Same  Surgeon: Maia Petties   Assistants: Margie Billet, PA-C  Anesthesia: General endotracheal anesthesia  ASA Class: 1  Procedure Details  The patient was seen again in the Holding Room. The risks, benefits, complications, treatment options, and expected outcomes were discussed with the patient. The possibilities of reaction to medication, pulmonary aspiration, perforation of viscus, bleeding, recurrent infection, finding a normal gallbladder, the need for additional procedures, failure to diagnose a condition, the possible need to convert to an open procedure, and creating a complication requiring transfusion or operation were discussed with the patient. The likelihood of improving the patient's symptoms with return to their baseline status is good.  The patient and/or family concurred with the proposed plan, giving informed consent. The site of surgery properly noted. The patient was taken to Operating Room, identified as Kelly Gregory and the procedure verified as Laparoscopic Cholecystectomy with Intraoperative Cholangiogram. A Time Out was held and the above information confirmed.  Prior to the induction of general anesthesia, antibiotic prophylaxis was administered. General endotracheal anesthesia was then administered and tolerated well. After the induction, the abdomen was prepped with Chloraprep and draped in the sterile fashion. The patient was positioned in the supine position.  Local anesthetic agent was injected into the skin below the umbilicus and an incision made. We dissected down to the abdominal fascia with blunt dissection.  The fascia was incised vertically and we  entered the peritoneal cavity bluntly.  A pursestring suture of 0-Vicryl was placed around the fascial opening.  The Hasson cannula was inserted and secured with the stay suture.  Pneumoperitoneum was then created with CO2 and tolerated well without any adverse changes in the patient's vital signs. An 11-mm port was placed in the subxiphoid position.  Two 5-mm ports were placed in the right upper quadrant. All skin incisions were infiltrated with a local anesthetic agent before making the incision and placing the trocars.   We positioned the patient in reverse Trendelenburg, tilted slightly to the patient's left.  The gallbladder was identified, the fundus grasped and retracted cephalad.  There are some adhesions to the hilum of the gallbladder and there is obvious edema around the hilum of the gallbladder. Adhesions were lysed bluntly and with the electrocautery where indicated, taking care not to injure any adjacent organs or viscus. The infundibulum was grasped and retracted laterally, exposing the peritoneum overlying the triangle of Calot. This was then divided and exposed in a blunt fashion. A critical view of the cystic duct and cystic artery was obtained.  The cystic duct was clearly identified and bluntly dissected circumferentially. The cystic duct was ligated with a clip distally.   An incision was made in the cystic duct and the [facility] cholangiogram catheter introduced. The catheter was secured using a clip. A cholangiogram was then obtained which showed good visualization of the distal and proximal biliary tree with no sign of filling defects or obstruction.  Contrast flowed easily into the duodenum. The catheter was then removed.   The cystic duct was then ligated with clips and divided. The cystic artery was identified, dissected free, ligated with clips and divided as well.   The gallbladder was dissected from the liver bed in retrograde fashion with the electrocautery. The gallbladder was  removed and placed in an  Eco sac. The liver bed was irrigated and inspected. Hemostasis was achieved with the electrocautery. Copious irrigation was utilized and was repeatedly aspirated until clear.  The gallbladder and Eco sac were then removed through the umbilical port site.  The pursestring suture was used to close the umbilical fascia.    We again inspected the right upper quadrant for hemostasis.  Pneumoperitoneum was released as we removed the trocars.  4-0 Monocryl was used to close the skin.   Benzoin, steri-strips, and clean dressings were applied. The patient was then extubated and brought to the recovery room in stable condition. Instrument, sponge, and needle counts were correct at closure and at the conclusion of the case.   Findings: Cholecystitis with Cholelithiasis  Estimated Blood Loss: Minimal         Drains: none         Specimens: Gallbladder           Complications: None; patient tolerated the procedure well.         Disposition: PACU - hemodynamically stable.         Condition: stable  Wilmon Arms. Corliss Skains, MD, [facility]  General/ Trauma Surgery   08/21/2019 12:52 PM

## 2019-08-21 NOTE — Anesthesia Procedure Notes (Signed)
Procedure Name: Intubation Date/Time: 08/21/2019 12:03 PM Performed by: Babs Bertin, CRNA Pre-anesthesia Checklist: Patient identified, Emergency Drugs available, Suction available and Patient being monitored Patient Re-evaluated:Patient Re-evaluated prior to induction Oxygen Delivery Method: Circle System Utilized Preoxygenation: Pre-oxygenation with 100% oxygen Induction Type: IV induction Ventilation: Mask ventilation without difficulty Laryngoscope Size: Mac and 3 Grade View: Grade I Tube type: Oral Tube size: 7.0 mm Number of attempts: 1 Airway Equipment and Method: Stylet and Oral airway Placement Confirmation: ETT inserted through vocal cords under direct vision,  positive ETCO2 and breath sounds checked- equal and bilateral Secured at: 18 cm Tube secured with: Tape Dental Injury: Teeth and Oropharynx as per pre-operative assessment

## 2019-08-21 NOTE — ED Provider Notes (Signed)
Signout from previous provider, Mia McDonald, PA-C at shift change See previous providers note for full H&P  Briefly, patient is 4 months postpartum and presents with right upper quadrant pain wrapping around to her back that began at 1 AM this morning.  Patient was feeling well and her normal self yesterday.  She has had associated nausea and vomiting.  She denies fever.  At shift change, hepatic function panel, lipase, and right upper quadrant ultrasound are pending.  Hepatic function panel returns with elevated AST, ALT, and alk phos.  Lipase within normal limits.  Right upper quadrant ultrasound shows cholelithiasis with focal tenderness and pericholecystic edema consistent with early cholecystitis.  IV Rocephin initiated.  I discussed patient case with Will Jennings, PA-C with general surgery will evaluate the patient.  Anticipate surgery today.  Patient is NPO since 0000 today.  Rapid Covid test ordered.  I appreciate the consultants assistance with the patient.  Patient understands and agrees with plan.   Law, Alexandra M, PA-C 08/21/19 0933    Wickline, Donald, MD 08/21/19 2346  

## 2019-08-21 NOTE — ED Provider Notes (Signed)
Patient seen/examined in the Emergency Department in conjunction with Advanced Practice Provider  Patient reports chest and abdominal pain Exam : awake/alert, RUQ tenderness noted Plan: will evaluate for biliary colic     Ripley Fraise, MD 08/21/19 [phone]

## 2019-08-21 NOTE — ED Provider Notes (Signed)
[facility] EMERGENCY DEPARTMENT Provider Note   CSN: [id number] Arrival date & time: 08/21/19  0155     History   Chief Complaint Chief Complaint  Patient presents with  . Chest Pain    HPI Kelly Gregory is a 33 y.o. female with a h/o rheumatoid arthritis, HTN, and prediabetes who presents to the emergency department with a chief complaint of abdominal pain.   The triage note indicates that the patient is here for chest pain, but the patient points to her right upper quadrant when asked to locate her pain.  She reports non-radiating, constant, colicky, severe pain that awoke her from sleep at 1 AM.  She reports that when the pain intensifies that she becomes short of breath and vomits.  She has had 3 episodes of nonbloody, nonbilious vomiting since onset.  She denies palpitations, cough, fever, chills, diarrhea, dysuria, hematuria, vaginal discharge, orthopnea.  No history of similar.  Surgical history includes C-section x3.  She is a 4 months postpartum currently and is breast-feeding.  Reports that she ate [place] food for dinner.  No history of similar pain.  No known or suspected COVID-19 contacts.     The history is provided by the patient. No language interpreter was used.    Past Medical History:  Diagnosis Date  . Borderline diabetes   . DM (diabetes mellitus) (HCC)   . Elevated blood pressure    no meds  . Gestational diabetes   . Hypertension   . Rheumatoid arthritis (HCC)    Years ago, does not follow, no meds  . Seasonal allergies   . Syncope    As a child, no problems during adulthood.    Patient Active Problem List   Diagnosis Date Noted  . Rheumatoid arthritis (HCC) 07/17/2019  . S/P repeat low transverse C-section 03/27/2019  . Migraines 11/26/2016  . Preventative health care 10/04/2015  . Hyperlipemia 10/04/2015  . Essential hypertension 08/23/2015  . Type 2 diabetes mellitus (HCC) 05/25/2013    Past Surgical History:   Procedure Laterality Date  . CESAREAN SECTION  04/01/2011   Procedure: CESAREAN SECTION;  Surgeon: Oliver Pila;  Location: [facility];  Service: Gynecology;  Laterality: N/A;  . CESAREAN SECTION N/A 05/23/2013   Procedure: CESAREAN SECTION;  Surgeon: Lavina Hamman, MD;  Location: [facility] ORS;  Service: Obstetrics;  Laterality: N/A;  . CESAREAN SECTION N/A 03/27/2019   Procedure: CESAREAN SECTION;  Surgeon: Huel Cote, MD;  Location: [facility] ORS;  Service: Obstetrics;  Laterality: N/A;  [name],  RNFA  . CYSTECTOMY Left    removed from underarm   . DILATION AND EVACUATION N/A 02/01/2018   Procedure: DILATATION AND EVACUATION;  Surgeon: Huel Cote, MD;  Location: [facility];  Service: Gynecology;  Laterality: N/A;  . TOOTH EXTRACTION     wisdom teeth ext     OB History    Gravida  5   Para  2   Term  2   Preterm      AB  2   Living  3     SAB  2   TAB      Ectopic      Multiple  0   Live Births  2            Home Medications    Prior to Admission medications   Medication Sig Start Date End Date Taking? Authorizing Provider  folic acid (FOLVITE) 400 MCG tablet Take 400 mcg by mouth daily.  [provider]  metFORMIN (GLUCOPHAGE-XR) 500 MG 24 hr tablet Take 1 tablet (500 mg total) by mouth 2 (two) times a day. 03/29/19   Bovard-Stuckert, Augusto GambleJody, MD  NIFEdipine (PROCARDIA-XL/NIFEDICAL-XL) 30 MG 24 hr tablet Take 30 mg by mouth daily.     [provider]  predniSONE (DELTASONE) 10 MG tablet 30mg  a day for 3 days, then 20mg  a day for 3 days, then 10mg  a day for 3 days, with food. 07/14/19   Joaquim Namuncan, Graham S, MD  Prenatal Vit-Fe Fumarate-FA (PRENATAL MULTIVITAMIN) TABS tablet Take 1 tablet by mouth daily at 12 noon. 03/29/19   Bovard-Stuckert, Augusto GambleJody, MD    Family History Family History  Adopted: Yes  Problem Relation Age of Onset  . Other Other        Adopted    Social History Social History   Tobacco Use  . Smoking status: Never Smoker   . Smokeless tobacco: Never Used  Substance Use Topics  . Alcohol use: No  . Drug use: No     Allergies   Azithromycin, Gatifloxacin, Tequin, and Penicillins   Review of Systems Review of Systems  Constitutional: Negative for activity change, chills and fever.  Respiratory: Positive for shortness of breath. Negative for cough and wheezing.   Cardiovascular: Negative for chest pain, palpitations and leg swelling.  Gastrointestinal: Positive for abdominal pain, nausea and vomiting. Negative for blood in stool, constipation, diarrhea and rectal pain.  Genitourinary: Negative for dysuria, frequency and urgency.  Musculoskeletal: Negative for back pain, gait problem, myalgias and neck pain.  Skin: Negative for rash.  Allergic/Immunologic: Negative for immunocompromised state.  Neurological: Negative for dizziness, seizures, syncope, weakness and headaches.  Psychiatric/Behavioral: Negative for confusion.    Physical Exam Updated Vital Signs BP (!) 142/91   Pulse (!) 101   Temp 98.3 F (36.8 C) (Oral)   Resp 13   Ht 4\' 9"  (1.448 m)   Wt 70.8 kg   SpO2 98%   BMI 33.76 kg/m   Physical Exam Vitals signs and nursing note reviewed.  Constitutional:      General: She is not in acute distress.    Appearance: She is not ill-appearing, toxic-appearing or diaphoretic.     Comments: Pleasant, well appearing female  HENT:     Head: Normocephalic.  Eyes:     Conjunctiva/sclera: Conjunctivae normal.  Neck:     Musculoskeletal: Neck supple.  Cardiovascular:     Rate and Rhythm: Normal rate and regular rhythm.     Heart sounds: No murmur. No friction rub. No gallop.   Pulmonary:     Effort: Pulmonary effort is normal. No respiratory distress.     Breath sounds: No stridor. No wheezing, rhonchi or rales.  Chest:     Chest wall: No tenderness.  Abdominal:     General: There is no distension.     Palpations: Abdomen is soft. There is no mass.     Tenderness: There is abdominal  tenderness. There is guarding. There is no right CVA tenderness, left CVA tenderness or rebound.     Hernia: No hernia is present.     Comments: Abdomen is obese, but soft and nondistended.  Tender to palpation of the right upper quadrant with a positive Murphy sign.  She has guarding, but no rebound.  No CVA tenderness bilaterally.  The remainder of the abdominal exam is unremarkable.  Musculoskeletal:     Right lower leg: No edema.     Left lower leg: No edema.  Skin:    General: Skin is warm.     Findings: No rash.  Neurological:     Mental Status: She is alert.  Psychiatric:        Behavior: Behavior normal.      ED Treatments / Results  Labs (all labs ordered are listed, but only abnormal results are displayed) Labs Reviewed  BASIC METABOLIC PANEL - Abnormal; Notable for the following components:      Result Value   Glucose, Bld 120 (*)    Calcium 8.5 (*)    All other components within normal limits  CBC - Abnormal; Notable for the following components:   RBC 5.20 (*)    All other components within normal limits  HEPATIC FUNCTION PANEL  LIPASE, BLOOD  I-STAT BETA HCG BLOOD, ED ([facility], [facility], [facility] ONLY)  TROPONIN I (HIGH SENSITIVITY)  TROPONIN I (HIGH SENSITIVITY)    EKG EKG Interpretation  Date/Time:  Monday August 21 2019 02:00:43 EST Ventricular Rate:  102 PR Interval:  142 QRS Duration: 84 QT Interval:  322 QTC Calculation: 419 R Axis:   91 Text Interpretation: Sinus tachycardia Rightward axis Borderline ECG No previous ECGs available Confirmed by Zadie Rhine ([id number]) on 08/21/2019 6:26:52 AM   Radiology Dg Chest 2 View  Result Date: 08/21/2019 CLINICAL DATA:  Chest pain EXAM: CHEST - 2 VIEW COMPARISON:  None. FINDINGS: The heart size and mediastinal contours are within normal limits. Both lungs are clear. The visualized skeletal structures are unremarkable. IMPRESSION: No active cardiopulmonary disease. Electronically Signed   By: Katherine Mantle M.D.    On: 08/21/2019 02:34    Procedures Procedures (including critical care time)  Medications Ordered in ED Medications  sodium chloride flush (NS) 0.9 % injection 3 mL (3 mLs Intravenous Not Given 08/21/19 8251)  HYDROmorphone (DILAUDID) injection 0.5 mg (has no administration in time range)  ondansetron (ZOFRAN) injection 4 mg (has no administration in time range)     Initial Impression / Assessment and Plan / ED Course  I have reviewed the triage vital signs and the nursing notes.  Pertinent labs & imaging results that were available during my care of the patient were reviewed by me and considered in my medical decision making (see chart for details).        33 year old female with a h/o rheumatoid arthritis, HTN, and prediabetes presenting with colicky right upper quadrant pain and vomiting that awoke her from sleep at 1 AM.  The pain intensifies she gets short of breath, but is having no chest pain, cough, or constitutional symptoms.  She is well-appearing and hemodynamically stable in the ER.  On exam, she is focally tender in the right upper quadrant with guarding and a positive Murphy sign. Strong suspicion for biliary colic versus cholecystitis versus choledocholithiasis.  She was initially worked up for chest pain due to her complaints in triage and work-up thus far has been reassuring.  She does not have a leukocytosis.  Will add on LFTs and lipase and order a right upper quadrant ultrasound.  Dilaudid and Zofran ordered for pain and nausea control.  The patient was counseled on pumping and disposing of breast milk after she receives a pain medication prior to breast-feeding again.  Patient care transferred to PA Law at the end of my shift. Patient presentation, ED course, and plan of care discussed with review of all pertinent labs and imaging. Please see his/her note for further details regarding further ED course and disposition.  Final Clinical Impressions(s) /  ED Diagnoses    Final diagnoses:  RUQ pain    ED Discharge Orders    None       Joanne Gavel, PA-C 08/21/19 6147    Ripley Fraise, MD 08/21/19 2347

## 2019-08-21 NOTE — Discharge Summary (Signed)
[facility] Discharge Summary   Patient ID: Kelly Gregory MRN: [id number] DOB/AGE: May 30, 1986 33 y.o.  Admit date: 08/21/2019 Discharge date: 08/21/2019  Admitting Diagnosis: Acute cholecystitis   Discharge Diagnosis Patient Active Problem List   Diagnosis Date Noted  . Cholelithiasis with acute cholecystitis 08/21/2019  . Rheumatoid arthritis (HCC) 07/17/2019  . S/P repeat low transverse C-section 03/27/2019  . Migraines 11/26/2016  . Preventative health care 10/04/2015  . Hyperlipemia 10/04/2015  . Essential hypertension 08/23/2015  . Type 2 diabetes mellitus (HCC) 05/25/2013    Consultants None  Imaging: Dg Chest 2 View  Result Date: 08/21/2019 CLINICAL DATA:  Chest pain EXAM: CHEST - 2 VIEW COMPARISON:  None. FINDINGS: The heart size and mediastinal contours are within normal limits. Both lungs are clear. The visualized skeletal structures are unremarkable. IMPRESSION: No active cardiopulmonary disease. Electronically Signed   By: Katherine Mantle M.D.   On: 08/21/2019 02:34   US Abdomen Limited Ruq  Result Date: 08/21/2019 CLINICAL DATA:  Right upper quadrant pain EXAM: ULTRASOUND ABDOMEN LIMITED RIGHT UPPER QUADRANT COMPARISON:  None. FINDINGS: Gallbladder: Cholelithiasis including a stone measuring nearly 2 cm. Gallbladder is full and there is focal tenderness. No convincing gallbladder wall thickening when remeasured. Mild pericholecystic fluid. Common bile duct: Diameter: 6.5 mm. Where visualized, no filling defect. The CBD towards the pancreas was not visible. Liver: Echogenic liver with pericholecystic sparing. Portal vein is patent on color Doppler imaging with normal direction of blood flow towards the liver. IMPRESSION: 1. Cholelithiasis. There is focal tenderness and mild pericholecystic edema-possible early cholecystitis. 2. Prominent for age common bile duct diameter without visible stone. Electronically Signed   By: Marnee Spring M.D.   On:  08/21/2019 07:12    Procedures Dr. Corliss Skains (08/21/19) - Laparoscopic Cholecystectomy with [facility]  Hospital Course:  Kelly Gregory is a 33yo female 4 months postpartum who presented to [facility] 12/7 with acute onset abdominal pain.  Workup showed early acute cholecystitis.  Patient was admitted and underwent procedure listed above.  Tolerated procedure well. She was transferred to the PACU postoperatively.  On POD0, the patient was voiding well, tolerating diet, ambulating well, pain well controlled, vital signs stable, incisions c/d/i and felt stable for discharge home.  Patient will follow up as below and knows to call with questions or concerns.    I have personally reviewed the patients medication history on the [place] controlled substance database.    Allergies as of 08/21/2019      Reactions   Azithromycin Nausea And Vomiting   Gatifloxacin Nausea And Vomiting, Other (See Comments)   Tequin Nausea And Vomiting   Penicillins Hives, Rash, Other (See Comments)   Has patient had a PCN reaction causing immediate rash, facial/tongue/throat swelling, SOB or lightheadedness with hypotension: Unknown Has patient had a PCN reaction causing severe rash involving mucus membranes or skin necrosis: Unknown Has patient had a PCN reaction that required hospitalization: Unknown Has patient had a PCN reaction occurring within the last 10 years: No If all of the above answers are "NO", then may proceed with Cephalosporin use.      Medication List    STOP taking these medications   predniSONE 10 MG tablet Commonly known as: DELTASONE     TAKE these medications   folic acid 400 MCG tablet Commonly known as: FOLVITE Take 400 mcg by mouth daily.   metFORMIN 500 MG 24 hr tablet Commonly known as: GLUCOPHAGE-XR Take 1 tablet (500 mg total) by mouth 2 (two) times  a day.   NIFEdipine 30 MG 24 hr tablet Commonly known as: PROCARDIA-XL/NIFEDICAL-XL Take 30 mg by mouth daily.   oxyCODONE 5 MG immediate release  tablet Commonly known as: Oxy IR/ROXICODONE Take 1 tablet (5 mg total) by mouth every 6 (six) hours as needed for severe pain.   prenatal multivitamin Tabs tablet Take 1 tablet by mouth daily at 12 noon.        Follow-up Information    [facility] [place]. Call on 09/12/2019.   Specialty: General Surgery Why: Follow up appointment scheduled for 12/29 at 8:30am. A provider will call you during scheduled appointment time. Please send a photo of incisions,license, and insurance card to [email or web address] the day prior to appointment.   Contact information: [address]  [place] [address] [phone]        Pleas Koch, NP Follow up.   Specialty: Internal Medicine Why: let them know you had surgery and follow up for medical issues. Contact information: [place] [address] [phone]           Signed: Wellington Hampshire, [facility] 08/21/2019, 1:03 PM Please see Amion for pager number during day hours 7:00am-4:30pm

## 2019-08-21 NOTE — ED Notes (Signed)
Consent signed and with pt.

## 2019-08-21 NOTE — Anesthesia Preprocedure Evaluation (Addendum)
Anesthesia Evaluation  Patient identified by MRN, date of birth, ID band Patient awake    Reviewed: Allergy & Precautions, NPO status , Patient's Chart, lab work & pertinent test results  Airway Mallampati: II  TM Distance: >3 FB Neck ROM: Full    Dental  (+) Teeth Intact   Pulmonary neg pulmonary ROS,    breath sounds clear to auscultation       Cardiovascular hypertension, negative cardio ROS   Rhythm:Regular Rate:Normal     Neuro/Psych  Headaches, negative psych ROS   GI/Hepatic negative GI ROS, Neg liver ROS,   Endo/Other  negative endocrine ROSdiabetes, Oral Hypoglycemic Agents  Renal/GU negative Renal ROS  negative genitourinary   Musculoskeletal  (+) Arthritis , Rheumatoid disorders,    Abdominal   Peds  Hematology negative hematology ROS (+)   Anesthesia Other Findings   Reproductive/Obstetrics                            Anesthesia Physical Anesthesia Plan  ASA: II  Anesthesia Plan: General   Post-op Pain Management:    Induction: Intravenous  PONV Risk Score and Plan: Midazolam, Dexamethasone and Ondansetron  Airway Management Planned: Oral ETT  Additional Equipment:   Intra-op Plan:   Post-operative Plan: Extubation in OR  Informed Consent: I have reviewed the patients History and Physical, chart, labs and discussed the procedure including the risks, benefits and alternatives for the proposed anesthesia with the patient or authorized representative who has indicated his/her understanding and acceptance.     Dental advisory given  Plan Discussed with: CRNA  Anesthesia Plan Comments:         Anesthesia Quick Evaluation

## 2019-08-21 NOTE — ED Triage Notes (Signed)
Patient reports chest tightness/pressure across her chest this evening with emesis and SOB , no cough or fever .

## 2019-08-21 NOTE — H&P (Signed)
[facility] Admission Note  Kelly Gregory [facility] 08/26/1986  [id number].    Requesting MD: Ripley Fraise Chief Complaint: Chest tightness pressure across her chest, emesis and shortness of breath  Reason for Consult: Cholecystitis   HPI: Patient is a 33 year old female, 4 months postpartum with rheumatoid arthritis hypertension and prediabetes who presents to the ED with abdominal pain.  She reported pain that woke her up around 1 AM she was short of breath and was vomiting.  She presented to the ED for evaluation early this a.m.  Work-up shows she was afebrile.  Blood pressure is elevated on admission but better now.  No significant tachycardia.  BMP is normal, LFTs showed alk phos of 137, lipase of 50, AST 293, ALT 199, direct bilirubin is 1.0.  Chest x-ray shows no active cardiopulmonary disease.  Abdominal ultrasound shows cholelithiasis with a 2 cm gallstone, no convincing gallbladder wall thickening when remeasured mild pericholecystic fluid.  CBD is 6.5 mm no filling defects.  Impression was cholelithiasis with focal tenderness and mild pericholecystic edema possibly early cholecystitis.  Currently her pain is much improved with pain medicines, but still tender RLQ, she said it really hurt when they did US exam.    ROS: Review of Systems  Constitutional: Negative.   HENT: Negative.   Eyes: Negative.   Respiratory: Negative.   Cardiovascular: Negative.   Gastrointestinal: Positive for abdominal pain, nausea and vomiting. Negative for blood in stool, constipation, diarrhea and heartburn.  Genitourinary: Negative.   Musculoskeletal: Negative.   Skin: Negative.   Neurological: Negative.   Endo/Heme/Allergies: Negative.   Psychiatric/Behavioral: Negative.     Family History  Adopted: Yes  Problem Relation Age of Onset  . Other Other        Adopted    Past Medical History:  Diagnosis Date  . Borderline diabetes   . DM (diabetes mellitus) ([place])   . Elevated blood  pressure    no meds  . Gestational diabetes   . Hypertension   . Rheumatoid arthritis ([place])    Years ago, does not follow, no meds  . Seasonal allergies   . Syncope    As a child, no problems during adulthood.    Past Surgical History:  Procedure Laterality Date  . CESAREAN SECTION  04/01/2011   Procedure: CESAREAN SECTION;  Surgeon: Logan Bores;  Location: [facility];  Service: Gynecology;  Laterality: N/A;  . CESAREAN SECTION N/A 05/23/2013   Procedure: CESAREAN SECTION;  Surgeon: Cheri Fowler, MD;  Location: [facility];  Service: Obstetrics;  Laterality: N/A;  . CESAREAN SECTION N/A 03/27/2019   Procedure: CESAREAN SECTION;  Surgeon: Paula Compton, MD;  Location: [facility];  Service: Obstetrics;  Laterality: N/A;  [name],  RNFA  . CYSTECTOMY Left    removed from underarm   . DILATION AND EVACUATION N/A 02/01/2018   Procedure: DILATATION AND EVACUATION;  Surgeon: Paula Compton, MD;  Location: [facility];  Service: Gynecology;  Laterality: N/A;  . TOOTH EXTRACTION     wisdom teeth ext    Social History:  reports that she has never smoked. She has never used smokeless tobacco. She reports that she does not drink alcohol or use drugs.  Allergies:  Allergies  Allergen Reactions  . Azithromycin Nausea And Vomiting  . Gatifloxacin Nausea And Vomiting and Other (See Comments)  . Tequin Nausea And Vomiting  . Penicillins Hives, Rash and Other (See Comments)    Has patient had a PCN reaction causing immediate rash, facial/tongue/throat swelling,  SOB or lightheadedness with hypotension: Unknown Has patient had a PCN reaction causing severe rash involving mucus membranes or skin necrosis: Unknown Has patient had a PCN reaction that required hospitalization: Unknown Has patient had a PCN reaction occurring within the last 10 years: No If all of the above answers are "NO", then may proceed with Cephalosporin use.     Prior to Admission medications   Medication Sig Start Date End  Date Taking? Authorizing Provider  folic acid (FOLVITE) 361 MCG tablet Take 400 mcg by mouth daily.    [provider]  metFORMIN (GLUCOPHAGE-XR) 500 MG 24 hr tablet Take 1 tablet (500 mg total) by mouth 2 (two) times a day. 03/29/19   Bovard-Stuckert, Jeral Fruit, MD  NIFEdipine (PROCARDIA-XL/NIFEDICAL-XL) 30 MG 24 hr tablet Take 30 mg by mouth daily.     [provider]  predniSONE (DELTASONE) 10 MG tablet 71m a day for 3 days, then 271ma day for 3 days, then 1080m day for 3 days, with food. 07/14/19   DunTonia GhentD  Prenatal Vit-Fe Fumarate-FA (PRENATAL MULTIVITAMIN) TABS tablet Take 1 tablet by mouth daily at 12 noon. 03/29/19   Bovard-Stuckert, JodJeral FruitD      Blood pressure 131/84, pulse 78, temperature 98.3 F (36.8 C), temperature source Oral, resp. rate 20, height 4' 9"  (1.448 m), weight 70.8 kg, SpO2 96 %, unknown if currently breastfeeding. Physical Exam: Physical Exam Constitutional:      General: She is not in acute distress.    Appearance: She is well-developed. She is obese. She is not ill-appearing, toxic-appearing or diaphoretic.     Comments: 4 months post partum, and breast feeding  HENT:     Head: Normocephalic.  Eyes:     Comments: Pupils are equal  Neck:     Musculoskeletal: Normal range of motion and neck supple.     Thyroid: No thyromegaly.     Vascular: No hepatojugular reflux.     Trachea: No tracheal deviation.  Cardiovascular:     Rate and Rhythm: Normal rate and regular rhythm.     Heart sounds: Normal heart sounds.  Pulmonary:     Effort: Pulmonary effort is normal.     Breath sounds: Normal breath sounds.  Abdominal:     General: Bowel sounds are normal.     Palpations: Abdomen is soft.     Comments: Still tender RUQ 3 prior C sections  Musculoskeletal:     Right lower leg: No edema.     Left lower leg: No edema.     Comments: Good distal pulses  Skin:    General: Skin is warm and dry.     Capillary Refill: Capillary refill  takes less than 2 seconds.  Neurological:     General: No focal deficit present.     Mental Status: She is alert and oriented to person, place, and time.     Cranial Nerves: No cranial nerve deficit.  Psychiatric:        Mood and Affect: Mood normal. Mood is not anxious.        Behavior: Behavior normal. Behavior is not agitated.     Results for orders placed or performed during the hospital encounter of 08/21/19 (from the past 48 hour(s))  Basic metabolic panel     Status: Abnormal   Collection Time: 08/21/19  2:26 AM  Result Value Ref Range   Sodium 138 135 - 145 mmol/L   Potassium 3.6 3.5 - 5.1 mmol/L   Chloride  104 98 - 111 mmol/L   CO2 24 22 - 32 mmol/L   Glucose, Bld 120 (H) 70 - 99 mg/dL   BUN 12 6 - 20 mg/dL   Creatinine, Ser 0.61 0.44 - 1.00 mg/dL   Calcium 8.5 (L) 8.9 - 10.3 mg/dL   GFR calc non Af Amer >60 >60 mL/min   GFR calc Af Amer >60 >60 mL/min   Anion gap 10 5 - 15    Comment: Performed at [facility] [address] [place], [place] [address]  CBC     Status: Abnormal   Collection Time: 08/21/19  2:26 AM  Result Value Ref Range   WBC 7.6 4.0 - 10.5 K/uL   RBC 5.20 (H) 3.87 - 5.11 MIL/uL   Hemoglobin 14.8 12.0 - 15.0 g/dL   HCT 44.6 36.0 - 46.0 %   MCV 85.8 80.0 - 100.0 fL   MCH 28.5 26.0 - 34.0 pg   MCHC 33.2 30.0 - 36.0 g/dL   RDW 13.1 11.5 - 15.5 %   Platelets 349 150 - 400 K/uL   nRBC 0.0 0.0 - 0.2 %    Comment: Performed at [facility], [place] [address] [place], [place] [address]  Troponin I (High Sensitivity)     Status: None   Collection Time: 08/21/19  2:26 AM  Result Value Ref Range   Troponin I (High Sensitivity) 3 <18 ng/L    Comment: (NOTE) Elevated high sensitivity troponin I (hsTnI) values and significant  changes across serial measurements may suggest ACS but many other  chronic and acute conditions are known to elevate hsTnI results.  Refer to the "Links" section for chest pain algorithms and additional   guidance. Performed at [facility], [place] [address], [place], [place] [address]   I-Stat beta hCG blood, ED     Status: None   Collection Time: 08/21/19  2:49 AM  Result Value Ref Range   I-stat hCG, quantitative <5.0 <5 mIU/mL   Comment 3            Comment:   GEST. AGE      CONC.  (mIU/mL)   <=1 WEEK        5 - 50     2 WEEKS       50 - 500     3 WEEKS       100 - 10,000     4 WEEKS     1,000 - 30,000        FEMALE AND NON-PREGNANT FEMALE:     LESS THAN 5 mIU/mL   Troponin I (High Sensitivity)     Status: None   Collection Time: 08/21/19  4:47 AM  Result Value Ref Range   Troponin I (High Sensitivity) 2 <18 ng/L    Comment: (NOTE) Elevated high sensitivity troponin I (hsTnI) values and significant  changes across serial measurements may suggest ACS but many other  chronic and acute conditions are known to elevate hsTnI results.  Refer to the "Links" section for chest pain algorithms and additional  guidance. Performed at [facility], [place] [address] [place],  [address]   Hepatic function panel     Status: Abnormal   Collection Time: 08/21/19  7:23 AM  Result Value Ref Range   Total Protein 8.5 (H) 6.5 - 8.1 g/dL   Albumin 3.4 (L) 3.5 - 5.0 g/dL   AST 293 (H) 15 - 41 U/L    Comment:  RESULTS CONFIRMED BY MANUAL DILUTION   ALT 199 (H) 0 - 44 U/L   Alkaline Phosphatase 137 (H) 38 - 126 U/L   Total Bilirubin 1.0 0.3 - 1.2 mg/dL   Bilirubin, Direct 0.5 (H) 0.0 - 0.2 mg/dL   Indirect Bilirubin 0.5 0.3 - 0.9 mg/dL    Comment: Performed at [facility] [address], [place], [place] [address]  Lipase, blood     Status: None   Collection Time: 08/21/19  7:23 AM  Result Value Ref Range   Lipase 50 11 - 51 U/L    Comment: Performed at [facility] [address] [place], [place] [address]   Dg Chest 2 View  Result Date: 08/21/2019 CLINICAL DATA:  Chest pain EXAM: CHEST - 2 VIEW COMPARISON:  None. FINDINGS: The heart size and mediastinal  contours are within normal limits. Both lungs are clear. The visualized skeletal structures are unremarkable. IMPRESSION: No active cardiopulmonary disease. Electronically Signed   By: Constance Holster M.D.   On: 08/21/2019 02:34   US Abdomen Limited Ruq  Result Date: 08/21/2019 CLINICAL DATA:  Right upper quadrant pain EXAM: ULTRASOUND ABDOMEN LIMITED RIGHT UPPER QUADRANT COMPARISON:  None. FINDINGS: Gallbladder: Cholelithiasis including a stone measuring nearly 2 cm. Gallbladder is full and there is focal tenderness. No convincing gallbladder wall thickening when remeasured. Mild pericholecystic fluid. Common bile duct: Diameter: 6.5 mm. Where visualized, no filling defect. The CBD towards the pancreas was not visible. Liver: Echogenic liver with pericholecystic sparing. Portal vein is patent on color Doppler imaging with normal direction of blood flow towards the liver. IMPRESSION: 1. Cholelithiasis. There is focal tenderness and mild pericholecystic edema-possible early cholecystitis. 2. Prominent for age common bile duct diameter without visible stone. Electronically Signed   By: Monte Fantasia M.D.   On: 08/21/2019 07:12      Assessment/Plan 4 months postpartum Hx prediabetes Hx rheumatoid arthritis Hx hypertension   Cholelithiasis/cholecystitis Elevated LFT's  FEN:  NPO/IV fluids ID:  Rocephin Follow up:  [facility] DVT:  SCD's  Plan:  OR for laparoscopic cholecystectomy and possible intraoperative cholangiogram later this AM.  COVID pending, IV fluids/antibiotics.        Earnstine Regal [facility] 08/21/2019, 9:03 AM Please see Amion for pager number during day hours 7:00am-4:30pm

## 2019-08-22 ENCOUNTER — Encounter (HOSPITAL_COMMUNITY): Payer: Self-pay | Admitting: Surgery

## 2019-08-22 LAB — SURGICAL PATHOLOGY

## 2019-08-28 ENCOUNTER — Other Ambulatory Visit: Payer: BC Managed Care – PPO

## 2019-08-28 ENCOUNTER — Other Ambulatory Visit (INDEPENDENT_AMBULATORY_CARE_PROVIDER_SITE_OTHER): Payer: BC Managed Care – PPO

## 2019-08-28 ENCOUNTER — Other Ambulatory Visit: Payer: Self-pay | Admitting: Primary Care

## 2019-08-28 DIAGNOSIS — R7989 Other specified abnormal findings of blood chemistry: Secondary | ICD-10-CM | POA: Diagnosis not present

## 2019-08-28 LAB — COMPREHENSIVE METABOLIC PANEL
ALT: 72 U/L — ABNORMAL HIGH (ref 0–35)
AST: 20 U/L (ref 0–37)
Albumin: 3.4 g/dL — ABNORMAL LOW (ref 3.5–5.2)
Alkaline Phosphatase: 142 U/L — ABNORMAL HIGH (ref 39–117)
BUN: 9 mg/dL (ref 6–23)
CO2: 24 mEq/L (ref 19–32)
Calcium: 9 mg/dL (ref 8.4–10.5)
Chloride: 104 mEq/L (ref 96–112)
Creatinine, Ser: 0.53 mg/dL (ref 0.40–1.20)
GFR: 130.82 mL/min (ref >60.00)
Glucose, Bld: 139 mg/dL — ABNORMAL HIGH (ref 70–99)
Potassium: 4 mEq/L (ref 3.5–5.1)
Sodium: 135 mEq/L (ref 135–145)
Total Bilirubin: 0.4 mg/dL (ref 0.2–1.2)
Total Protein: 7.2 g/dL (ref 6.0–8.3)

## 2019-09-19 DIAGNOSIS — M7989 Other specified soft tissue disorders: Secondary | ICD-10-CM | POA: Diagnosis not present

## 2019-09-19 DIAGNOSIS — R5383 Other fatigue: Secondary | ICD-10-CM | POA: Diagnosis not present

## 2019-09-19 DIAGNOSIS — M0579 Rheumatoid arthritis with rheumatoid factor of multiple sites without organ or systems involvement: Secondary | ICD-10-CM | POA: Diagnosis not present

## 2019-12-06 ENCOUNTER — Encounter: Payer: Self-pay | Admitting: Primary Care

## 2019-12-06 ENCOUNTER — Other Ambulatory Visit: Payer: Self-pay

## 2019-12-06 ENCOUNTER — Ambulatory Visit (INDEPENDENT_AMBULATORY_CARE_PROVIDER_SITE_OTHER): Payer: BC Managed Care – PPO | Admitting: Primary Care

## 2019-12-06 VITALS — BP 122/78 | HR 77 | Temp 97.3°F | Wt 152.0 lb

## 2019-12-06 DIAGNOSIS — N3946 Mixed incontinence: Secondary | ICD-10-CM

## 2019-12-06 DIAGNOSIS — E119 Type 2 diabetes mellitus without complications: Secondary | ICD-10-CM | POA: Diagnosis not present

## 2019-12-06 DIAGNOSIS — M069 Rheumatoid arthritis, unspecified: Secondary | ICD-10-CM

## 2019-12-06 HISTORY — DX: Mixed incontinence: N39.46

## 2019-12-06 LAB — POCT GLYCOSYLATED HEMOGLOBIN (HGB A1C): Hemoglobin A1C: 5.7 % — AB (ref 4.0–5.6)

## 2019-12-06 NOTE — Assessment & Plan Note (Signed)
Following with endocrinology, now managed on Enbrel every other week, doesn't know her exact dose. Also on PRN prednisone a few days before her injections.  She will notify us of doses via My Chart later today. Will add to medication list.

## 2019-12-06 NOTE — Assessment & Plan Note (Signed)
Since postpartum three months ago. Referral placed to pelvic floor PT.

## 2019-12-06 NOTE — Patient Instructions (Signed)
Continue Metformin twice daily for diabetes.  Please update me with the dose of your Enbrel and Prednisone.  Monitor your blood sugars several times weekly, notify me if you start to see readings at or above 150 on a consistent basis.  You will be contacted regarding your referral to physical therapy.  Please let us know if you have not been contacted within two weeks.   Please schedule a follow up appointment in 6 months.   It was a pleasure to see you today!

## 2019-12-06 NOTE — Assessment & Plan Note (Addendum)
Well controlled on metformin XR 500 mg BID. Continue same.  Discussed to monitor glucose readings when on prednisone PRN. She will report readings that are consistently at or above 150.   Follow up in 6 months.

## 2019-12-06 NOTE — Progress Notes (Signed)
Subjective:    Patient ID: Kelly Gregory, female    DOB: [birth year], 34 y.o.   MRN: [id number]  HPI  This visit occurred during the SARS-CoV-2 public health emergency.  Safety protocols were in place, including screening questions prior to the visit, additional usage of staff PPE, and extensive cleaning of exam room while observing appropriate contact time as indicated for disinfecting solutions.   Kelly Gregory is a 34 year old female with a history of type 2 diabetes, rheumatoid arthritis, hyperlipidemia, migraines, hypertension who presents today for follow up of diabetes. She would also like to discuss urinary incontinence.  Current medications include: Metformin XR 500 mg BID  She is checking her blood glucose 0 times daily.   Last A1C: 5.7 in September 2020, 5.7 today. Last Eye Exam: No recent exam Last Foot Exam: Due in September 2021 Pneumonia Vaccination: Completed in 2017 ACE/ARB: None, urine microalbumin negative in September 2020 Statin: None  BP Readings from Last 3 Encounters:  12/06/19 122/78  08/21/19 (!) 113/96  07/14/19 (!) 144/102   She would like to see a pelvic floor physical therapist. Kelly Gregory been exercising at the gym and has noticed urge and stress incontinence. This began after the birth of her 57 old baby, increased when she started exercising at the gym.   Review of Systems  Respiratory: Negative for shortness of breath.   Cardiovascular: Negative for chest pain.  Genitourinary:       Urinary incontinence   Neurological: Negative for dizziness and numbness.       Past Medical History:  Diagnosis Date  . Borderline diabetes   . DM (diabetes mellitus) ([place])   . Elevated blood pressure    no meds  . Gestational diabetes   . Hypertension   . Rheumatoid arthritis ([place])    Years ago, does not follow, no meds  . Seasonal allergies   . Syncope    As a child, no problems during adulthood.     Social History   Socioeconomic History  .  Marital status: Married    Spouse name: Not on file  . Number of children: Not on file  . Years of education: Not on file  . Highest education level: Not on file  Occupational History  . Not on file  Tobacco Use  . Smoking status: Never Smoker  . Smokeless tobacco: Never Used  Substance and Sexual Activity  . Alcohol use: No  . Drug use: No  . Sexual activity: Yes    Comment: approx [redacted] wks gestation  Other Topics Concern  . Not on file  Social History Narrative   Married.   2 children.   Works as a Transport planner.   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   Social Determinants of Health   Financial Resource Strain: Low Risk   . Difficulty of Paying Living Expenses: Not hard at all  Food Insecurity: No Food Insecurity  . Worried About Charity fundraiser in the Last Year: Never true  . Ran Out of Food in the Last Year: Never true  Transportation Needs: Unknown  . Lack of Transportation (Medical): No  . Lack of Transportation (Non-Medical): Not on file  Physical Activity:   . Days of Exercise per Week:   . Minutes of Exercise per Session:   Stress: No Stress Concern Present  . Feeling of Stress : Not at all  Social Connections:   . Frequency of Communication with Friends and Family:   . Frequency  of Social Gatherings with Friends and Family:   . Attends Religious Services:   . Active Member of Clubs or Organizations:   . Attends Banker Meetings:   Marland Kitchen Marital Status:   Intimate Partner Violence: Not At Risk  . Fear of Current or Ex-Partner: No  . Emotionally Abused: No  . Physically Abused: No  . Sexually Abused: No    Past Surgical History:  Procedure Laterality Date  . CESAREAN SECTION  04/01/2011   Procedure: CESAREAN SECTION;  Surgeon: Oliver Pila;  Location: [facility];  Service: Gynecology;  Laterality: N/A;  . CESAREAN SECTION N/A 05/23/2013   Procedure: CESAREAN SECTION;  Surgeon: Lavina Hamman, MD;  Location: [facility];  Service:  Obstetrics;  Laterality: N/A;  . CESAREAN SECTION N/A 03/27/2019   Procedure: CESAREAN SECTION;  Surgeon: Huel Cote, MD;  Location: [facility];  Service: Obstetrics;  Laterality: N/A;  [name],  RNFA  . CHOLECYSTECTOMY N/A 08/21/2019   Procedure: LAPAROSCOPIC CHOLECYSTECTOMY with IOC;  Surgeon: Manus Rudd, MD;  Location: [facility];  Service: General;  Laterality: N/A;  . CYSTECTOMY Left    removed from underarm   . DILATION AND EVACUATION N/A 02/01/2018   Procedure: DILATATION AND EVACUATION;  Surgeon: Huel Cote, MD;  Location: [facility];  Service: Gynecology;  Laterality: N/A;  . TOOTH EXTRACTION     wisdom teeth ext    Family History  Adopted: Yes  Problem Relation Age of Onset  . Other Other        Adopted    Allergies  Allergen Reactions  . Azithromycin Nausea And Vomiting  . Gatifloxacin Nausea And Vomiting and Other (See Comments)  . Tequin Nausea And Vomiting  . Penicillins Hives, Rash and Other (See Comments)    Has patient had a PCN reaction causing immediate rash, facial/tongue/throat swelling, SOB or lightheadedness with hypotension: Unknown Has patient had a PCN reaction causing severe rash involving mucus membranes or skin necrosis: Unknown Has patient had a PCN reaction that required hospitalization: Unknown Has patient had a PCN reaction occurring within the last 10 years: No If all of the above answers are "NO", then may proceed with Cephalosporin use.     Current Outpatient Medications on File Prior to Visit  Medication Sig Dispense Refill  . metFORMIN (GLUCOPHAGE-XR) 500 MG 24 hr tablet Take 1 tablet (500 mg total) by mouth 2 (two) times a day. 180 tablet 3  . NIFEdipine (PROCARDIA-XL/NIFEDICAL-XL) 30 MG 24 hr tablet Take 30 mg by mouth daily.      No current facility-administered medications on file prior to visit.    BP 122/78   Pulse 77   Temp (!) 97.3 F (36.3 C) (Temporal)   Wt 152 lb (68.9 kg)   LMP 11/20/2019   SpO2 98%   BMI 32.89  kg/m    Objective:   Physical Exam  Constitutional: She appears well-nourished.  Cardiovascular: Normal rate and regular rhythm.  Respiratory: Effort normal and breath sounds normal.  Musculoskeletal:     Cervical back: Neck supple.  Skin: Skin is warm and dry.  Psychiatric: She has a normal mood and affect.           Assessment & Plan:

## 2019-12-07 DIAGNOSIS — N941 Unspecified dyspareunia: Secondary | ICD-10-CM | POA: Diagnosis not present

## 2019-12-07 DIAGNOSIS — N3946 Mixed incontinence: Secondary | ICD-10-CM | POA: Diagnosis not present

## 2019-12-07 DIAGNOSIS — M6281 Muscle weakness (generalized): Secondary | ICD-10-CM | POA: Diagnosis not present

## 2019-12-12 DIAGNOSIS — M6281 Muscle weakness (generalized): Secondary | ICD-10-CM | POA: Diagnosis not present

## 2019-12-12 DIAGNOSIS — N3946 Mixed incontinence: Secondary | ICD-10-CM | POA: Diagnosis not present

## 2019-12-12 DIAGNOSIS — N941 Unspecified dyspareunia: Secondary | ICD-10-CM | POA: Diagnosis not present

## 2019-12-19 DIAGNOSIS — M6281 Muscle weakness (generalized): Secondary | ICD-10-CM | POA: Diagnosis not present

## 2019-12-19 DIAGNOSIS — N3946 Mixed incontinence: Secondary | ICD-10-CM | POA: Diagnosis not present

## 2019-12-19 DIAGNOSIS — N941 Unspecified dyspareunia: Secondary | ICD-10-CM | POA: Diagnosis not present

## 2019-12-20 DIAGNOSIS — M7989 Other specified soft tissue disorders: Secondary | ICD-10-CM | POA: Diagnosis not present

## 2019-12-20 DIAGNOSIS — M0579 Rheumatoid arthritis with rheumatoid factor of multiple sites without organ or systems involvement: Secondary | ICD-10-CM | POA: Diagnosis not present

## 2019-12-21 DIAGNOSIS — N3946 Mixed incontinence: Secondary | ICD-10-CM | POA: Diagnosis not present

## 2019-12-21 DIAGNOSIS — M6281 Muscle weakness (generalized): Secondary | ICD-10-CM | POA: Diagnosis not present

## 2019-12-21 DIAGNOSIS — N941 Unspecified dyspareunia: Secondary | ICD-10-CM | POA: Diagnosis not present

## 2019-12-26 DIAGNOSIS — N941 Unspecified dyspareunia: Secondary | ICD-10-CM | POA: Diagnosis not present

## 2019-12-26 DIAGNOSIS — M6281 Muscle weakness (generalized): Secondary | ICD-10-CM | POA: Diagnosis not present

## 2019-12-26 DIAGNOSIS — N3946 Mixed incontinence: Secondary | ICD-10-CM | POA: Diagnosis not present

## 2020-01-04 DIAGNOSIS — N941 Unspecified dyspareunia: Secondary | ICD-10-CM | POA: Diagnosis not present

## 2020-01-04 DIAGNOSIS — N3946 Mixed incontinence: Secondary | ICD-10-CM | POA: Diagnosis not present

## 2020-01-04 DIAGNOSIS — M6281 Muscle weakness (generalized): Secondary | ICD-10-CM | POA: Diagnosis not present

## 2020-01-11 DIAGNOSIS — M6281 Muscle weakness (generalized): Secondary | ICD-10-CM | POA: Diagnosis not present

## 2020-01-11 DIAGNOSIS — N941 Unspecified dyspareunia: Secondary | ICD-10-CM | POA: Diagnosis not present

## 2020-01-11 DIAGNOSIS — N3946 Mixed incontinence: Secondary | ICD-10-CM | POA: Diagnosis not present

## 2020-01-16 DIAGNOSIS — N3946 Mixed incontinence: Secondary | ICD-10-CM | POA: Diagnosis not present

## 2020-01-16 DIAGNOSIS — M6281 Muscle weakness (generalized): Secondary | ICD-10-CM | POA: Diagnosis not present

## 2020-01-16 DIAGNOSIS — N941 Unspecified dyspareunia: Secondary | ICD-10-CM | POA: Diagnosis not present

## 2020-01-18 DIAGNOSIS — N941 Unspecified dyspareunia: Secondary | ICD-10-CM | POA: Diagnosis not present

## 2020-01-18 DIAGNOSIS — M6281 Muscle weakness (generalized): Secondary | ICD-10-CM | POA: Diagnosis not present

## 2020-01-18 DIAGNOSIS — N3946 Mixed incontinence: Secondary | ICD-10-CM | POA: Diagnosis not present

## 2020-01-23 DIAGNOSIS — N941 Unspecified dyspareunia: Secondary | ICD-10-CM | POA: Diagnosis not present

## 2020-01-23 DIAGNOSIS — N3946 Mixed incontinence: Secondary | ICD-10-CM | POA: Diagnosis not present

## 2020-01-23 DIAGNOSIS — M6281 Muscle weakness (generalized): Secondary | ICD-10-CM | POA: Diagnosis not present

## 2020-01-25 DIAGNOSIS — N3946 Mixed incontinence: Secondary | ICD-10-CM | POA: Diagnosis not present

## 2020-01-25 DIAGNOSIS — M6281 Muscle weakness (generalized): Secondary | ICD-10-CM | POA: Diagnosis not present

## 2020-01-25 DIAGNOSIS — N941 Unspecified dyspareunia: Secondary | ICD-10-CM | POA: Diagnosis not present

## 2020-02-13 DIAGNOSIS — N3946 Mixed incontinence: Secondary | ICD-10-CM | POA: Diagnosis not present

## 2020-02-13 DIAGNOSIS — M6281 Muscle weakness (generalized): Secondary | ICD-10-CM | POA: Diagnosis not present

## 2020-02-13 DIAGNOSIS — N941 Unspecified dyspareunia: Secondary | ICD-10-CM | POA: Diagnosis not present

## 2020-03-11 IMAGING — US US ABDOMEN LIMITED
1 series · 14 of 25 positions shown · non-contrast
Comparison: None.

CLINICAL DATA: Right upper quadrant pain

EXAM:
ULTRASOUND ABDOMEN LIMITED RIGHT UPPER QUADRANT

[Series 1: us abdomen limited · 14 of 42 slices shown]
[im 1/42]
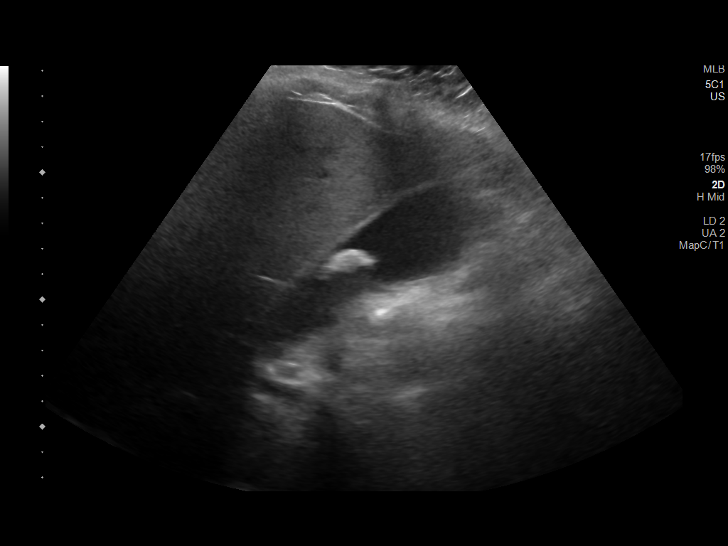
[im 4/42]
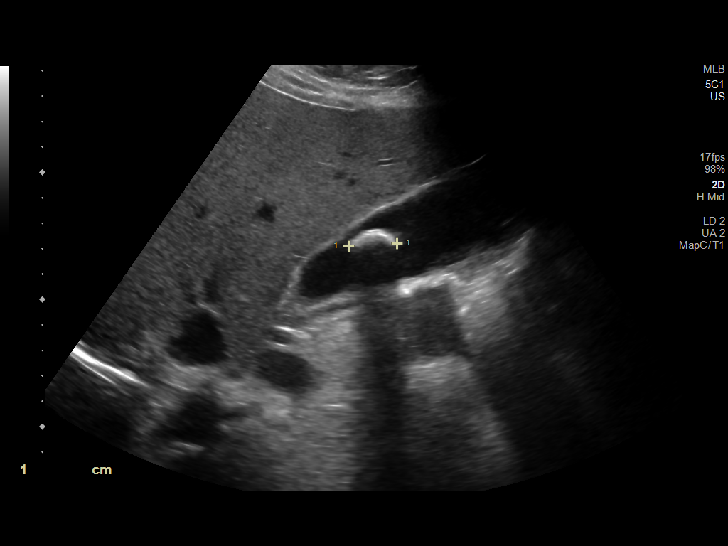
[im 7/42]
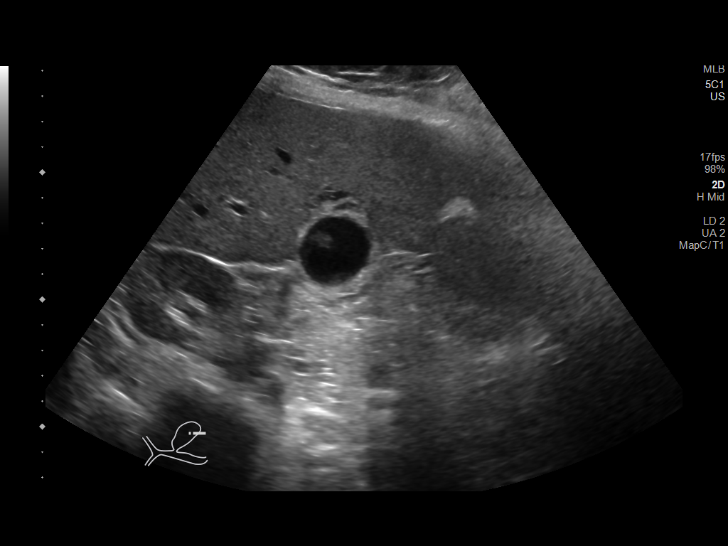
[im 11/42]
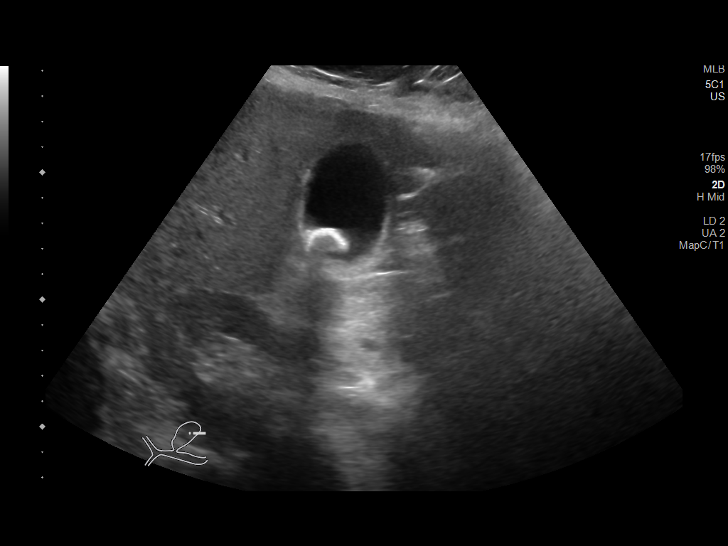
[im 14/42]
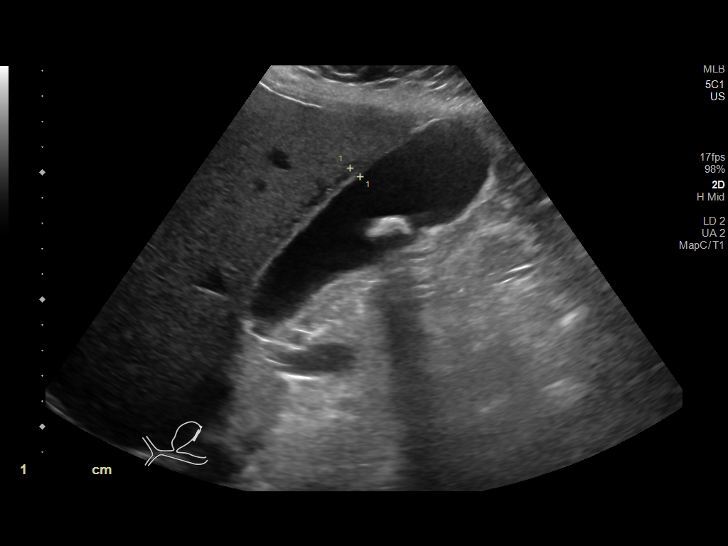
[im 16/42]
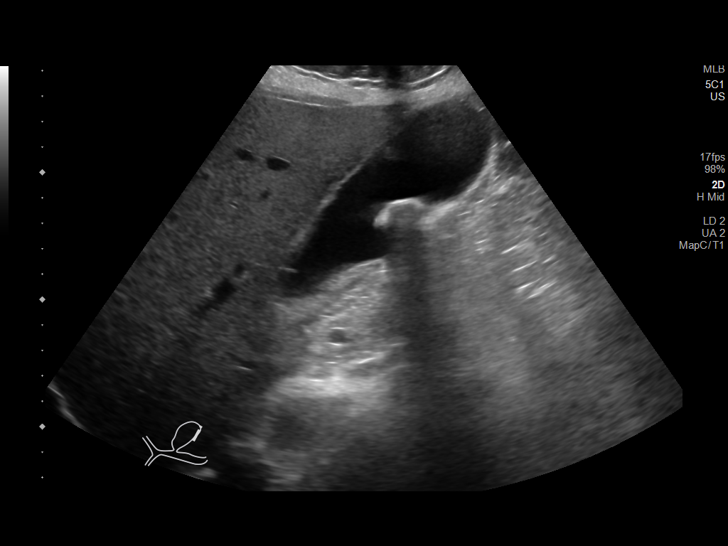
[im 19/42]
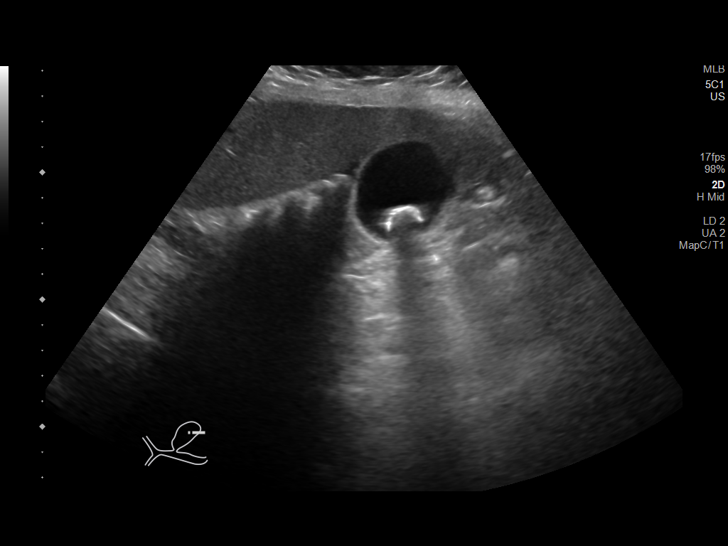
[im 23/42]
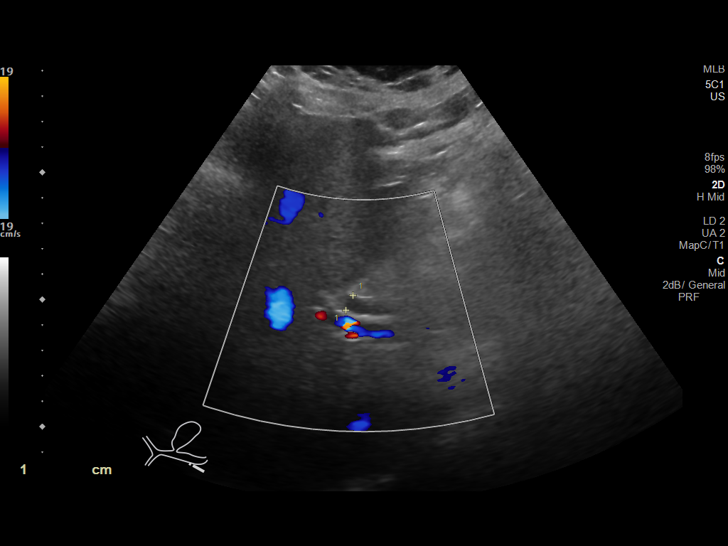
[im 26/42]
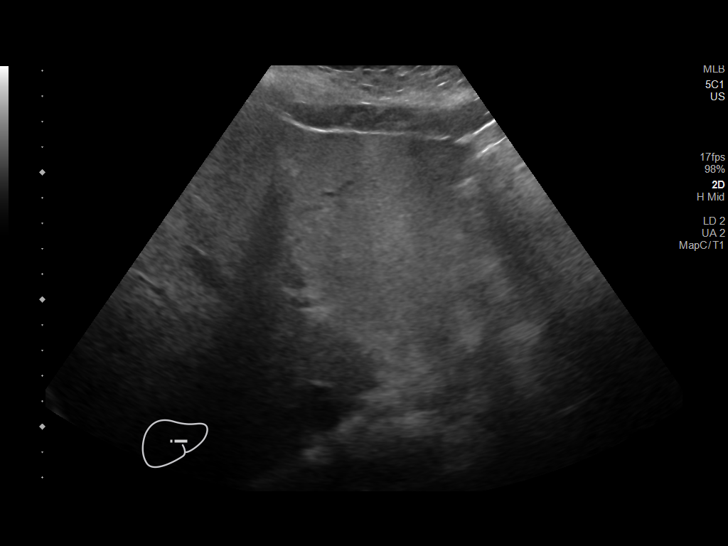
[im 28/42]
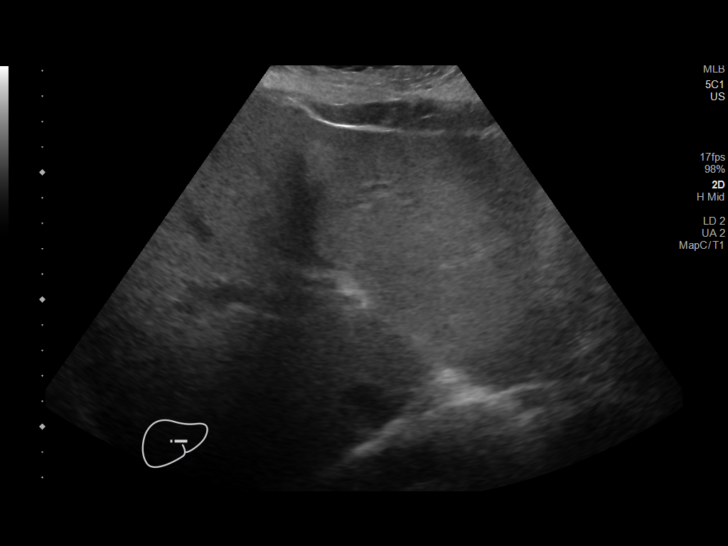
[im 31/42]
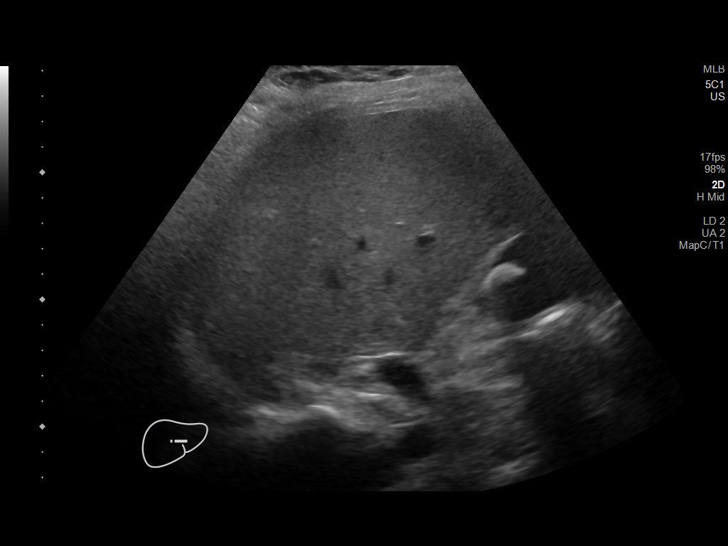
[im 35/42]
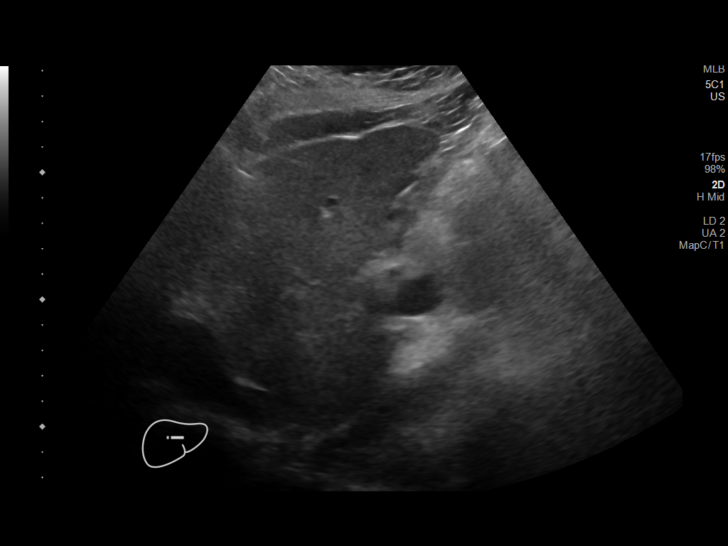
[im 38/42]
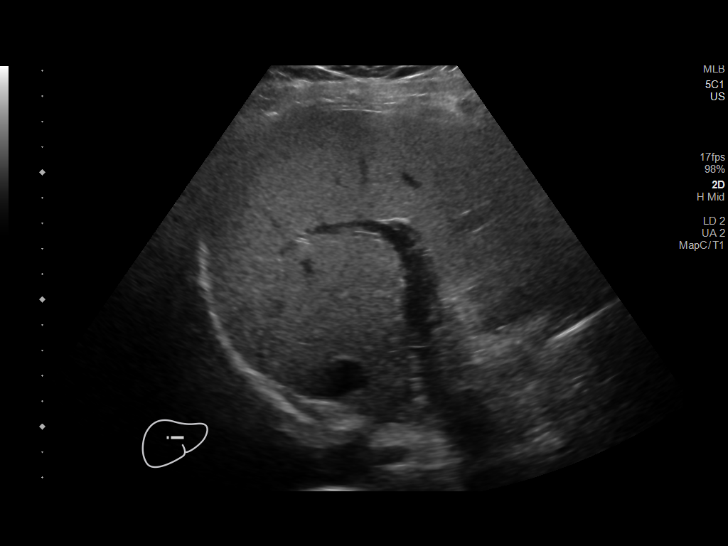
[im 42/42]
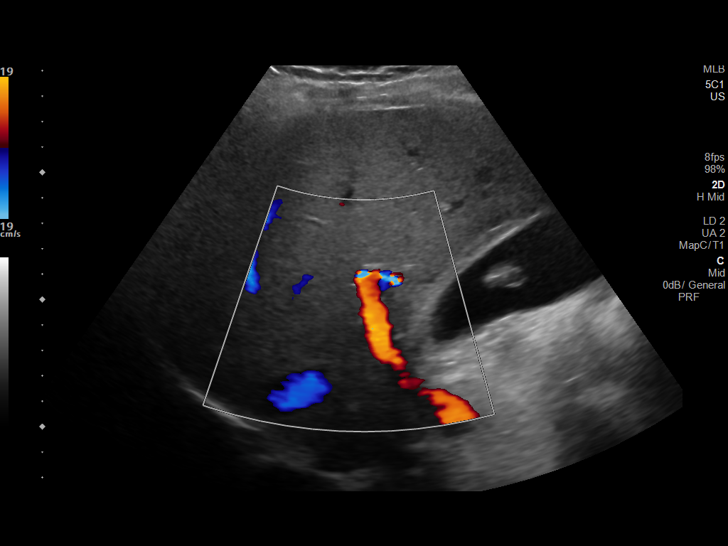

[14 of 25 positions shown; findings below may reference images not displayed]

FINDINGS: Gallbladder:

Cholelithiasis including a stone measuring nearly 2 cm. Gallbladder
is full and there is focal tenderness. No convincing gallbladder
wall thickening when remeasured. Mild pericholecystic fluid.

Common bile duct:

Diameter: 6.5 mm. Where visualized, no filling defect. The CBD
towards the pancreas was not visible.

Liver:

Echogenic liver with pericholecystic sparing. Portal vein is patent
on color Doppler imaging with normal direction of blood flow towards
the liver.
IMPRESSION: 1. Cholelithiasis. There is focal tenderness and mild
pericholecystic edema-possible early cholecystitis.
2. Prominent for age common bile duct diameter without visible
stone.

## 2020-03-11 IMAGING — DX DG CHEST 2V
2 series · 2 of 2 positions shown · non-contrast
Comparison: None.

CLINICAL DATA: Chest pain

EXAM:
CHEST - 2 VIEW

[chest pa]
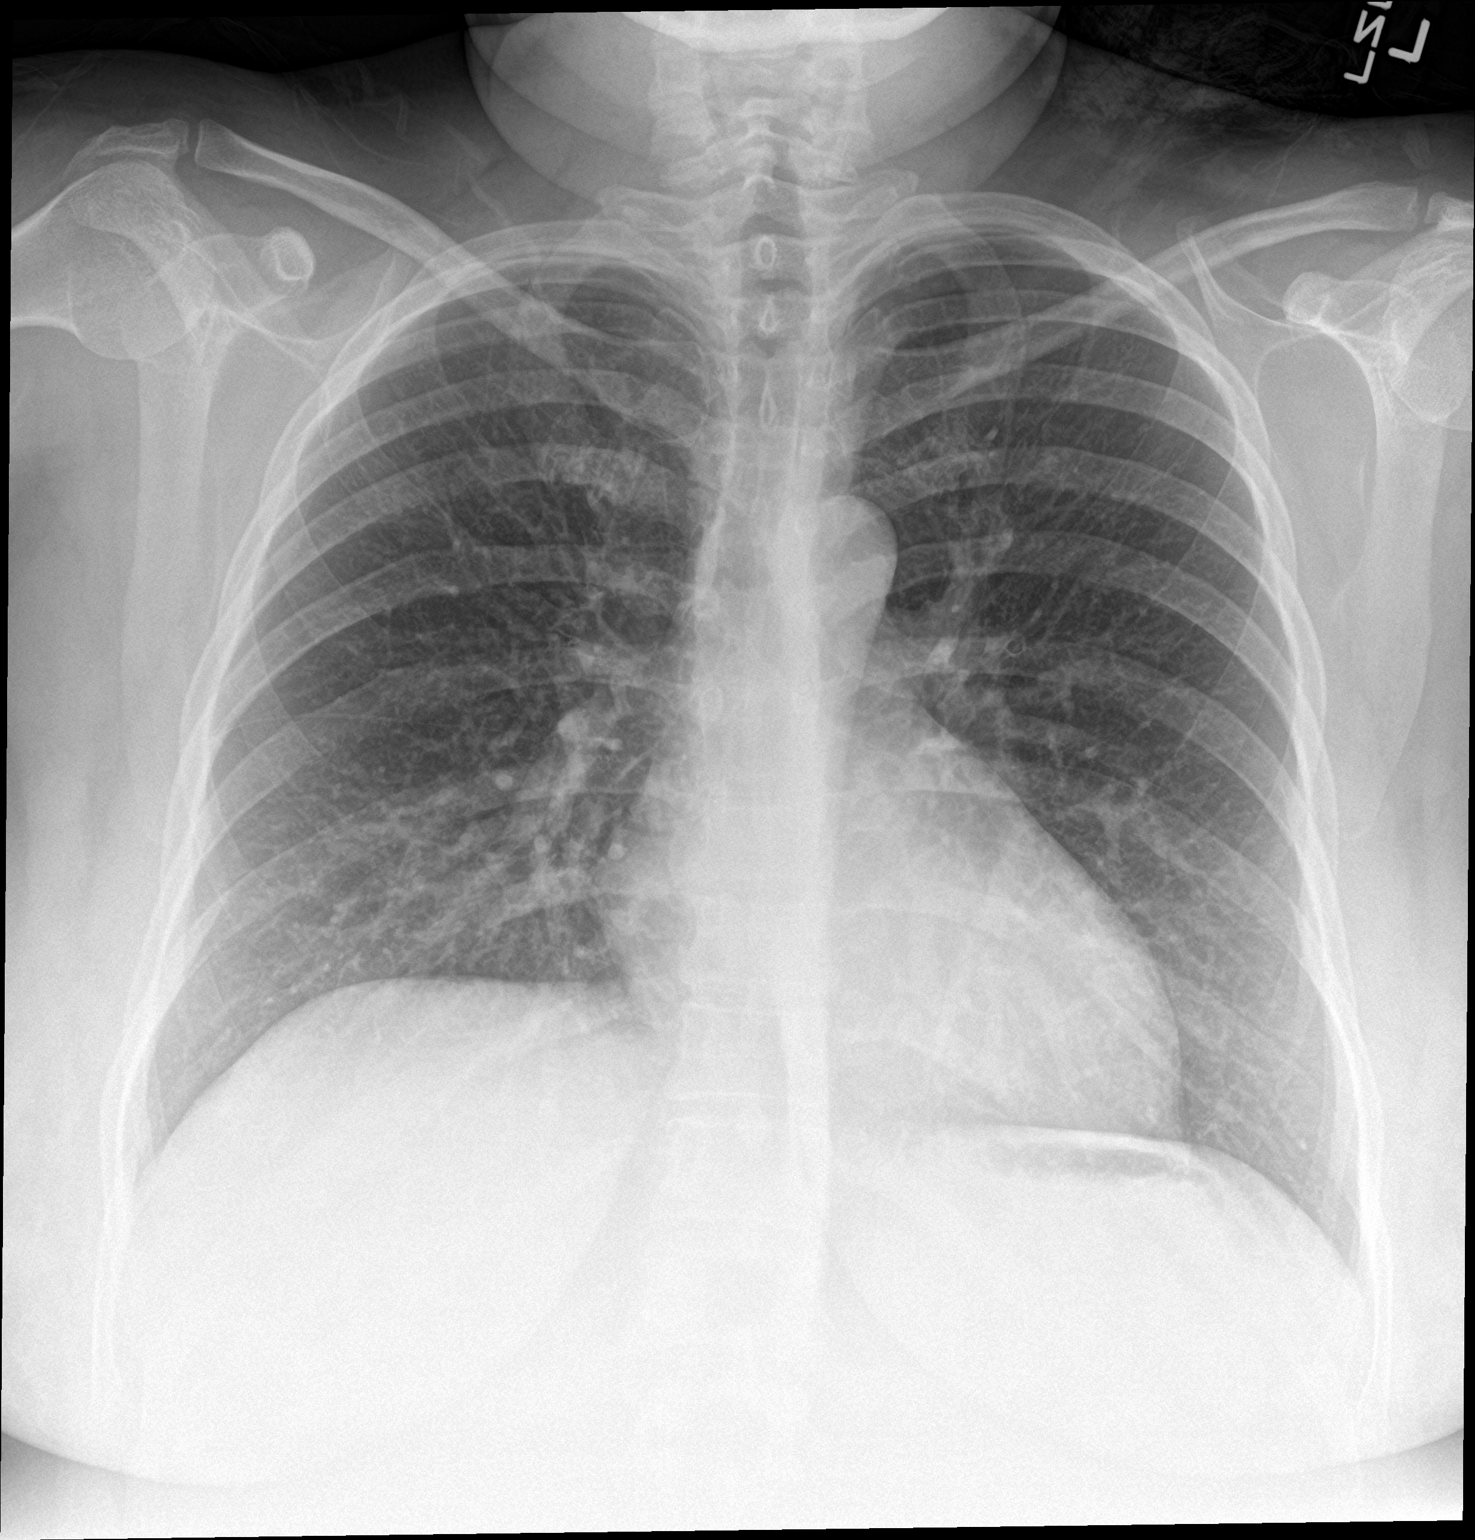

[chest lat]
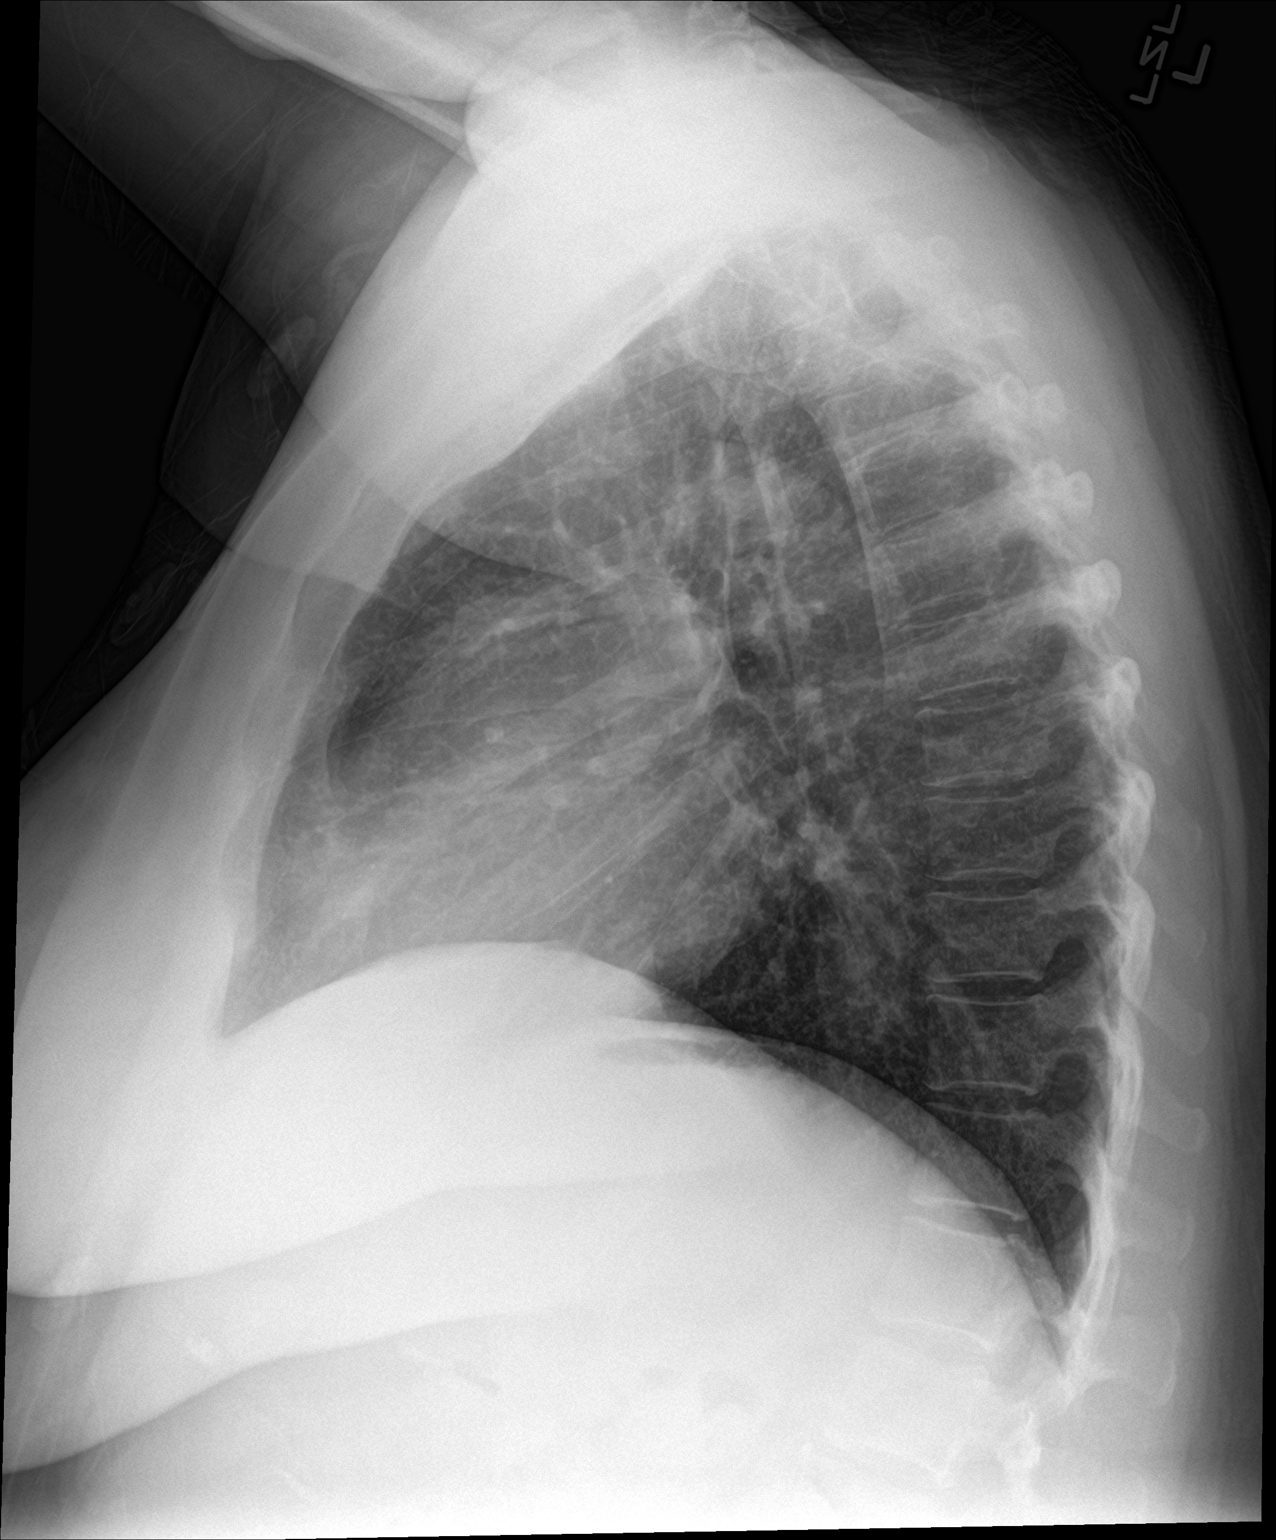

[2 of 2 positions shown; findings below may reference images not displayed]

FINDINGS: The heart size and mediastinal contours are within normal limits.
Both lungs are clear. The visualized skeletal structures are
unremarkable.
IMPRESSION: No active cardiopulmonary disease.

## 2020-03-11 IMAGING — RF DG CHOLANGIOGRAM OPERATIVE
1 series · 4 of 4 positions shown · non-contrast
Comparison: Ultrasound 08/21/2019

CLINICAL DATA: Cholelithiasis.

EXAM:
INTRAOPERATIVE CHOLANGIOGRAM
TECHNIQUE: Cholangiographic images from the C-arm fluoroscopic device were
submitted for interpretation post-operatively. Please see the
procedural report for the amount of contrast and the fluoroscopy
time utilized.

[Series 1: run · 4 of 55 frames shown]
[frame 9/55]
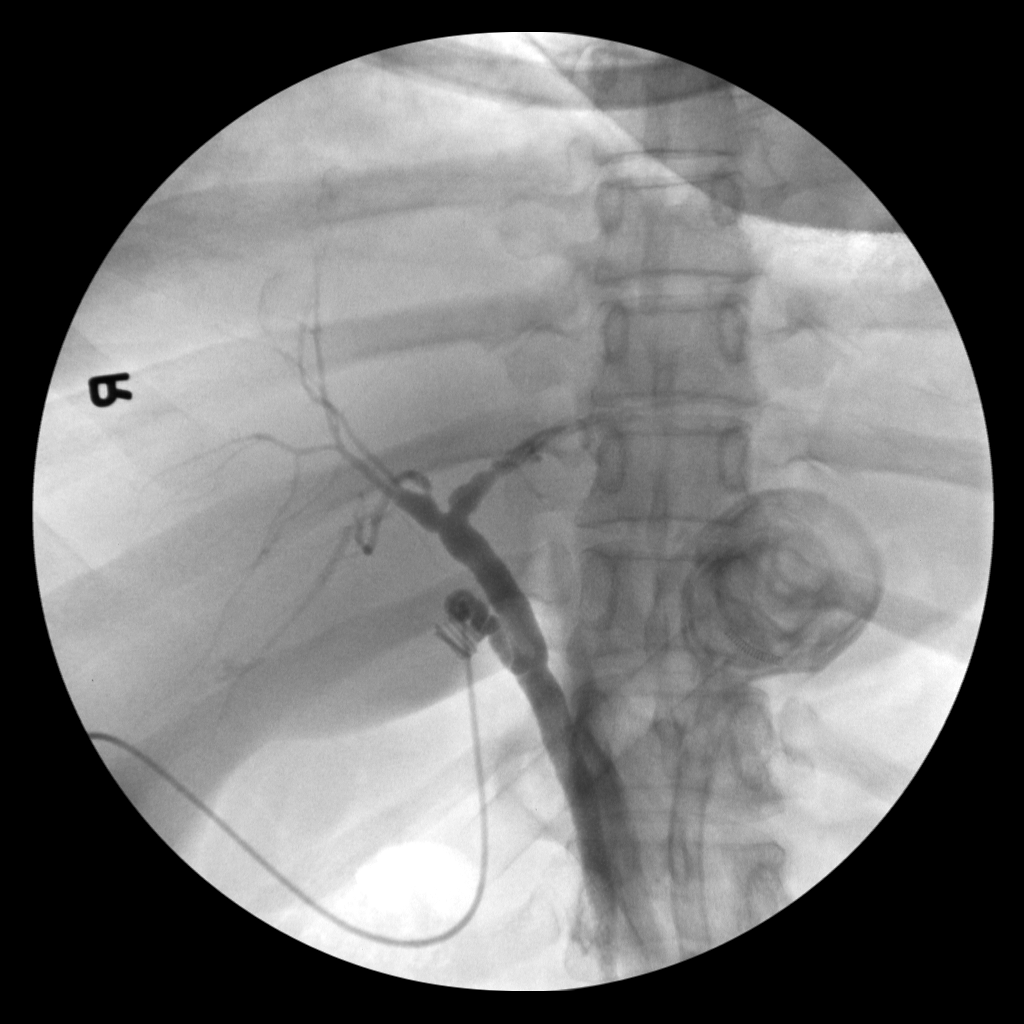
[frame 25/55]
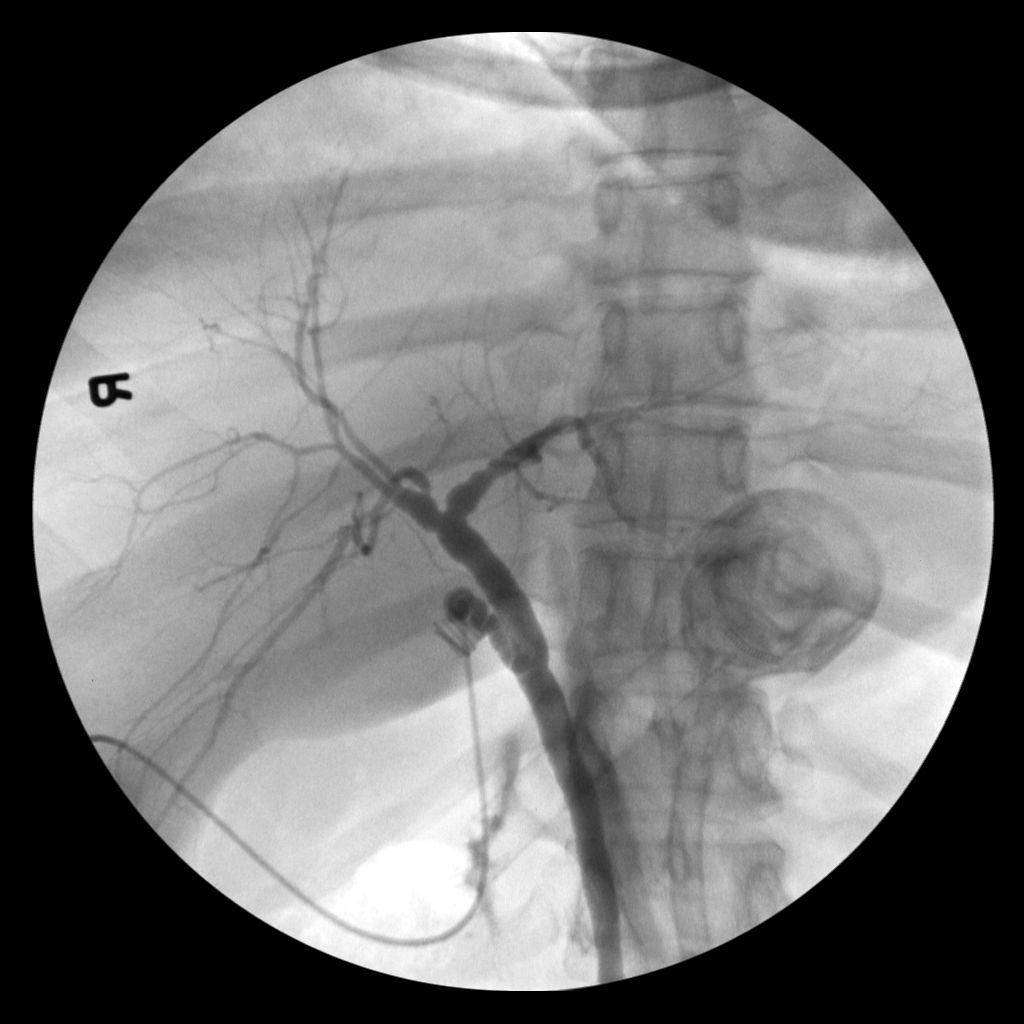
[frame 28/55]
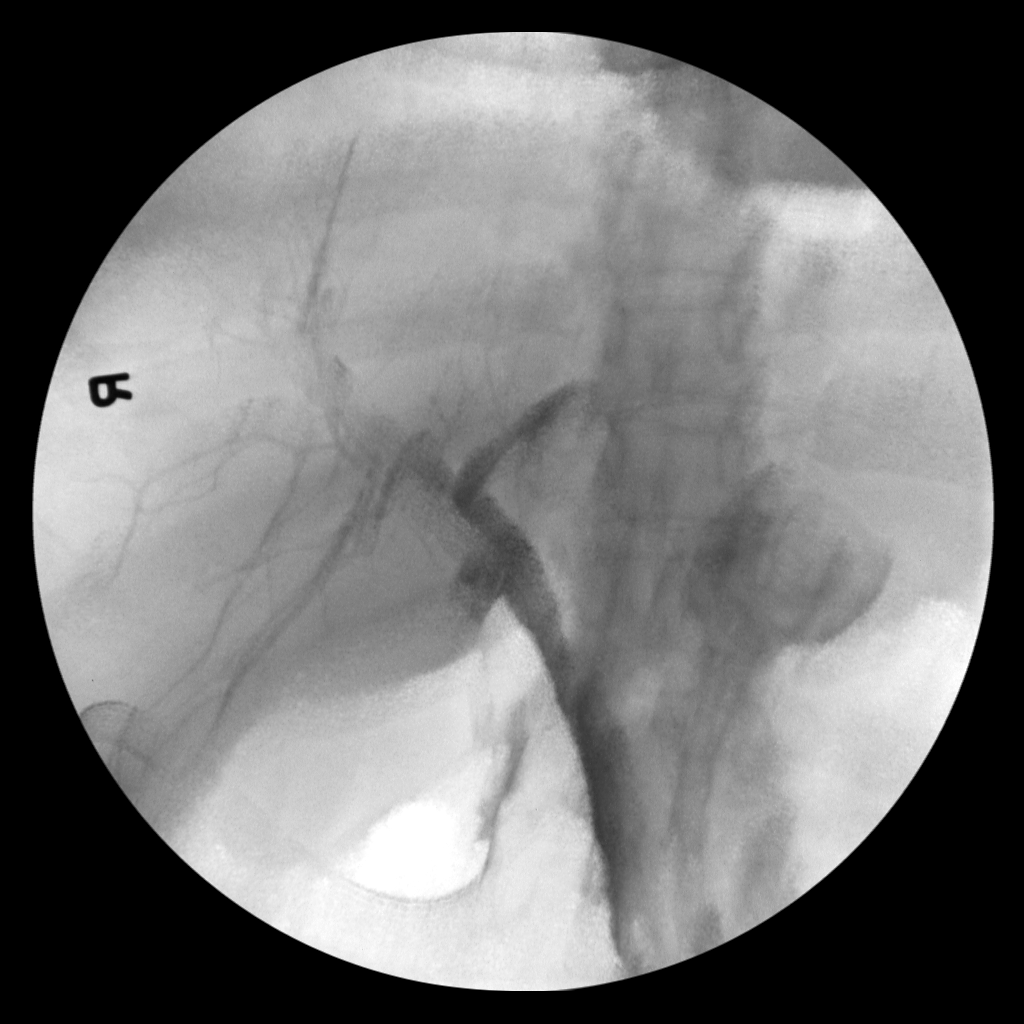
[frame 47/55]
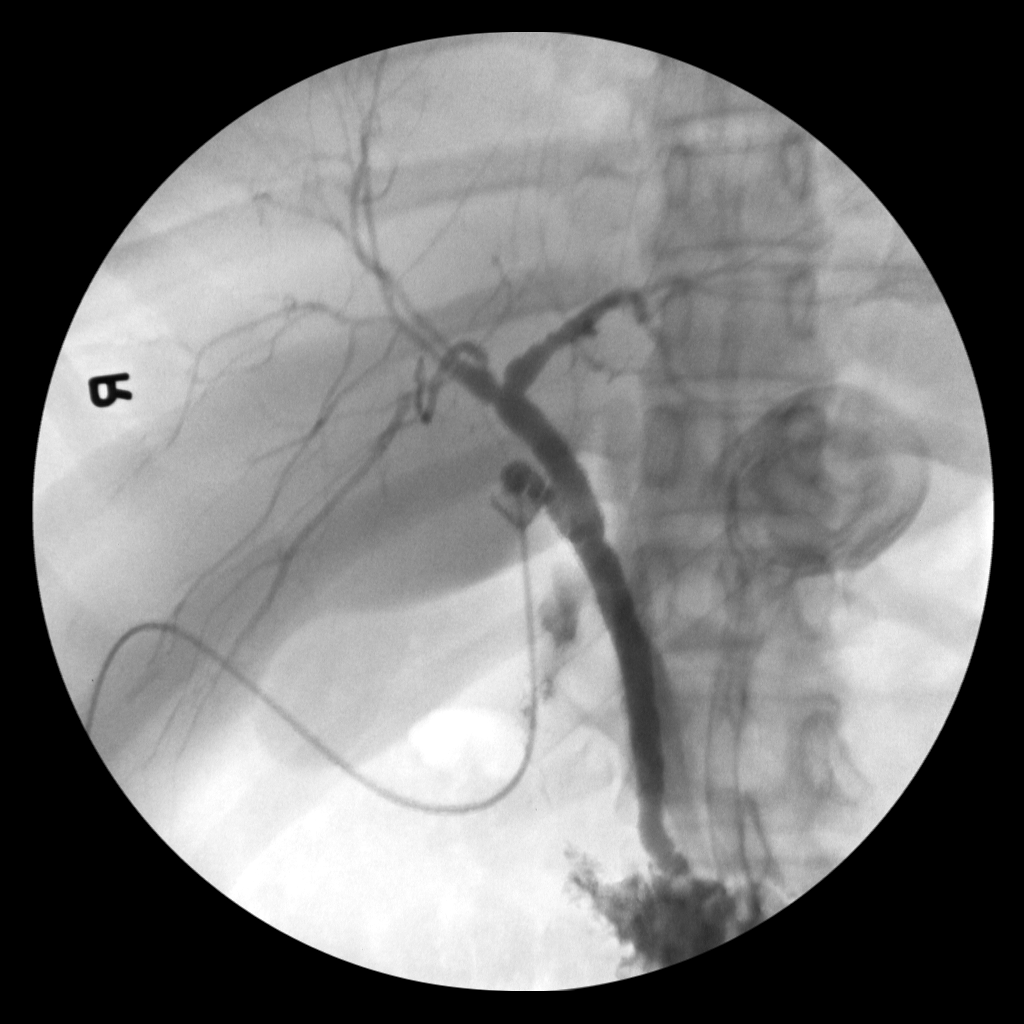

[4 of 4 positions shown; findings below may reference images not displayed]

FINDINGS: Cystic duct is patent. Contrast fills the intrahepatic and
extrahepatic biliary system. Common bile duct is patent. Contrast
drains into the duodenum. No large filling defects in the biliary
system.
IMPRESSION: Patent biliary system.  Normal intraoperative cholangiogram.

## 2020-05-22 DIAGNOSIS — Z01419 Encounter for gynecological examination (general) (routine) without abnormal findings: Secondary | ICD-10-CM | POA: Diagnosis not present

## 2020-05-22 DIAGNOSIS — E669 Obesity, unspecified: Secondary | ICD-10-CM | POA: Diagnosis not present

## 2020-05-22 DIAGNOSIS — I10 Essential (primary) hypertension: Secondary | ICD-10-CM

## 2020-05-22 DIAGNOSIS — Z1389 Encounter for screening for other disorder: Secondary | ICD-10-CM | POA: Diagnosis not present

## 2020-05-22 DIAGNOSIS — Z130 Encounter for screening for diseases of the blood and blood-forming organs and certain disorders involving the immune mechanism: Secondary | ICD-10-CM | POA: Diagnosis not present

## 2020-05-22 MED ORDER — NIFEDIPINE ER OSMOTIC RELEASE 30 MG PO TB24: 30.0000 mg | ORAL_TABLET | Freq: Every day | ORAL | 0 refills | Status: DC

## 2020-06-20 DIAGNOSIS — Z79899 Other long term (current) drug therapy: Secondary | ICD-10-CM | POA: Diagnosis not present

## 2020-06-20 DIAGNOSIS — M0579 Rheumatoid arthritis with rheumatoid factor of multiple sites without organ or systems involvement: Secondary | ICD-10-CM | POA: Diagnosis not present

## 2020-09-04 ENCOUNTER — Other Ambulatory Visit: Payer: Self-pay | Admitting: Primary Care

## 2020-09-04 DIAGNOSIS — I10 Essential (primary) hypertension: Secondary | ICD-10-CM

## 2020-11-08 ENCOUNTER — Encounter: Payer: Self-pay | Admitting: Primary Care

## 2020-11-08 ENCOUNTER — Ambulatory Visit (INDEPENDENT_AMBULATORY_CARE_PROVIDER_SITE_OTHER): Payer: 59 | Admitting: Primary Care

## 2020-11-08 ENCOUNTER — Other Ambulatory Visit: Payer: Self-pay

## 2020-11-08 VITALS — BP 132/82 | HR 81 | Temp 97.6°F | Ht <= 58 in | Wt 153.0 lb

## 2020-11-08 DIAGNOSIS — E119 Type 2 diabetes mellitus without complications: Secondary | ICD-10-CM | POA: Diagnosis not present

## 2020-11-08 DIAGNOSIS — Z0000 Encounter for general adult medical examination without abnormal findings: Secondary | ICD-10-CM | POA: Diagnosis not present

## 2020-11-08 DIAGNOSIS — I10 Essential (primary) hypertension: Secondary | ICD-10-CM

## 2020-11-08 DIAGNOSIS — Z1159 Encounter for screening for other viral diseases: Secondary | ICD-10-CM

## 2020-11-08 DIAGNOSIS — E785 Hyperlipidemia, unspecified: Secondary | ICD-10-CM

## 2020-11-08 DIAGNOSIS — G43909 Migraine, unspecified, not intractable, without status migrainosus: Secondary | ICD-10-CM

## 2020-11-08 DIAGNOSIS — M069 Rheumatoid arthritis, unspecified: Secondary | ICD-10-CM

## 2020-11-08 LAB — CBC
HCT: 43.3 % (ref 36.0–46.0)
Hemoglobin: 15 g/dL (ref 12.0–15.0)
MCHC: 34.5 g/dL (ref 30.0–36.0)
MCV: 84.9 fl (ref 78.0–100.0)
Platelets: 292 10*3/uL (ref 150.0–400.0)
RBC: 5.1 Mil/uL (ref 3.87–5.11)
RDW: 13.8 % (ref 11.5–15.5)
WBC: 8 10*3/uL (ref 4.0–10.5)

## 2020-11-08 LAB — COMPREHENSIVE METABOLIC PANEL
ALT: 29 U/L (ref 0–35)
AST: 20 U/L (ref 0–37)
Albumin: 3.6 g/dL (ref 3.5–5.2)
Alkaline Phosphatase: 82 U/L (ref 39–117)
BUN: 13 mg/dL (ref 6–23)
CO2: 26 mEq/L (ref 19–32)
Calcium: 8.7 mg/dL (ref 8.4–10.5)
Chloride: 98 mEq/L (ref 96–112)
Creatinine, Ser: 0.61 mg/dL (ref 0.40–1.20)
GFR: 114.71 mL/min (ref >60.00)
Glucose, Bld: 135 mg/dL — ABNORMAL HIGH (ref 70–99)
Potassium: 3.6 mEq/L (ref 3.5–5.1)
Sodium: 132 mEq/L — ABNORMAL LOW (ref 135–145)
Total Bilirubin: 0.5 mg/dL (ref 0.2–1.2)
Total Protein: 7.3 g/dL (ref 6.0–8.3)

## 2020-11-08 LAB — LIPID PANEL
Cholesterol: 233 mg/dL — ABNORMAL HIGH (ref 0–200)
HDL: 42.9 mg/dL (ref >39.00)
LDL Cholesterol: 154 mg/dL — ABNORMAL HIGH (ref 0–99)
NonHDL: 190.36
Total CHOL/HDL Ratio: 5
Triglycerides: 184 mg/dL — ABNORMAL HIGH (ref 0.0–149.0)
VLDL: 36.8 mg/dL (ref 0.0–40.0)

## 2020-11-08 LAB — MICROALBUMIN / CREATININE URINE RATIO
Creatinine,U: 44.4 mg/dL
Microalb Creat Ratio: 10 mg/g (ref 0.0–30.0)
Microalb, Ur: 4.5 mg/dL — ABNORMAL HIGH (ref 0.0–1.9)

## 2020-11-08 LAB — HEMOGLOBIN A1C: Hgb A1c MFr Bld: 5.5 % (ref 4.6–6.5)

## 2020-11-08 NOTE — Assessment & Plan Note (Signed)
Repeat lipid panel pending. Not currently on statin therapy given age.

## 2020-11-08 NOTE — Assessment & Plan Note (Addendum)
Borderline too high on exam today, she admits to a poor diet and is motivated to improve.  Continue regular exercise.  Continue nifedipine XL 30 mg

## 2020-11-08 NOTE — Assessment & Plan Note (Addendum)
Following with rheumatology, managed on Enbrel 50 mg every 1 week, and also prednisone during injection days, continue current regimen.

## 2020-11-08 NOTE — Assessment & Plan Note (Signed)
Denies concerns for migraines, continue to monitor. 

## 2020-11-08 NOTE — Assessment & Plan Note (Signed)
Immunizations up-to-date, we will obtain records for her vaccines. Pap smear up-to-date per patient.  Follows with GYN.  Commended her on regular exercise, encouraged her to work on her diet.  Exam today unremarkable. Labs due and pending.

## 2020-11-08 NOTE — Assessment & Plan Note (Signed)
Repeat A1c pending today. Continue Metformin XR 500 mg twice daily, she has a lot of back supply of medication from her GYN and will notify us when she is due for refill.  Urine microalbumin due and pending. She will schedule eye exam. Pneumonia vaccine up-to-date. Lipid panel pending. Foot exam next visit  Follow-up in 6 months.

## 2020-11-08 NOTE — Patient Instructions (Signed)
Stop by the lab prior to leaving today. I will notify you of your results once received.   Start exercising. You should be getting 150 minutes of moderate intensity exercise weekly.  It's important to improve your diet by reducing consumption of fast food, fried food, processed snack foods, sugary drinks. Increase consumption of fresh vegetables and fruits, whole grains, water.  Ensure you are drinking 64 ounces of water daily.  It was a pleasure to see you today!   Preventive Care 69-63 Years Old, Female Preventive care refers to lifestyle choices and visits with your health care provider that can promote health and wellness. This includes:  A yearly physical exam. This is also called an annual wellness visit.  Regular dental and eye exams.  Immunizations.  Screening for certain conditions.  Healthy lifestyle choices, such as: ? Eating a healthy diet. ? Getting regular exercise. ? Not using drugs or products that contain nicotine and tobacco. ? Limiting alcohol use. What can I expect for my preventive care visit? Physical exam Your health care provider may check your:  Height and weight. These may be used to calculate your BMI (body mass index). BMI is a measurement that tells if you are at a healthy weight.  Heart rate and blood pressure.  Body temperature.  Skin for abnormal spots. Counseling Your health care provider may ask you questions about your:  Past medical problems.  Family's medical history.  Alcohol, tobacco, and drug use.  Emotional well-being.  Home life and relationship well-being.  Sexual activity.  Diet, exercise, and sleep habits.  Work and work Statistician.  Access to firearms.  Method of birth control.  Menstrual cycle.  Pregnancy history. What immunizations do I need? Vaccines are usually given at various ages, according to a schedule. Your health care provider will recommend vaccines for you based on your age, medical history,  and lifestyle or other factors, such as travel or where you work.   What tests do I need? Blood tests  Lipid and cholesterol levels. These may be checked every 5 years starting at age 70.  Hepatitis C test.  Hepatitis B test. Screening  Diabetes screening. This is done by checking your blood sugar (glucose) after you have not eaten for a while (fasting).  STD (sexually transmitted disease) testing, if you are at risk.  BRCA-related cancer screening. This may be done if you have a family history of breast, ovarian, tubal, or peritoneal cancers.  Pelvic exam and Pap test. This may be done every 3 years starting at age 62. Starting at age 72, this may be done every 5 years if you have a Pap test in combination with an HPV test. Talk with your health care provider about your test results, treatment options, and if necessary, the need for more tests.   Follow these instructions at home: Eating and drinking  Eat a healthy diet that includes fresh fruits and vegetables, whole grains, lean protein, and low-fat dairy products.  Take vitamin and mineral supplements as recommended by your health care provider.  Do not drink alcohol if: ? Your health care provider tells you not to drink. ? You are pregnant, may be pregnant, or are planning to become pregnant.  If you drink alcohol: ? Limit how much you have to 0-1 drink a day. ? Be aware of how much alcohol is in your drink. In the [place] one drink equals one 12 oz bottle of beer (355 mL), one 5 oz glass of wine (148 mL),  or one 1 oz glass of hard liquor (44 mL).   Lifestyle  Take daily care of your teeth and gums. Brush your teeth every morning and night with fluoride toothpaste. Floss one time each day.  Stay active. Exercise for at least 30 minutes 5 or more days each week.  Do not use any products that contain nicotine or tobacco, such as cigarettes, e-cigarettes, and chewing tobacco. If you need help quitting, ask your health care  provider.  Do not use drugs.  If you are sexually active, practice safe sex. Use a condom or other form of protection to prevent STIs (sexually transmitted infections).  If you do not wish to become pregnant, use a form of birth control. If you plan to become pregnant, see your health care provider for a prepregnancy visit.  Find healthy ways to cope with stress, such as: ? Meditation, yoga, or listening to music. ? Journaling. ? Talking to a trusted person. ? Spending time with friends and family. Safety  Always wear your seat belt while driving or riding in a vehicle.  Do not drive: ? If you have been drinking alcohol. Do not ride with someone who has been drinking. ? When you are tired or distracted. ? While texting.  Wear a helmet and other protective equipment during sports activities.  If you have firearms in your house, make sure you follow all gun safety procedures.  Seek help if you have been physically or sexually abused. What's next?  Go to your health care provider once a year for an annual wellness visit.  Ask your health care provider how often you should have your eyes and teeth checked.  Stay up to date on all vaccines. This information is not intended to replace advice given to you by your health care provider. Make sure you discuss any questions you have with your health care provider. Document Revised: 04/28/2020 Document Reviewed: 05/12/2018 Elsevier Patient Education  2021 Elsevier Inc.  

## 2020-11-08 NOTE — Progress Notes (Signed)
Subjective:    Patient ID: Kelly Gregory, female    DOB: October 08, 1985, 35 y.o.   MRN: [id number]  HPI  This visit occurred during the SARS-CoV-2 public health emergency.  Safety protocols were in place, including screening questions prior to the visit, additional usage of staff PPE, and extensive cleaning of exam room while observing appropriate contact time as indicated for disinfecting solutions.   Kelly Gregory is a 35 year old female who presents today for complete physical.  Immunizations: -Tetanus: 2020 -Influenza: Completed this season  -Pneumonia: 2017 -Covid-19: Completed two doses   Diet: She endorses a fair diet.  Exercise: She is exercising   Eye exam: No recent exam, she plans on scheduling  Dental exam: No recent visit   Pap Smear: UTD  BP Readings from Last 3 Encounters:  11/08/20 132/82  12/06/19 122/78  08/21/19 (!) 113/96     Review of Systems  Constitutional: Negative for unexpected weight change.  HENT: Negative for rhinorrhea.   Eyes: Negative for visual disturbance.  Respiratory: Negative for cough and shortness of breath.   Cardiovascular: Negative for chest pain.  Gastrointestinal: Negative for constipation and diarrhea.  Genitourinary: Negative for difficulty urinating.  Musculoskeletal: Positive for arthralgias.  Skin: Negative for rash.  Allergic/Immunologic: Negative for environmental allergies.  Neurological: Negative for dizziness, numbness and headaches.  Psychiatric/Behavioral: The patient is not nervous/anxious.        Past Medical History:  Diagnosis Date  . Borderline diabetes   . DM (diabetes mellitus) (HCC)   . Elevated blood pressure    no meds  . Gestational diabetes   . Hypertension   . Rheumatoid arthritis (HCC)    Years ago, does not follow, no meds  . Seasonal allergies   . Syncope    As a child, no problems during adulthood.     Social History   Socioeconomic History  . Marital status: Married    Spouse name:  Not on file  . Number of children: Not on file  . Years of education: Not on file  . Highest education level: Not on file  Occupational History  . Not on file  Tobacco Use  . Smoking status: Never Smoker  . Smokeless tobacco: Never Used  Vaping Use  . Vaping Use: Never used  Substance and Sexual Activity  . Alcohol use: No  . Drug use: No  . Sexual activity: Yes    Comment: approx [redacted] wks gestation  Other Topics Concern  . Not on file  Social History Narrative   Married.   2 children.   Works as a Paramedic.   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   Social Determinants of Health   Financial Resource Strain: Not on file  Food Insecurity: Not on file  Transportation Needs: Not on file  Physical Activity: Not on file  Stress: Not on file  Social Connections: Not on file  Intimate Partner Violence: Not on file    Past Surgical History:  Procedure Laterality Date  . CESAREAN SECTION  04/01/2011   Procedure: CESAREAN SECTION;  Surgeon: Oliver Pila;  Location: [facility];  Service: Gynecology;  Laterality: N/A;  . CESAREAN SECTION N/A 05/23/2013   Procedure: CESAREAN SECTION;  Surgeon: Lavina Hamman, MD;  Location: [facility];  Service: Obstetrics;  Laterality: N/A;  . CESAREAN SECTION N/A 03/27/2019   Procedure: CESAREAN SECTION;  Surgeon: Huel Cote, MD;  Location: [facility];  Service: Obstetrics;  Laterality: N/A;  [name],  RNFA  .  CHOLECYSTECTOMY N/A 08/21/2019   Procedure: LAPAROSCOPIC CHOLECYSTECTOMY with IOC;  Surgeon: Manus Rudd, MD;  Location: [facility];  Service: General;  Laterality: N/A;  . CYSTECTOMY Left    removed from underarm   . DILATION AND EVACUATION N/A 02/01/2018   Procedure: DILATATION AND EVACUATION;  Surgeon: Huel Cote, MD;  Location: [facility];  Service: Gynecology;  Laterality: N/A;  . TOOTH EXTRACTION     wisdom teeth ext    Family History  Adopted: Yes  Problem Relation Age of Onset  . Other Other        Adopted     Allergies  Allergen Reactions  . Azithromycin Nausea And Vomiting  . Gatifloxacin Nausea And Vomiting and Other (See Comments)  . Tequin Nausea And Vomiting  . Penicillins Hives, Rash and Other (See Comments)    Has patient had a PCN reaction causing immediate rash, facial/tongue/throat swelling, SOB or lightheadedness with hypotension: Unknown Has patient had a PCN reaction causing severe rash involving mucus membranes or skin necrosis: Unknown Has patient had a PCN reaction that required hospitalization: Unknown Has patient had a PCN reaction occurring within the last 10 years: No If all of the above answers are "NO", then may proceed with Cephalosporin use.     Current Outpatient Medications on File Prior to Visit  Medication Sig Dispense Refill  . etanercept (ENBREL) 50 MG/ML injection Inject 50 mg into the skin every 14 (fourteen) days.    . metFORMIN (GLUCOPHAGE-XR) 500 MG 24 hr tablet Take 1 tablet (500 mg total) by mouth 2 (two) times a day. 180 tablet 3  . NIFEdipine (PROCARDIA-XL/NIFEDICAL-XL) 30 MG 24 hr tablet TAKE 1 TABLET BY MOUTH DAILY FOR BLOOD PRESSURE 90 tablet 0  . predniSONE (DELTASONE) 5 MG tablet Take 5 mg by mouth. Take 1-2 tablets by mouth as needed with Enbrel injection.     No current facility-administered medications on file prior to visit.    BP 132/82   Pulse 81   Temp 97.6 F (36.4 C) (Temporal)   Ht 4\' 9"  (1.448 m)   Wt 153 lb (69.4 kg)   SpO2 98%   BMI 33.11 kg/m    Objective:   Physical Exam Constitutional:      Appearance: She is well-nourished.  HENT:     Right Ear: Tympanic membrane and ear canal normal.     Left Ear: Tympanic membrane and ear canal normal.     Mouth/Throat:     Mouth: Oropharynx is clear and moist.  Eyes:     Extraocular Movements: EOM normal.     Pupils: Pupils are equal, round, and reactive to light.  Cardiovascular:     Rate and Rhythm: Normal rate and regular rhythm.  Pulmonary:     Effort: Pulmonary  effort is normal.     Breath sounds: Normal breath sounds.  Abdominal:     General: Bowel sounds are normal.     Palpations: Abdomen is soft.     Tenderness: There is no abdominal tenderness.  Musculoskeletal:        General: Normal range of motion.     Cervical back: Neck supple.  Skin:    General: Skin is warm and dry.  Neurological:     Mental Status: She is alert and oriented to person, place, and time.     Cranial Nerves: No cranial nerve deficit.     Deep Tendon Reflexes:     Reflex Scores:      Patellar reflexes are 2+ on  the right side and 2+ on the left side. Psychiatric:        Mood and Affect: Mood and affect and mood normal.            Assessment & Plan:

## 2020-11-11 LAB — HEPATITIS C ANTIBODY
Hepatitis C Ab: NONREACTIVE
SIGNAL TO CUT-OFF: 0.01 (ref <1.00)

## 2020-12-03 ENCOUNTER — Other Ambulatory Visit: Payer: Self-pay | Admitting: Primary Care

## 2020-12-03 DIAGNOSIS — I10 Essential (primary) hypertension: Secondary | ICD-10-CM

## 2021-01-17 ENCOUNTER — Other Ambulatory Visit: Payer: Self-pay | Admitting: Primary Care

## 2021-01-17 DIAGNOSIS — E119 Type 2 diabetes mellitus without complications: Secondary | ICD-10-CM

## 2021-01-20 NOTE — Telephone Encounter (Signed)
Not given by you in past ok to refill?  

## 2021-05-09 ENCOUNTER — Ambulatory Visit (INDEPENDENT_AMBULATORY_CARE_PROVIDER_SITE_OTHER): Payer: 59 | Admitting: Primary Care

## 2021-05-09 ENCOUNTER — Encounter: Payer: Self-pay | Admitting: Primary Care

## 2021-05-09 ENCOUNTER — Other Ambulatory Visit: Payer: Self-pay

## 2021-05-09 VITALS — BP 124/68 | HR 91 | Temp 98.1°F | Ht <= 58 in | Wt 154.0 lb

## 2021-05-09 DIAGNOSIS — Z23 Encounter for immunization: Secondary | ICD-10-CM | POA: Diagnosis not present

## 2021-05-09 DIAGNOSIS — E785 Hyperlipidemia, unspecified: Secondary | ICD-10-CM | POA: Diagnosis not present

## 2021-05-09 DIAGNOSIS — E119 Type 2 diabetes mellitus without complications: Secondary | ICD-10-CM

## 2021-05-09 LAB — LIPID PANEL
Cholesterol: 240 mg/dL — ABNORMAL HIGH (ref 0–200)
HDL: 46 mg/dL (ref >39.00)
NonHDL: 193.76
Total CHOL/HDL Ratio: 5
Triglycerides: 296 mg/dL — ABNORMAL HIGH (ref 0.0–149.0)
VLDL: 59.2 mg/dL — ABNORMAL HIGH (ref 0.0–40.0)

## 2021-05-09 LAB — LDL CHOLESTEROL, DIRECT: Direct LDL: 174 mg/dL

## 2021-05-09 LAB — HEMOGLOBIN A1C: Hgb A1c MFr Bld: 5.7 % (ref 4.6–6.5)

## 2021-05-09 NOTE — Assessment & Plan Note (Signed)
Compliant to metformin XR 500 mg BID. A1C has been under excellent control over the years.  Consider reducing Metformin XR to 500 mg vs discontinuation, especially given her regular exercise.  Await A1C. Foot exam today. Repeat lipids pending. Urine microalbumin UTD.  Follow up in 6 months.

## 2021-05-09 NOTE — Patient Instructions (Addendum)
Stop by the lab prior to leaving today. I will notify you of your results once received.   Please schedule a physical to meet with me in 6 months.   It was a pleasure to see you today!        Influenza (Flu) Vaccine (Inactivated or Recombinant): What You Need to Know 1. Why get vaccinated? Influenza vaccine can prevent influenza (flu). Flu is a contagious disease that spreads around the [place] every year, usually between October and May. Anyone can get the flu, but it is more dangerous for some people. Infants and young children, people 105 years and older, pregnant people, and people with certain health conditions or a weakenedimmune system are at greatest risk of flu complications. Pneumonia, bronchitis, sinus infections, and ear infections are examples of flu-related complications. If you have a medical condition, such as heartdisease, cancer, or diabetes, flu can make it worse. Flu can cause fever and chills, sore throat, muscle aches, fatigue, cough, headache, and runny or stuffy nose. Some people may have vomiting and diarrhea,though this is more common in children than adults. In an average year, thousands of people in the [place] die from flu, and many more are hospitalized. Flu vaccine prevents millions of illnessesand flu-related visits to the doctor each year. 2. Influenza vaccines CDC recommends everyone 6 months and older get vaccinated every flu season. Children 6 months through 59 years of age may need 2 doses during a single flu season. Everyone else needs only 1 dose each flu season. It takes about 2 weeks for protection to develop after vaccination. There are many flu viruses, and they are always changing. Each year a new flu vaccine is made to protect against the influenza viruses believed to be likely to cause disease in the upcoming flu season. Even when the vaccine doesn'texactly match these viruses, it may still provide some protection. Influenza vaccine does  not cause flu. Influenza vaccine may be given at the same time as other vaccines. 3. Talk with your health care provider Tell your vaccination provider if the person getting the vaccine: Has had an allergic reaction after a previous dose of influenza vaccine, or has any severe, life-threatening allergies Has ever had Guillain-Barr Syndrome (also called "GBS") In some cases, your health care provider may decide to postpone influenzavaccination until a future visit. Influenza vaccine can be administered at any time during pregnancy. People who are or will be pregnant during influenza season should receive inactivatedinfluenza vaccine. People with minor illnesses, such as a cold, may be vaccinated. People who are moderately or severely ill should usually wait until they recover beforegetting influenza vaccine. Your health care provider can give you more information. 4. Risks of a vaccine reaction Soreness, redness, and swelling where the shot is given, fever, muscle aches, and headache can happen after influenza vaccination. There may be a very small increased risk of Guillain-Barr Syndrome (GBS) after inactivated influenza vaccine (the flu shot). Young children who get the flu shot along with pneumococcal vaccine (PCV13) and/or DTaP vaccine at the same time might be slightly more likely to have a seizure caused by fever. Tell your health care provider if a child who isgetting flu vaccine has ever had a seizure. People sometimes faint after medical procedures, including vaccination. Tellyour provider if you feel dizzy or have vision changes or ringing in the ears. As with any medicine, there is a very remote chance of a vaccine causing asevere allergic reaction, other serious injury, or death. 5. What  if there is a serious problem? An allergic reaction could occur after the vaccinated person leaves the clinic. If you see signs of a severe allergic reaction (hives, swelling of the face and throat,  difficulty breathing, a fast heartbeat, dizziness, or weakness), call 9-1-1 and get the person to the nearest hospital. For other signs that concern you, call your health care provider. Adverse reactions should be reported to the Vaccine Adverse Event Reporting System (VAERS). Your health care provider will usually file this report, or you can do it yourself. Visit the VAERS website at www.vaers.LAgents.no or call 864-350-4169. VAERS is only for reporting reactions, and VAERS staff members do not give medical advice. 6. The National Vaccine Injury Compensation Program The [employer] Vaccine Injury Compensation Program (VICP) is a federal program that was created to compensate people who may have been injured by certain vaccines. Claims regarding alleged injury or death due to vaccination have a time limit for filing, which may be as short as two years. Visit the VICP website at SpiritualWord.at or call 702-022-9856 to learn about the program and about filing a claim. 7. How can I learn more? Ask your health care provider. Call your local or state health department. Visit the website of the Food and Drug Administration (FDA) for vaccine package inserts and additional information at FinderList.no. Contact the Centers for Disease Control and Prevention (CDC): Call (773)679-6660 (1-800-CDC-INFO) or Visit CDC's website at BiotechRoom.com.cy. Vaccine Information Statement Inactivated Influenza Vaccine (04/19/2020) This information is not intended to replace advice given to you by your health care provider. Make sure you discuss any questions you have with your healthcare provider. Document Revised: 06/06/2020 Document Reviewed: 06/06/2020 Elsevier Patient Education  2022 [employer].

## 2021-05-09 NOTE — Assessment & Plan Note (Signed)
Not on statin therapy. Repeat lipid panel pending.  Commended her on her regular exercise.

## 2021-05-09 NOTE — Progress Notes (Signed)
Subjective:    Patient ID: Kelly Gregory, female    DOB: [birth year], 35 y.o.   MRN: [id number]  HPI  Kelly Gregory is a very pleasant 35 y.o. female with a history of type 2 diabetes, elevated LFT's, Rheumatoid arthritis, hypertension who presents today for follow up of diabetes and hyperlipidemia.  Current medications include: Metformin XR 1000 mg in the AM.   Last A1C: 5.5 in February 2022,  Last Eye Exam:  Due Last Foot Exam: Completed today Pneumonia Vaccination: 2017 Urine Microalbumin: Negative in February 2022 Statin: None  Dietary changes since last visit: None.   Exercise: She is training for a marathon.   BP Readings from Last 3 Encounters:  05/09/21 124/68  11/08/20 132/82  12/06/19 122/78   Wt Readings from Last 3 Encounters:  05/09/21 154 lb (69.9 kg)  11/08/20 153 lb (69.4 kg)  12/06/19 152 lb (68.9 kg)      Review of Systems  Eyes:  Negative for visual disturbance.  Respiratory:  Negative for shortness of breath.   Cardiovascular:  Negative for chest pain.  Neurological:  Negative for headaches.        Past Medical History:  Diagnosis Date   Borderline diabetes    Cholelithiasis with acute cholecystitis 08/21/2019   DM (diabetes mellitus) (HCC)    Elevated blood pressure    no meds   Gestational diabetes    Hypertension    Rheumatoid arthritis (HCC)    Years ago, does not follow, no meds   S/P repeat low transverse C-section 03/27/2019   Seasonal allergies    Syncope    As a child, no problems during adulthood.    Social History   Socioeconomic History   Marital status: Married    Spouse name: Not on file   Number of children: Not on file   Years of education: Not on file   Highest education level: Not on file  Occupational History   Not on file  Tobacco Use   Smoking status: Never   Smokeless tobacco: Never  Vaping Use   Vaping Use: Never used  Substance and Sexual Activity   Alcohol use: No   Drug use: No   Sexual  activity: Yes    Comment: approx [redacted] wks gestation  Other Topics Concern   Not on file  Social History Narrative   Married.   2 children.   Works as a Paramedic.   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   Social Determinants of Health   Financial Resource Strain: Not on file  Food Insecurity: Not on file  Transportation Needs: Not on file  Physical Activity: Not on file  Stress: Not on file  Social Connections: Not on file  Intimate Partner Violence: Not on file    Past Surgical History:  Procedure Laterality Date   CESAREAN SECTION  04/01/2011   Procedure: CESAREAN SECTION;  Surgeon: Oliver Pila;  Location: [facility];  Service: Gynecology;  Laterality: N/A;   CESAREAN SECTION N/A 05/23/2013   Procedure: CESAREAN SECTION;  Surgeon: Lavina Hamman, MD;  Location: [facility];  Service: Obstetrics;  Laterality: N/A;   CESAREAN SECTION N/A 03/27/2019   Procedure: CESAREAN SECTION;  Surgeon: Huel Cote, MD;  Location: [facility];  Service: Obstetrics;  Laterality: N/AHerbert Seta,  RNFA   CHOLECYSTECTOMY N/A 08/21/2019   Procedure: LAPAROSCOPIC CHOLECYSTECTOMY with IOC;  Surgeon: Manus Rudd, MD;  Location: [facility];  Service: General;  Laterality: N/A;   CYSTECTOMY Left  removed from underarm    DILATION AND EVACUATION N/A 02/01/2018   Procedure: DILATATION AND EVACUATION;  Surgeon: Huel Cote, MD;  Location: [facility];  Service: Gynecology;  Laterality: N/A;   TOOTH EXTRACTION     wisdom teeth ext    Family History  Adopted: Yes  Problem Relation Age of Onset   Other Other        Adopted    Allergies  Allergen Reactions   Azithromycin Nausea And Vomiting   Gatifloxacin Nausea And Vomiting and Other (See Comments)   Tequin Nausea And Vomiting   Penicillins Hives, Rash and Other (See Comments)    Has patient had a PCN reaction causing immediate rash, facial/tongue/throat swelling, SOB or lightheadedness with hypotension: Unknown Has patient had a PCN  reaction causing severe rash involving mucus membranes or skin necrosis: Unknown Has patient had a PCN reaction that required hospitalization: Unknown Has patient had a PCN reaction occurring within the last 10 years: No If all of the above answers are "NO", then may proceed with Cephalosporin use.     Current Outpatient Medications on File Prior to Visit  Medication Sig Dispense Refill   etanercept (ENBREL) 50 MG/ML injection Inject 50 mg into the skin every 14 (fourteen) days.     metFORMIN (GLUCOPHAGE-XR) 500 MG 24 hr tablet Take 1 tablet (500 mg total) by mouth 2 (two) times daily. For diabetes. 180 tablet 2   NIFEdipine (PROCARDIA-XL/NIFEDICAL-XL) 30 MG 24 hr tablet TAKE 1 TABLET BY MOUTH EVERY DAY FOR BLOOD PRESSURE 90 tablet 3   predniSONE (DELTASONE) 5 MG tablet Take 5 mg by mouth. Take 1-2 tablets by mouth as needed with Enbrel injection.     No current facility-administered medications on file prior to visit.    BP 124/68   Pulse 91   Temp 98.1 F (36.7 C) (Temporal)   Ht 4\' 9"  (1.448 m)   Wt 154 lb (69.9 kg)   SpO2 100%   BMI 33.33 kg/m  Objective:   Physical Exam Cardiovascular:     Rate and Rhythm: Normal rate and regular rhythm.  Pulmonary:     Effort: Pulmonary effort is normal.     Breath sounds: Normal breath sounds.  Musculoskeletal:     Cervical back: Neck supple.  Skin:    General: Skin is warm and dry.          Assessment & Plan:      This visit occurred during the SARS-CoV-2 public health emergency.  Safety protocols were in place, including screening questions prior to the visit, additional usage of staff PPE, and extensive cleaning of exam room while observing appropriate contact time as indicated for disinfecting solutions.

## 2021-05-15 LAB — HM DIABETES EYE EXAM

## 2021-05-20 ENCOUNTER — Encounter: Payer: Self-pay | Admitting: Primary Care

## 2021-05-23 DIAGNOSIS — E785 Hyperlipidemia, unspecified: Secondary | ICD-10-CM

## 2021-05-23 MED ORDER — ATORVASTATIN CALCIUM 20 MG PO TABS: 20.0000 mg | ORAL_TABLET | Freq: Every day | ORAL | 3 refills | Status: DC

## 2021-05-26 ENCOUNTER — Encounter: Payer: Self-pay | Admitting: Primary Care

## 2021-10-25 ENCOUNTER — Other Ambulatory Visit: Payer: Self-pay | Admitting: Primary Care

## 2021-10-25 DIAGNOSIS — E119 Type 2 diabetes mellitus without complications: Secondary | ICD-10-CM

## 2021-11-05 ENCOUNTER — Ambulatory Visit (INDEPENDENT_AMBULATORY_CARE_PROVIDER_SITE_OTHER): Payer: 59

## 2021-11-05 ENCOUNTER — Ambulatory Visit
Admission: RE | Admit: 2021-11-05 | Discharge: 2021-11-05 | Disposition: A | Discharge status: Home / Self Care | Payer: 59 | Source: Ambulatory Visit | Attending: Urgent Care | Admitting: Urgent Care

## 2021-11-05 VITALS — BP 144/94 | HR 92 | Temp 98.8°F | Resp 18

## 2021-11-05 DIAGNOSIS — M25571 Pain in right ankle and joints of right foot: Secondary | ICD-10-CM | POA: Diagnosis not present

## 2021-11-05 DIAGNOSIS — M25471 Effusion, right ankle: Secondary | ICD-10-CM | POA: Diagnosis not present

## 2021-11-05 DIAGNOSIS — S82891A Other fracture of right lower leg, initial encounter for closed fracture: Secondary | ICD-10-CM

## 2021-11-05 MED ORDER — NAPROXEN 375 MG PO TABS: 375.0000 mg | ORAL_TABLET | Freq: Two times a day (BID) | ORAL | 0 refills | Status: DC

## 2021-11-05 MED ORDER — HYDROCODONE-ACETAMINOPHEN 5-325 MG PO TABS: 1.0000 | ORAL_TABLET | Freq: Four times a day (QID) | ORAL | 0 refills | Status: DC | PRN

## 2021-11-05 NOTE — ED Provider Notes (Signed)
Kelly Gregory   MRN: [id number] DOB: 1985/12/18  Subjective:   Kelly Gregory is a 36 y.o. female presenting for 1 day history of persistent right ankle pain and swelling, difficulty bearing weight.  Patient was running and rolled it laterally, causing the injury.   No current facility-administered medications for this encounter.  Current Outpatient Medications:    atorvastatin (LIPITOR) 20 MG tablet, Take 1 tablet (20 mg total) by mouth daily. For cholesterol., Disp: 90 tablet, Rfl: 3   etanercept (ENBREL) 50 MG/ML injection, Inject 50 mg into the skin every 14 (fourteen) days., Disp: , Rfl:    metFORMIN (GLUCOPHAGE-XR) 500 MG 24 hr tablet, Take 1 tablet (500 mg total) by mouth daily with breakfast. For diabetes., Disp: 90 tablet, Rfl: 0   NIFEdipine (PROCARDIA-XL/NIFEDICAL-XL) 30 MG 24 hr tablet, TAKE 1 TABLET BY MOUTH EVERY DAY FOR BLOOD PRESSURE, Disp: 90 tablet, Rfl: 3   predniSONE (DELTASONE) 5 MG tablet, Take 5 mg by mouth. Take 1-2 tablets by mouth as needed with Enbrel injection., Disp: , Rfl:    Allergies  Allergen Reactions   Azithromycin Nausea And Vomiting   Gatifloxacin Nausea And Vomiting and Other (See Comments)   Tequin Nausea And Vomiting   Penicillins Hives, Rash and Other (See Comments)    Has patient had a PCN reaction causing immediate rash, facial/tongue/throat swelling, SOB or lightheadedness with hypotension: Unknown Has patient had a PCN reaction causing severe rash involving mucus membranes or skin necrosis: Unknown Has patient had a PCN reaction that required hospitalization: Unknown Has patient had a PCN reaction occurring within the last 10 years: No If all of the above answers are "NO", then may proceed with Cephalosporin use.     Past Medical History:  Diagnosis Date   Borderline diabetes    Cholelithiasis with acute cholecystitis 08/21/2019   DM (diabetes mellitus) (HCC)    Elevated blood pressure    no meds   Gestational diabetes     Hypertension    Rheumatoid arthritis (HCC)    Years ago, does not follow, no meds   S/P repeat low transverse C-section 03/27/2019   Seasonal allergies    Syncope    As a child, no problems during adulthood.     Past Surgical History:  Procedure Laterality Date   CESAREAN SECTION  04/01/2011   Procedure: CESAREAN SECTION;  Surgeon: Oliver Pila;  Location: [facility];  Service: Gynecology;  Laterality: N/A;   CESAREAN SECTION N/A 05/23/2013   Procedure: CESAREAN SECTION;  Surgeon: Lavina Hamman, MD;  Location: [facility];  Service: Obstetrics;  Laterality: N/A;   CESAREAN SECTION N/A 03/27/2019   Procedure: CESAREAN SECTION;  Surgeon: Huel Cote, MD;  Location: [facility];  Service: Obstetrics;  Laterality: N/AHerbert Seta,  RNFA   CHOLECYSTECTOMY N/A 08/21/2019   Procedure: LAPAROSCOPIC CHOLECYSTECTOMY with IOC;  Surgeon: Manus Rudd, MD;  Location: [facility];  Service: General;  Laterality: N/A;   CYSTECTOMY Left    removed from underarm    DILATION AND EVACUATION N/A 02/01/2018   Procedure: DILATATION AND EVACUATION;  Surgeon: Huel Cote, MD;  Location: [facility];  Service: Gynecology;  Laterality: N/A;   TOOTH EXTRACTION     wisdom teeth ext    Family History  Adopted: Yes  Problem Relation Age of Onset   Other Other        Adopted    Social History   Tobacco Use   Smoking status: Never   Smokeless tobacco: Never  Vaping  Use   Vaping Use: Never used  Substance Use Topics   Alcohol use: No   Drug use: No    ROS   Objective:   Vitals: BP (!) 144/94 (BP Location: Left Arm)    Pulse 92    Temp 98.8 F (37.1 C) (Oral)    Resp 18    LMP  (LMP Unknown)    SpO2 98%   Physical Exam Constitutional:      General: She is not in acute distress.    Appearance: Normal appearance. She is well-developed. She is not ill-appearing, toxic-appearing or diaphoretic.  HENT:     Head: Normocephalic and atraumatic.     Nose: Nose normal.     Mouth/Throat:     Mouth: Mucous  membranes are moist.  Eyes:     General: No scleral icterus.       Right eye: No discharge.        Left eye: No discharge.     Extraocular Movements: Extraocular movements intact.  Cardiovascular:     Rate and Rhythm: Normal rate.  Pulmonary:     Effort: Pulmonary effort is normal.  Musculoskeletal:     Right ankle: Swelling and ecchymosis present. No deformity or lacerations. Tenderness present over the lateral malleolus, ATF ligament and AITF ligament. No medial malleolus, CF ligament, posterior TF ligament, base of 5th metatarsal or proximal fibula tenderness. Decreased range of motion.     Right Achilles Tendon: No tenderness or defects. Thompson's test negative.  Skin:    General: Skin is warm and dry.  Neurological:     General: No focal deficit present.     Mental Status: She is alert and oriented to person, place, and time.  Psychiatric:        Mood and Affect: Mood normal.        Behavior: Behavior normal.    DG Ankle Complete Right  Result Date: 11/05/2021 CLINICAL DATA:  Pain after trauma. EXAM: RIGHT ANKLE - COMPLETE 3+ VIEW COMPARISON:  08/27/2016 FINDINGS: Moderate lateral malleolar soft tissue swelling. Transverse distal fibular fracture may have minimal comminution. Base of fifth metatarsal and talar dome intact. IMPRESSION: Distal fibular acute fracture with moderate overlying soft tissue swelling. Electronically Signed   By: Abigail Miyamoto M.D.   On: 11/05/2021 13:39    I fabricated a 4 inch posterior lower leg splint that extended from the proximal leg to just past the toes.  Secured with two 4 inch Ace wraps.  Assessment and Plan :   PDMP not reviewed this encounter.  1. Closed fracture of right ankle, initial encounter   2. Pain and swelling of right ankle    Patient is to wear the posterior short leg splint at all times, use crutches to ambulate, emphasized need to be nonweightbearing.  Naproxen for pain control, hydrocodone for breakthrough pain.  Follow-up  with Dr. Sammuel Hines asap for long-term management of her right ankle fracture. Counseled patient on potential for adverse effects with medications prescribed/recommended today, ER and return-to-clinic precautions discussed, patient verbalized understanding.    Jaynee Eagles, PA-C 11/05/21 1407

## 2021-11-05 NOTE — ED Triage Notes (Signed)
Pt fell running yesterday and injured her right ankle.

## 2021-11-05 NOTE — Discharge Instructions (Signed)
Make sure you wear the splint at all times.  Do not put any weight on your ankle.  Follow-up with Dr. Steward Drone to get a consultation regarding how they were going to manage your ankle fracture long-term.  Please schedule naproxen twice daily with food for your severe pain.  If you still have pain despite taking naproxen regularly, this is breakthrough pain.  You can use hydrocodone, a narcotic pain medicine, once every 4-6 hours for this.  Once your pain is better controlled, switch back to just naproxen.

## 2021-11-06 ENCOUNTER — Ambulatory Visit (HOSPITAL_BASED_OUTPATIENT_CLINIC_OR_DEPARTMENT_OTHER): Payer: 59 | Admitting: Orthopaedic Surgery

## 2021-11-12 ENCOUNTER — Encounter: Payer: 59 | Admitting: Primary Care

## 2021-11-12 ENCOUNTER — Ambulatory Visit (HOSPITAL_BASED_OUTPATIENT_CLINIC_OR_DEPARTMENT_OTHER): Payer: 59 | Admitting: Orthopaedic Surgery

## 2021-11-12 ENCOUNTER — Other Ambulatory Visit: Payer: Self-pay

## 2021-11-12 DIAGNOSIS — S82831A Other fracture of upper and lower end of right fibula, initial encounter for closed fracture: Secondary | ICD-10-CM | POA: Diagnosis not present

## 2021-11-12 NOTE — Progress Notes (Signed)
? ?                            ? ? ?Chief Complaint: Right ankle pain ?  ? ? ?History of Present Illness:  ? ? ?Kelly Gregory is a 36 y.o. female presents with right ankle pain and fracture after a fall and tripped over a branch while running on 22 February.  Since that time she had gone to the emergency room and was placed in a splint and made nonweightbearing.  She is experiencing swelling and pain on the lateral aspect of the ankle.  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  She has been using crutches.  She is taking anti-inflammatories for pain.  She is icing as well. ? ? ? ?Surgical History:   ?None ? ?PMH/PSH/Family History/Social History/Meds/Allergies:   ? ?Past Medical History:  ?Diagnosis Date  ? Borderline diabetes   ? Cholelithiasis with acute cholecystitis 08/21/2019  ? DM (diabetes mellitus) (HCC)   ? Elevated blood pressure   ? no meds  ? Gestational diabetes   ? Hypertension   ? Rheumatoid arthritis (HCC)   ? Years ago, does not follow, no meds  ? S/P repeat low transverse C-section 03/27/2019  ? Seasonal allergies   ? Syncope   ? As a child, no problems during adulthood.  ? ?Past Surgical History:  ?Procedure Laterality Date  ? CESAREAN SECTION  04/01/2011  ? Procedure: CESAREAN SECTION;  Surgeon: Oliver PilaKathy W Richardson;  Location: [facility];  Service: Gynecology;  Laterality: N/A;  ? CESAREAN SECTION N/A 05/23/2013  ? Procedure: CESAREAN SECTION;  Surgeon: Lavina Hammanodd Meisinger, MD;  Location: [facility];  Service: Obstetrics;  Laterality: N/A;  ? CESAREAN SECTION N/A 03/27/2019  ? Procedure: CESAREAN SECTION;  Surgeon: Huel Coteichardson, Kathy, MD;  [facility];  Service: Obstetrics;  Laterality: N/AHerbert Seta;  [name],  RNFA  ? CHOLECYSTECTOMY N/A 08/21/2019  ? Procedure: LAPAROSCOPIC CHOLECYSTECTOMY with IOC;  Surgeon: Manus Ruddsuei, Matthew, MD;  Location: [facility];  Service: General;  Laterality: N/A;  ? CYSTECTOMY Left   ? removed from underarm   ? DILATION AND  EVACUATION N/A 02/01/2018  ? Procedure: DILATATION AND EVACUATION;  Surgeon: Huel Coteichardson, Kathy, MD;  Location: [facility];  Service: Gynecology;  Laterality: N/A;  ? TOOTH EXTRACTION    ? wisdom teeth ext  ? ?Social History  ? ?Socioeconomic History  ? Marital status: Married  ?  Spouse name: Not on file  ? Number of children: Not on file  ? Years of education: Not on file  ? Highest education level: Not on file  ?Occupational History  ? Not on file  ?Tobacco Use  ? Smoking status: Never  ? Smokeless tobacco: Never  ?Vaping Use  ? Vaping Use: Never used  ?Substance and Sexual Activity  ? Alcohol use: No  ? Drug use: No  ? Sexual activity: Yes  ?  Comment: approx [redacted] wks gestation  ?Other Topics Concern  ? Not on file  ?Social History Narrative  ? Married.  ? 2 children.  ? Works as a Paramedictherapist.  ? 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  ? ?Social Determinants of Health  ? ?Financial Resource Strain: Not on file  ?Food Insecurity: Not on file  ?Transportation Needs: Not on file  ?Physical Activity: Not on file  ?Stress: Not on file  ?Social Connections: Not on file  ? ?Family History  ?  Adopted: Yes  ?Problem Relation Age of Onset  ? Other Other   ?     Adopted  ? ?Allergies  ?Allergen Reactions  ? Azithromycin Nausea And Vomiting  ? Gatifloxacin Nausea And Vomiting and Other (See Comments)  ? Tequin Nausea And Vomiting  ? Penicillins Hives, Rash and Other (See Comments)  ?  Has patient had a PCN reaction causing immediate rash, facial/tongue/throat swelling, SOB or lightheadedness with hypotension: Unknown ?Has patient had a PCN reaction causing severe rash involving mucus membranes or skin necrosis: Unknown ?Has patient had a PCN reaction that required hospitalization: Unknown ?Has patient had a PCN reaction occurring within the last 10 years: No ?If all of the above answers are "NO", then may proceed with Cephalosporin use. ?  ? ?Current Outpatient Medications  ?Medication Sig Dispense Refill  ? atorvastatin  (LIPITOR) 20 MG tablet Take 1 tablet (20 mg total) by mouth daily. For cholesterol. 90 tablet 3  ? etanercept (ENBREL) 50 MG/ML injection Inject 50 mg into the skin every 14 (fourteen) days.    ? HYDROcodone-acetaminophen (NORCO/VICODIN) 5-325 MG tablet Take 1 tablet by mouth every 6 (six) hours as needed for severe pain. 15 tablet 0  ? metFORMIN (GLUCOPHAGE-XR) 500 MG 24 hr tablet Take 1 tablet (500 mg total) by mouth daily with breakfast. For diabetes. 90 tablet 0  ? naproxen (NAPROSYN) 375 MG tablet Take 1 tablet (375 mg total) by mouth 2 (two) times daily with a meal. 30 tablet 0  ? NIFEdipine (PROCARDIA-XL/NIFEDICAL-XL) 30 MG 24 hr tablet TAKE 1 TABLET BY MOUTH EVERY DAY FOR BLOOD PRESSURE 90 tablet 3  ? predniSONE (DELTASONE) 5 MG tablet Take 5 mg by mouth. Take 1-2 tablets by mouth as needed with Enbrel injection.    ? ?No current facility-administered medications for this visit.  ? ?No results found. ? ?Review of Systems:   ?A ROS was performed including pertinent positives and negatives as documented in the HPI. ? ?Physical Exam :   ?Constitutional: NAD and appears stated age ?Neurological: Alert and oriented ?Psych: Appropriate affect and cooperative ?unknown if currently breastfeeding.  ? ?Comprehensive Musculoskeletal Exam:   ? ?Tenderness palpation about the right distal fibula.  Stress examination deferred today.  No tenderness about the medial malleolus.  She is able to dorsiflex to 10 degrees plantar flex 20 degrees.  Sensation is intact in all distributions of the right foot.  2+ dorsalis pedis pulse.  There is some bruising about the lateral aspect of the ankle ? ?Imaging:   ?Xray (3 views right ankle): ?Nondisplaced distal fibula avulsion fracture with a congruent ankle mortise ? ?I personally reviewed and interpreted the radiographs. ? ? ?Assessment:   ?36 year old female with a right distal fibula tip avulsion fracture.  I did describe that this is more consistent with a ligamentous avulsion of  the anterior talofibular ligament.  I would like to transition her to a cam boot and advance her weightbearing to as tolerated.  I advised that she can wean off of her crutches.  I will plan to see her back in 4 weeks for reassessment. ? ?Plan :   ? ?-Return to clinic in 4 weeks. ? ? ? ? ?I personally saw and evaluated the patient, and participated in the management and treatment plan. ? ?Huel Cote, MD ?Attending Physician, Orthopedic Surgery ? ?This document was dictated using Conservation officer, historic buildings. A reasonable attempt at proof reading has been made to minimize errors. ?

## 2021-11-23 ENCOUNTER — Other Ambulatory Visit: Payer: Self-pay | Admitting: Primary Care

## 2021-11-23 DIAGNOSIS — I10 Essential (primary) hypertension: Secondary | ICD-10-CM

## 2021-11-26 ENCOUNTER — Ambulatory Visit (INDEPENDENT_AMBULATORY_CARE_PROVIDER_SITE_OTHER): Payer: 59 | Admitting: Primary Care

## 2021-11-26 ENCOUNTER — Other Ambulatory Visit: Payer: Self-pay

## 2021-11-26 ENCOUNTER — Encounter: Payer: Self-pay | Admitting: Primary Care

## 2021-11-26 VITALS — BP 132/88 | HR 91 | Ht <= 58 in | Wt 164.0 lb

## 2021-11-26 DIAGNOSIS — G43909 Migraine, unspecified, not intractable, without status migrainosus: Secondary | ICD-10-CM

## 2021-11-26 DIAGNOSIS — S82831A Other fracture of upper and lower end of right fibula, initial encounter for closed fracture: Secondary | ICD-10-CM | POA: Insufficient documentation

## 2021-11-26 DIAGNOSIS — E785 Hyperlipidemia, unspecified: Secondary | ICD-10-CM | POA: Diagnosis not present

## 2021-11-26 DIAGNOSIS — S82831S Other fracture of upper and lower end of right fibula, sequela: Secondary | ICD-10-CM

## 2021-11-26 DIAGNOSIS — I10 Essential (primary) hypertension: Secondary | ICD-10-CM

## 2021-11-26 DIAGNOSIS — E119 Type 2 diabetes mellitus without complications: Secondary | ICD-10-CM

## 2021-11-26 DIAGNOSIS — Z0000 Encounter for general adult medical examination without abnormal findings: Secondary | ICD-10-CM | POA: Diagnosis not present

## 2021-11-26 DIAGNOSIS — M069 Rheumatoid arthritis, unspecified: Secondary | ICD-10-CM

## 2021-11-26 DIAGNOSIS — N3946 Mixed incontinence: Secondary | ICD-10-CM

## 2021-11-26 HISTORY — DX: Other fracture of upper and lower end of right fibula, initial encounter for closed fracture: S82.831A

## 2021-11-26 LAB — POCT GLYCOSYLATED HEMOGLOBIN (HGB A1C): Hemoglobin A1C: 6 % — AB (ref 4.0–5.6)

## 2021-11-26 MED ORDER — ROSUVASTATIN CALCIUM 5 MG PO TABS: 5.0000 mg | ORAL_TABLET | Freq: Every day | ORAL | 0 refills | Status: DC

## 2021-11-26 NOTE — Assessment & Plan Note (Signed)
Immunizations UTD. °Pap smear UTD, follows with GYN. ° °Discussed the importance of a healthy diet and regular exercise in order for weight loss, and to reduce the risk of further co-morbidity. ° °Exam today stable. °Labs pending. °

## 2021-11-26 NOTE — Assessment & Plan Note (Signed)
Intolerant to atorvastatin. ? ?Trial of Crestor 5 mg sent to pharmacy. ?She will update if this causes myalgias. ? ?Repeat lipid panel pending. ?

## 2021-11-26 NOTE — Assessment & Plan Note (Signed)
Controlled.  ? ?Continue nifedipine XL 30 mg. ? ?

## 2021-11-26 NOTE — Assessment & Plan Note (Signed)
Resolved with pelvic floor PT. ?Continue to monitor.  ?

## 2021-11-26 NOTE — Assessment & Plan Note (Signed)
Controlled.  No concerns. Continue to monitor.  

## 2021-11-26 NOTE — Progress Notes (Signed)
? ?Subjective:  ? ? Patient ID: Kelly Gregory, female    DOB: December 22, 1985, 36 y.o.   MRN: [id number] ? ?HPI ? ?Kelly Gregory is a very pleasant 36 y.o. female with a history of hypertension, migraines, type 2 diabetes, rheumatoid arthritis, hyperlipidemia who presents today for complete physical and follow up of chronic conditions. ? ?Immunizations: ?-Tetanus: Complete in 2020 ?-Influenza: Completed the season ?-Covid-19: 1 vaccine ? ?Diet: Fair diet.  ?Exercise: No regular exercise. ? ?Eye exam: Completes annually  ?Dental exam: Completes semi-annually  ? ?Pap Smear: Completed per GYN. ? ? ?BP Readings from Last 3 Encounters:  ?11/26/21 132/88  ?11/05/21 (!) 144/94  ?05/09/21 124/68  ? ?Wt Readings from Last 3 Encounters:  ?11/26/21 164 lb (74.4 kg)  ?05/09/21 154 lb (69.9 kg)  ?11/08/20 153 lb (69.4 kg)  ? ? ? ? ?Review of Systems  ?Constitutional:  Negative for unexpected weight change.  ?HENT:  Negative for rhinorrhea.   ?Respiratory:  Negative for cough and shortness of breath.   ?Cardiovascular:  Negative for chest pain.  ?Gastrointestinal:  Negative for constipation and diarrhea.  ?Genitourinary:  Negative for difficulty urinating.  ?Musculoskeletal:  Positive for arthralgias.  ?Skin:  Negative for rash.  ?Allergic/Immunologic: Negative for environmental allergies.  ?Neurological:  Negative for dizziness and headaches.  ?Psychiatric/Behavioral:  The patient is not nervous/anxious.   ? ?   ? ? ?Past Medical History:  ?Diagnosis Date  ? Borderline diabetes   ? Cholelithiasis with acute cholecystitis 08/21/2019  ? DM (diabetes mellitus) ([place])   ? Elevated blood pressure   ? no meds  ? Gestational diabetes   ? Hypertension   ? Rheumatoid arthritis ([place])   ? Years ago, does not follow, no meds  ? S/P repeat low transverse C-section 03/27/2019  ? Seasonal allergies   ? Syncope   ? As a child, no problems during adulthood.  ? ? ?Social History  ? ?Socioeconomic History  ? Marital status: Married  ?  Spouse name: Not on  file  ? Number of children: Not on file  ? Years of education: Not on file  ? Highest education level: Not on file  ?Occupational History  ? Not on file  ?Tobacco Use  ? Smoking status: Never  ? Smokeless tobacco: Never  ?Vaping Use  ? Vaping Use: Never used  ?Substance and Sexual Activity  ? Alcohol use: No  ? Drug use: No  ? Sexual activity: Yes  ?  Comment: approx [redacted] wks gestation  ?Other Topics Concern  ? Not on file  ?Social History Narrative  ? Married.  ? 2 children.  ? Works as a Transport planner.  ? 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  ? ?Social Determinants of Health  ? ?Financial Resource Strain: Not on file  ?Food Insecurity: Not on file  ?Transportation Needs: Not on file  ?Physical Activity: Not on file  ?Stress: Not on file  ?Social Connections: Not on file  ?Intimate Partner Violence: Not on file  ? ? ?Past Surgical History:  ?Procedure Laterality Date  ? CESAREAN SECTION  04/01/2011  ? Procedure: CESAREAN SECTION;  Surgeon: Logan Bores;  Location: [facility];  Service: Gynecology;  Laterality: N/A;  ? CESAREAN SECTION N/A 05/23/2013  ? Procedure: CESAREAN SECTION;  Surgeon: Cheri Fowler, MD;  Location: [facility];  Service: Obstetrics;  Laterality: N/A;  ? CESAREAN SECTION N/A 03/27/2019  ? Procedure: CESAREAN SECTION;  Surgeon: Paula Compton, MD;  Location: [facility];  Service: Obstetrics;  Laterality: N/ANira Conn,  RNFA  ? CHOLECYSTECTOMY N/A 08/21/2019  ? Procedure: LAPAROSCOPIC CHOLECYSTECTOMY with IOC;  Surgeon: Donnie Mesa, MD;  Location: [place];  Service: General;  Laterality: N/A;  ? CYSTECTOMY Left   ? removed from underarm   ? DILATION AND EVACUATION N/A 02/01/2018  ? Procedure: DILATATION AND EVACUATION;  Surgeon: Paula Compton, MD;  Location: [facility];  Service: Gynecology;  Laterality: N/A;  ? TOOTH EXTRACTION    ? wisdom teeth ext  ? ? ?Family History  ?Adopted: Yes  ?Problem Relation Age of Onset  ? Other Other   ?     Adopted  ? ? ?Allergies  ?Allergen Reactions  ?  Azithromycin Nausea And Vomiting  ? Gatifloxacin Nausea And Vomiting and Other (See Comments)  ? Tequin Nausea And Vomiting  ? Penicillins Hives, Rash and Other (See Comments)  ?  Has patient had a PCN reaction causing immediate rash, facial/tongue/throat swelling, SOB or lightheadedness with hypotension: Unknown ?Has patient had a PCN reaction causing severe rash involving mucus membranes or skin necrosis: Unknown ?Has patient had a PCN reaction that required hospitalization: Unknown ?Has patient had a PCN reaction occurring within the last 10 years: No ?If all of the above answers are "NO", then may proceed with Cephalosporin use. ?  ? ? ?Current Outpatient Medications on File Prior to Visit  ?Medication Sig Dispense Refill  ? atorvastatin (LIPITOR) 20 MG tablet Take 1 tablet (20 mg total) by mouth daily. For cholesterol. 90 tablet 3  ? etanercept (ENBREL) 50 MG/ML injection Inject 50 mg into the skin every 14 (fourteen) days.    ? metFORMIN (GLUCOPHAGE-XR) 500 MG 24 hr tablet Take 1 tablet (500 mg total) by mouth daily with breakfast. For diabetes. 90 tablet 0  ? naproxen (NAPROSYN) 375 MG tablet Take 1 tablet (375 mg total) by mouth 2 (two) times daily with a meal. 30 tablet 0  ? NIFEdipine (PROCARDIA-XL/NIFEDICAL-XL) 30 MG 24 hr tablet TAKE 1 TABLET BY MOUTH EVERY DAY FOR BLOOD PRESSURE 90 tablet 0  ? predniSONE (DELTASONE) 5 MG tablet Take 5 mg by mouth. Take 1-2 tablets by mouth as needed with Enbrel injection.    ? ?No current facility-administered medications on file prior to visit.  ? ? ?BP 132/88   Pulse 91   Ht 4\' 9"  (1.448 m)   Wt 164 lb (74.4 kg)   LMP 11/17/2021   SpO2 97%   BMI 35.49 kg/m?  ?Objective:  ? Physical Exam ?HENT:  ?   Right Ear: Tympanic membrane and ear canal normal.  ?   Left Ear: Tympanic membrane and ear canal normal.  ?   Nose: Nose normal.  ?Eyes:  ?   Conjunctiva/sclera: Conjunctivae normal.  ?   Pupils: Pupils are equal, round, and reactive to light.  ?Neck:  ?   Thyroid:  No thyromegaly.  ?Cardiovascular:  ?   Rate and Rhythm: Normal rate and regular rhythm.  ?   Heart sounds: No murmur heard. ?Pulmonary:  ?   Effort: Pulmonary effort is normal.  ?   Breath sounds: Normal breath sounds. No rales.  ?Abdominal:  ?   General: Bowel sounds are normal.  ?   Palpations: Abdomen is soft.  ?   Tenderness: There is no abdominal tenderness.  ?Musculoskeletal:  ?   Cervical back: Neck supple.  ?   Comments: Orthopedic boot in place to right lower extremity  ?Lymphadenopathy:  ?   Cervical: No cervical adenopathy.  ?Skin: ?  General: Skin is warm and dry.  ?   Findings: No rash.  ?Neurological:  ?   Mental Status: She is alert and oriented to person, place, and time.  ?   Cranial Nerves: No cranial nerve deficit.  ?   Deep Tendon Reflexes: Reflexes are normal and symmetric.  ?Psychiatric:     ?   Mood and Affect: Mood normal.  ? ? ? ? ? ?   ?Assessment & Plan:  ? ? ? ? ?This visit occurred during the SARS-CoV-2 public health emergency.  Safety protocols were in place, including screening questions prior to the visit, additional usage of staff PPE, and extensive cleaning of exam room while observing appropriate contact time as indicated for disinfecting solutions.  ?

## 2021-11-26 NOTE — Assessment & Plan Note (Signed)
Increase in A1C to 6.0, slight increase from last visit. ? ?Continue Metformin XR 500 mg daily.  ?Urine microalbumin due and pending.  ? ?Intolerant to atorvastatin, flares her RA. ?Will switch to Crestor 5 mg. New Rx sent to pharmacy. ? ?Follow up in 6 months. ?

## 2021-11-26 NOTE — Assessment & Plan Note (Signed)
Following with rheumatology. ?Continue Enbrel 50 mg weekly. ?Continue prednisone PRN. ?

## 2021-11-26 NOTE — Assessment & Plan Note (Signed)
Following with orthopedics, healing appropriately per patient. ?

## 2021-11-26 NOTE — Patient Instructions (Signed)
Stop by the lab prior to leaving today. I will notify you of your results once received.  ? ?Start rosuvastatin (Crestor) 5 mg daily for cholesterol.  Please notify me if you experience the joint aches again. ? ?Please schedule a follow up visit for 6 months for a diabetes check. ? ?It was a pleasure to see you today! ? ?Preventive Care 68-38 Years Old, Female ?Preventive care refers to lifestyle choices and visits with your health care provider that can promote health and wellness. Preventive care visits are also called wellness exams. ?What can I expect for my preventive care visit? ?Counseling ?During your preventive care visit, your health care provider may ask about your: ?Medical history, including: ?Past medical problems. ?Family medical history. ?Pregnancy history. ?Current health, including: ?Menstrual cycle. ?Method of birth control. ?Emotional well-being. ?Home life and relationship well-being. ?Sexual activity and sexual health. ?Lifestyle, including: ?Alcohol, nicotine or tobacco, and drug use. ?Access to firearms. ?Diet, exercise, and sleep habits. ?Work and work Statistician. ?Sunscreen use. ?Safety issues such as seatbelt and bike helmet use. ?Physical exam ?Your health care provider may check your: ?Height and weight. These may be used to calculate your BMI (body mass index). BMI is a measurement that tells if you are at a healthy weight. ?Waist circumference. This measures the distance around your waistline. This measurement also tells if you are at a healthy weight and may help predict your risk of certain diseases, such as type 2 diabetes and high blood pressure. ?Heart rate and blood pressure. ?Body temperature. ?Skin for abnormal spots. ?What immunizations do I need? ?Vaccines are usually given at various ages, according to a schedule. Your health care provider will recommend vaccines for you based on your age, medical history, and lifestyle or other factors, such as travel or where you  work. ?What tests do I need? ?Screening ?Your health care provider may recommend screening tests for certain conditions. This may include: ?Pelvic exam and Pap test. ?Lipid and cholesterol levels. ?Diabetes screening. This is done by checking your blood sugar (glucose) after you have not eaten for a while (fasting). ?Hepatitis B test. ?Hepatitis C test. ?HIV (human immunodeficiency virus) test. ?STI (sexually transmitted infection) testing, if you are at risk. ?BRCA-related cancer screening. This may be done if you have a family history of breast, ovarian, tubal, or peritoneal cancers. ?Talk with your health care provider about your test results, treatment options, and if necessary, the need for more tests. ?Follow these instructions at home: ?Eating and drinking ? ?Eat a healthy diet that includes fresh fruits and vegetables, whole grains, lean protein, and low-fat dairy products. ?Take vitamin and mineral supplements as recommended by your health care provider. ?Do not drink alcohol if: ?Your health care provider tells you not to drink. ?You are pregnant, may be pregnant, or are planning to become pregnant. ?If you drink alcohol: ?Limit how much you have to 0-1 drink a day. ?Know how much alcohol is in your drink. In the [place] one drink equals one 12 oz bottle of beer (355 mL), one 5 oz glass of wine (148 mL), or one 1? oz glass of hard liquor (44 mL). ?Lifestyle ?Brush your teeth every morning and night with fluoride toothpaste. Floss one time each day. ?Exercise for at least 30 minutes 5 or more days each week. ?Do not use any products that contain nicotine or tobacco. These products include cigarettes, chewing tobacco, and vaping devices, such as e-cigarettes. If you need help quitting, ask your health care provider. ?  Do not use drugs. ?If you are sexually active, practice safe sex. Use a condom or other form of protection to prevent STIs. ?If you do not wish to become pregnant, use a form of birth control. If  you plan to become pregnant, see your health care provider for a prepregnancy visit. ?Find healthy ways to manage stress, such as: ?Meditation, yoga, or listening to music. ?Journaling. ?Talking to a trusted person. ?Spending time with friends and family. ?Minimize exposure to UV radiation to reduce your risk of skin cancer. ?Safety ?Always wear your seat belt while driving or riding in a vehicle. ?Do not drive: ?If you have been drinking alcohol. Do not ride with someone who has been drinking. ?If you have been using any mind-altering substances or drugs. ?While texting. ?When you are tired or distracted. ?Wear a helmet and other protective equipment during sports activities. ?If you have firearms in your house, make sure you follow all gun safety procedures. ?Seek help if you have been physically or sexually abused. ?What's next? ?Go to your health care provider once a year for an annual wellness visit. ?Ask your health care provider how often you should have your eyes and teeth checked. ?Stay up to date on all vaccines. ?This information is not intended to replace advice given to you by your health care provider. Make sure you discuss any questions you have with your health care provider. ?Document Revised: 02/26/2021 Document Reviewed: 02/26/2021 ?Elsevier Patient Education ? [place]. ? ?

## 2021-11-27 ENCOUNTER — Encounter: Payer: 59 | Admitting: Primary Care

## 2021-11-27 LAB — COMPREHENSIVE METABOLIC PANEL
ALT: 60 U/L — ABNORMAL HIGH (ref 0–35)
AST: 31 U/L (ref 0–37)
Albumin: 3.5 g/dL (ref 3.5–5.2)
Alkaline Phosphatase: 77 U/L (ref 39–117)
BUN: 14 mg/dL (ref 6–23)
CO2: 24 mEq/L (ref 19–32)
Calcium: 9 mg/dL (ref 8.4–10.5)
Chloride: 102 mEq/L (ref 96–112)
Creatinine, Ser: 0.66 mg/dL (ref 0.40–1.20)
GFR: 111.72 mL/min (ref >60.00)
Glucose, Bld: 89 mg/dL (ref 70–99)
Potassium: 3.9 mEq/L (ref 3.5–5.1)
Sodium: 134 mEq/L — ABNORMAL LOW (ref 135–145)
Total Bilirubin: 0.3 mg/dL (ref 0.2–1.2)
Total Protein: 6.9 g/dL (ref 6.0–8.3)

## 2021-11-27 LAB — CBC
HCT: 39.9 % (ref 36.0–46.0)
Hemoglobin: 13.8 g/dL (ref 12.0–15.0)
MCHC: 34.5 g/dL (ref 30.0–36.0)
MCV: 85.7 fl (ref 78.0–100.0)
Platelets: 373 10*3/uL (ref 150.0–400.0)
RBC: 4.66 Mil/uL (ref 3.87–5.11)
RDW: 13 % (ref 11.5–15.5)
WBC: 7.3 10*3/uL (ref 4.0–10.5)

## 2021-11-27 LAB — MICROALBUMIN / CREATININE URINE RATIO
Creatinine,U: 8.1 mg/dL
Microalb Creat Ratio: 8.7 mg/g (ref 0.0–30.0)
Microalb, Ur: <0.7 mg/dL (ref 0.0–1.9)

## 2021-11-27 LAB — LIPID PANEL
Cholesterol: 228 mg/dL — ABNORMAL HIGH (ref 0–200)
HDL: 38.6 mg/dL — ABNORMAL LOW (ref >39.00)
NonHDL: 189.23
Total CHOL/HDL Ratio: 6
Triglycerides: 273 mg/dL — ABNORMAL HIGH (ref 0.0–149.0)
VLDL: 54.6 mg/dL — ABNORMAL HIGH (ref 0.0–40.0)

## 2021-11-27 LAB — LDL CHOLESTEROL, DIRECT: Direct LDL: 152 mg/dL

## 2021-12-11 ENCOUNTER — Ambulatory Visit (HOSPITAL_BASED_OUTPATIENT_CLINIC_OR_DEPARTMENT_OTHER)
Admission: RE | Admit: 2021-12-11 | Discharge: 2021-12-11 | Disposition: A | Discharge status: Home / Self Care | Payer: 59 | Source: Ambulatory Visit | Attending: Orthopaedic Surgery | Admitting: Orthopaedic Surgery

## 2021-12-11 ENCOUNTER — Ambulatory Visit (HOSPITAL_BASED_OUTPATIENT_CLINIC_OR_DEPARTMENT_OTHER): Payer: 59 | Admitting: Orthopaedic Surgery

## 2021-12-11 ENCOUNTER — Other Ambulatory Visit (HOSPITAL_BASED_OUTPATIENT_CLINIC_OR_DEPARTMENT_OTHER): Payer: Self-pay | Admitting: Orthopaedic Surgery

## 2021-12-11 DIAGNOSIS — S82831D Other fracture of upper and lower end of right fibula, subsequent encounter for closed fracture with routine healing: Secondary | ICD-10-CM

## 2021-12-11 DIAGNOSIS — S82831A Other fracture of upper and lower end of right fibula, initial encounter for closed fracture: Secondary | ICD-10-CM | POA: Diagnosis not present

## 2021-12-11 NOTE — Progress Notes (Signed)
? ?                            ? ? ?Chief Complaint: Right ankle pain ?  ? ? ?History of Present Illness:  ? ?12/11/2021: Presents today overall doing extremely well.  She has been out of her cam boot.  She has no pain in the ankle.  She is hoping to get back to running a half marathon. ? ?Kelly Gregory is a 36 y.o. female presents with right ankle pain and fracture after a fall and tripped over a branch while running on 22 February.  Since that time she had gone to the emergency room and was placed in a splint and made nonweightbearing.  She is experiencing swelling and pain on the lateral aspect of the ankle.  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  She has been using crutches.  She is taking anti-inflammatories for pain.  She is icing as well. ? ? ? ?Surgical History:   ?None ? ?PMH/PSH/Family History/Social History/Meds/Allergies:   ? ?Past Medical History:  ?Diagnosis Date  ? Borderline diabetes   ? Cholelithiasis with acute cholecystitis 08/21/2019  ? DM (diabetes mellitus) (HCC)   ? Elevated blood pressure   ? no meds  ? Gestational diabetes   ? Hypertension   ? Rheumatoid arthritis (HCC)   ? Years ago, does not follow, no meds  ? S/P repeat low transverse C-section 03/27/2019  ? Seasonal allergies   ? Syncope   ? As a child, no problems during adulthood.  ? ?Past Surgical History:  ?Procedure Laterality Date  ? CESAREAN SECTION  04/01/2011  ? Procedure: CESAREAN SECTION;  Surgeon: Oliver Pila;  Location: [facility];  Service: Gynecology;  Laterality: N/A;  ? CESAREAN SECTION N/A 05/23/2013  ? Procedure: CESAREAN SECTION;  Surgeon: Lavina Hamman, MD;  Location: [facility];  Service: Obstetrics;  Laterality: N/A;  ? CESAREAN SECTION N/A 03/27/2019  ? Procedure: CESAREAN SECTION;  Surgeon: Huel Cote, MD;  Location: [facility] LD ORS;  Service: Obstetrics;  Laterality: N/AHerbert Seta,  RNFA  ? CHOLECYSTECTOMY N/A 08/21/2019  ? Procedure: LAPAROSCOPIC  CHOLECYSTECTOMY with IOC;  Surgeon: Manus Rudd, MD;  Location: [facility] OR;  Service: General;  Laterality: N/A;  ? CYSTECTOMY Left   ? removed from underarm   ? DILATION AND EVACUATION N/A 02/01/2018  ? Procedure: DILATATION AND EVACUATION;  Surgeon: Huel Cote, MD;  Location: [facility];  Service: Gynecology;  Laterality: N/A;  ? TOOTH EXTRACTION    ? wisdom teeth ext  ? ?Social History  ? ?Socioeconomic History  ? Marital status: Married  ?  Spouse name: Not on file  ? Number of children: Not on file  ? Years of education: Not on file  ? Highest education level: Not on file  ?Occupational History  ? Not on file  ?Tobacco Use  ? Smoking status: Never  ? Smokeless tobacco: Never  ?Vaping Use  ? Vaping Use: Never used  ?Substance and Sexual Activity  ? Alcohol use: No  ? Drug use: No  ? Sexual activity: Yes  ?  Comment: approx [redacted] wks gestation  ?Other Topics Concern  ? Not on file  ?Social History Narrative  ? Married.  ? 2 children.  ? Works as a Paramedic.  ? 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  ? ?Social Determinants of Health  ? ?Financial Resource Strain:  Not on file  ?Food Insecurity: Not on file  ?Transportation Needs: Not on file  ?Physical Activity: Not on file  ?Stress: Not on file  ?Social Connections: Not on file  ? ?Family History  ?Adopted: Yes  ?Problem Relation Age of Onset  ? Other Other   ?     Adopted  ? ?Allergies  ?Allergen Reactions  ? Azithromycin Nausea And Vomiting  ? Gatifloxacin Nausea And Vomiting and Other (See Comments)  ? Tequin Nausea And Vomiting  ? Penicillins Hives, Rash and Other (See Comments)  ?  Has patient had a PCN reaction causing immediate rash, facial/tongue/throat swelling, SOB or lightheadedness with hypotension: Unknown ?Has patient had a PCN reaction causing severe rash involving mucus membranes or skin necrosis: Unknown ?Has patient had a PCN reaction that required hospitalization: Unknown ?Has patient had a PCN reaction occurring within the last 10 years:  No ?If all of the above answers are "NO", then may proceed with Cephalosporin use. ?  ? ?Current Outpatient Medications  ?Medication Sig Dispense Refill  ? etanercept (ENBREL) 50 MG/ML injection Inject 50 mg into the skin every 14 (fourteen) days.    ? metFORMIN (GLUCOPHAGE-XR) 500 MG 24 hr tablet Take 1 tablet (500 mg total) by mouth daily with breakfast. For diabetes. 90 tablet 0  ? naproxen (NAPROSYN) 375 MG tablet Take 1 tablet (375 mg total) by mouth 2 (two) times daily with a meal. 30 tablet 0  ? NIFEdipine (PROCARDIA-XL/NIFEDICAL-XL) 30 MG 24 hr tablet TAKE 1 TABLET BY MOUTH EVERY DAY FOR BLOOD PRESSURE 90 tablet 0  ? predniSONE (DELTASONE) 5 MG tablet Take 5 mg by mouth. Take 1-2 tablets by mouth as needed with Enbrel injection.    ? rosuvastatin (CRESTOR) 5 MG tablet Take 1 tablet (5 mg total) by mouth daily. For cholesterol. 30 tablet 0  ? ?No current facility-administered medications for this visit.  ? ?No results found. ? ?Review of Systems:   ?A ROS was performed including pertinent positives and negatives as documented in the HPI. ? ?Physical Exam :   ?Constitutional: NAD and appears stated age ?Neurological: Alert and oriented ?Psych: Appropriate affect and cooperative ?Last menstrual period 11/17/2021, unknown if currently breastfeeding.  ? ?Comprehensive Musculoskeletal Exam:   ? ?No tenderness palpation about the right distal fibula.  No tenderness about the medial malleolus.  She is able to dorsiflex to 20 degrees plantar flex 30 degrees.  Sensation is intact in all distributions of the right foot.  2+ dorsalis pedis pulse.   ?Imaging:   ?Xray (3 views right ankle): ?Nondisplaced distal fibula avulsion fracture with a congruent ankle mortise ? ?I personally reviewed and interpreted the radiographs. ? ? ?Assessment:   ?36 year old female with a right distal fibula tip avulsion fracture.  I did describe that this is more consistent with a ligamentous avulsion of the anterior talofibular ligament.   Overall she is doing extremely well at today's visit.  She will continue to advance her activity as tolerated.  I have described that if she returns to running she may experience some increased soreness and I have advised her to ice and elevate as needed.  That she is doing extremely well and I would like to return her to all activity at this time ? ?Plan :   ? ?-Return to clinic as needed ? ? ? ? ?I personally saw and evaluated the patient, and participated in the management and treatment plan. ? ?Huel CoteSteven Audrey Thull, MD ?Attending Physician, Orthopedic Surgery ? ?This document  was dictated using Conservation officer, historic buildings. A reasonable attempt at proof reading has been made to minimize errors. ?

## 2021-12-19 ENCOUNTER — Other Ambulatory Visit: Payer: Self-pay | Admitting: Primary Care

## 2021-12-19 DIAGNOSIS — E785 Hyperlipidemia, unspecified: Secondary | ICD-10-CM

## 2022-01-28 ENCOUNTER — Other Ambulatory Visit: Payer: Self-pay | Admitting: Primary Care

## 2022-01-28 DIAGNOSIS — E119 Type 2 diabetes mellitus without complications: Secondary | ICD-10-CM

## 2022-02-02 ENCOUNTER — Other Ambulatory Visit: Payer: Self-pay | Admitting: Primary Care

## 2022-02-02 DIAGNOSIS — I10 Essential (primary) hypertension: Secondary | ICD-10-CM

## 2022-05-29 ENCOUNTER — Ambulatory Visit: Payer: 59 | Admitting: Primary Care

## 2022-06-04 ENCOUNTER — Encounter: Payer: Self-pay | Admitting: Primary Care

## 2022-06-04 ENCOUNTER — Ambulatory Visit: Payer: 59 | Admitting: Primary Care

## 2022-06-04 VITALS — BP 124/86 | HR 77 | Temp 97.5°F | Ht <= 58 in | Wt 167.0 lb

## 2022-06-04 DIAGNOSIS — N939 Abnormal uterine and vaginal bleeding, unspecified: Secondary | ICD-10-CM

## 2022-06-04 DIAGNOSIS — E119 Type 2 diabetes mellitus without complications: Secondary | ICD-10-CM | POA: Diagnosis not present

## 2022-06-04 DIAGNOSIS — R5383 Other fatigue: Secondary | ICD-10-CM

## 2022-06-04 DIAGNOSIS — I10 Essential (primary) hypertension: Secondary | ICD-10-CM | POA: Diagnosis not present

## 2022-06-04 DIAGNOSIS — E785 Hyperlipidemia, unspecified: Secondary | ICD-10-CM | POA: Diagnosis not present

## 2022-06-04 DIAGNOSIS — Z23 Encounter for immunization: Secondary | ICD-10-CM | POA: Diagnosis not present

## 2022-06-04 DIAGNOSIS — E1165 Type 2 diabetes mellitus with hyperglycemia: Secondary | ICD-10-CM | POA: Diagnosis not present

## 2022-06-04 DIAGNOSIS — M069 Rheumatoid arthritis, unspecified: Secondary | ICD-10-CM

## 2022-06-04 HISTORY — DX: Abnormal uterine and vaginal bleeding, unspecified: N93.9

## 2022-06-04 LAB — CBC
HCT: 42.1 % (ref 36.0–46.0)
Hemoglobin: 14 g/dL (ref 12.0–15.0)
MCHC: 33.2 g/dL (ref 30.0–36.0)
MCV: 82.7 fl (ref 78.0–100.0)
Platelets: 319 10*3/uL (ref 150.0–400.0)
RBC: 5.09 Mil/uL (ref 3.87–5.11)
RDW: 14.5 % (ref 11.5–15.5)
WBC: 5.9 10*3/uL (ref 4.0–10.5)

## 2022-06-04 LAB — LIPID PANEL
Cholesterol: 253 mg/dL — ABNORMAL HIGH (ref 0–200)
HDL: 41.8 mg/dL (ref >39.00)
NonHDL: 211.64
Total CHOL/HDL Ratio: 6
Triglycerides: 241 mg/dL — ABNORMAL HIGH (ref 0.0–149.0)
VLDL: 48.2 mg/dL — ABNORMAL HIGH (ref 0.0–40.0)

## 2022-06-04 LAB — LDL CHOLESTEROL, DIRECT: Direct LDL: 184 mg/dL

## 2022-06-04 LAB — POCT GLYCOSYLATED HEMOGLOBIN (HGB A1C): Hemoglobin A1C: 6.3 % — AB (ref 4.0–5.6)

## 2022-06-04 NOTE — Assessment & Plan Note (Signed)
Uncontrolled.  Follow up with GYN as scheduled. Checking CBC and iron studies

## 2022-06-04 NOTE — Assessment & Plan Note (Signed)
Controlled.   Continue Nifedipine XL 30 mg.

## 2022-06-04 NOTE — Progress Notes (Signed)
Established Patient Office Visit  Subjective   Patient ID: Kelly Gregory, female    DOB: [birth year]  Age: 36 y.o. MRN: [id number]  Chief Complaint  Patient presents with   Follow-up    diabetes    HPI  Kelly Gregory is a 36 year old female with past medical history of hypertension, migraines, type 2 diabetes, rheumatoid arthritis and hyperlipidemia presents today for diabetes follow up.   Current medications include: Metformin XR 500 mg daily.   She is checking her blood glucose occasionally and is getting readings of 100-130s.   Last A1C: March 6.0. due today.  Last Eye Exam: 2022. Due now. Last Foot Exam: Due. Pneumonia Vaccination: 2017 Urine Microalbumin: Negative in March, 2023. Statin: None  Dietary changes since last visit: Baked food, no fried foods, rice and pasta. She does include vegetable in her meals.  Exercise: Since march, she has been walking and running four times a week for 1-2 hours. She is also doing weight lifting.  Abnormal vaginal bleeding: Followed by gyn. She has been bleeding intermittently for one year. She was started Provera in July and takes it first 15 days of the month to help regulate her cycles. She has not seen any improvement. She has gyn follow up in October.  Has painful swelling in her hands and feet that does not go away with elevation. It is better when she wakes up in the morning. Follows with Rheumatology.   She has a history of hypertension and is currently taking nifedipine daily with no missed doses.   Fatigue: Feels tired all the time since July. She sleeps well. Goes to bed around 9 pm and wakes up with 7 am. She wakes up occasionally to void but not every night. She feels tired when she wakes up. She thinks she snores but never has been told that she does. She does not recall having a history of anemia.     Patient Active Problem List   Diagnosis Date Noted   Fatigue 06/04/2022   Closed traumatic nondisplaced fracture of  distal end of right fibula 11/26/2021   Mixed stress and urge urinary incontinence 12/06/2019   Rheumatoid arthritis ([place]) 07/17/2019   Migraines 11/26/2016   Preventative health care 10/04/2015   Hyperlipemia 10/04/2015   Essential hypertension 08/23/2015   Type 2 diabetes mellitus with hyperglycemia ([place]) 05/25/2013   Past Medical History:  Diagnosis Date   Borderline diabetes    Cholelithiasis with acute cholecystitis 08/21/2019   DM (diabetes mellitus) ([place])    Elevated blood pressure    no meds   Gestational diabetes    Hypertension    Rheumatoid arthritis ([place])    Years ago, does not follow, no meds   S/P repeat low transverse C-section 03/27/2019   Seasonal allergies    Syncope    As a child, no problems during adulthood.   Past Surgical History:  Procedure Laterality Date   CESAREAN SECTION  04/01/2011   Procedure: CESAREAN SECTION;  Surgeon: Logan Bores;  Location: [facility];  Service: Gynecology;  Laterality: N/A;   CESAREAN SECTION N/A 05/23/2013   Procedure: CESAREAN SECTION;  Surgeon: Cheri Fowler, MD;  Location: [place] [facility] [facility];  Service: Obstetrics;  Laterality: N/A;   CESAREAN SECTION N/A 03/27/2019   Procedure: CESAREAN SECTION;  Surgeon: Paula Compton, MD;  Location: [facility] LD ORS;  Service: Obstetrics;  Laterality: N/ANira Conn,  RNFA   CHOLECYSTECTOMY N/A 08/21/2019   Procedure: LAPAROSCOPIC CHOLECYSTECTOMY with IOC;  Surgeon: Donnie Mesa,  MD;  Location: [facility] OR;  Service: General;  Laterality: N/A;   CYSTECTOMY Left    removed from underarm    DILATION AND EVACUATION N/A 02/01/2018   Procedure: DILATATION AND EVACUATION;  Surgeon: Huel Cote, MD;  Location: [facility];  Service: Gynecology;  Laterality: N/A;   TOOTH EXTRACTION     wisdom teeth ext   Social History   Tobacco Use   Smoking status: Never   Smokeless tobacco: Never  Vaping Use   Vaping Use: Never used  Substance Use Topics   Alcohol use: No   Drug use: No   Family History  Adopted: Yes   Problem Relation Age of Onset   Other Other        Adopted   Allergies  Allergen Reactions   Azithromycin Nausea And Vomiting   Gatifloxacin Nausea And Vomiting and Other (See Comments)   Tequin Nausea And Vomiting   Penicillins Hives, Rash and Other (See Comments)    Has patient had a PCN reaction causing immediate rash, facial/tongue/throat swelling, SOB or lightheadedness with hypotension: Unknown Has patient had a PCN reaction causing severe rash involving mucus membranes or skin necrosis: Unknown Has patient had a PCN reaction that required hospitalization: Unknown Has patient had a PCN reaction occurring within the last 10 years: No If all of the above answers are "NO", then may proceed with Cephalosporin use.       Review of Systems  Constitutional:  Positive for malaise/fatigue. Negative for chills, fever and weight loss.  Eyes: Negative.   Respiratory: Negative.    Cardiovascular: Negative.   Gastrointestinal: Negative.   Genitourinary: Negative.   Musculoskeletal: Negative.   Neurological: Negative.   Endo/Heme/Allergies: Negative.       Objective:     BP 124/86   Pulse 77   Temp (!) 97.5 F (36.4 C) (Temporal)   Ht  (1.448 m)   Wt 167 lb (75.8 kg)   SpO2 95%   BMI 36.14 kg/m  BP Readings from Last 3 Encounters:  06/04/22 124/86  11/26/21 132/88  11/05/21 (!) 144/94   Wt Readings from Last 3 Encounters:  06/04/22 167 lb (75.8 kg)  11/26/21 164 lb (74.4 kg)  05/09/21 154 lb (69.9 kg)      Physical Exam Vitals reviewed.  Constitutional:      Appearance: Normal appearance.  Cardiovascular:     Rate and Rhythm: Normal rate and regular rhythm.     Pulses: Normal pulses.     Heart sounds: Normal heart sounds.  Pulmonary:     Effort: Pulmonary effort is normal.     Breath sounds: Normal breath sounds.  Neurological:     Mental Status: She is alert and oriented to person, place, and time.  Psychiatric:        Mood and Affect: Mood normal.       Results for orders placed or performed in visit on 06/04/22  POCT glycosylated hemoglobin (Hb A1C)  Result Value Ref Range   Hemoglobin A1C 6.3 (A) 4.0 - 5.6 %   HbA1c POC (<> result, manual entry)     HbA1c, POC (prediabetic range)     HbA1c, POC (controlled diabetic range)         The ASCVD Risk score (Arnett DK, et al., 2019) failed to calculate for the following reasons:   The 2019 ASCVD risk score is only valid for ages 37 to 90    Assessment & Plan:   Problem List Items Addressed This Visit  Cardiovascular and Mediastinum   Essential hypertension    Controlled.   Continue Nifedipine XL 30 mg.        Endocrine   Type 2 diabetes mellitus with hyperglycemia (HCC) - Primary    A1C 6.3 today. Slight increase in A1C from march.   Continue Metformin XR 500 mg daily.   Intolerant to statins. Worsens Myalgias.   Follow up in 6 months.         Musculoskeletal and Integument   Rheumatoid arthritis [facility])    Following with rheumatology.   Continue Enbrel 50 mg weekly.         Other   Hyperlipemia    Lipid panel pending.   Consider pravastatin. Intolerant to atorvastatin and rosuvastatin.      Relevant Orders   Lipid panel   Fatigue    Unclear etiology.   Will check CBC, TSH, Vitamin B 12 and Vitamin D levels.   If normal, will consider sleep study.   Will continue to monitor.       Relevant Orders   Vitamin B12   Vitamin D, 25-hydroxy   TSH   CBC   IBC + Ferritin   Other Visit Diagnoses     Need for immunization against influenza       Relevant Orders   Flu Vaccine QUAD 18mo+IM (Fluarix, Fluzone & Alfiuria Quad PF) (Completed)       No follow-ups on file.    [place]esto Charon, BSN-RN, DNP STUDENT

## 2022-06-04 NOTE — Assessment & Plan Note (Addendum)
Unclear etiology.   Will check CBC, iron studies, TSH, Vitamin B 12 and Vitamin D levels.   If normal, will consider sleep study.   Will continue to monitor.   I evaluated patient, was consulted regarding treatment, and agree with assessment and plan per Tinnie Gens, RN, DNP student.   Allie Bossier, NP-C

## 2022-06-04 NOTE — Patient Instructions (Signed)
Stop by the lab prior to leaving today. I will notify you of your results once received.   It was a pleasure to see you today!  

## 2022-06-04 NOTE — Assessment & Plan Note (Addendum)
A1C 6.3 today. Slight increase in A1C from march.   Continue Metformin XR 500 mg daily.   Intolerant to statins. Consider pravastatin, await lipids.  Follow up in 6 months.   I evaluated patient, was consulted regarding treatment, and agree with assessment and plan per Tinnie Gens, RN, DNP student.   Allie Bossier, NP-C

## 2022-06-04 NOTE — Progress Notes (Signed)
Subjective:    Patient ID: Kelly Gregory, female    DOB: September 01, 1986, 36 y.o.   MRN: [id number]  HPI  Kelly Gregory is a very pleasant 36 y.o. female with a history of type 2 diabetes, hyperlipidemia, migraines, rheumatoid arthritis who presents today for follow up of diabetes. She would also like to discuss abnormal uterine bleeding.  1) Type 2 Diabetes:  Current medications include: Metformin XR 500 mg daily  She is checking her blood glucose 1 times daily and is getting readings of low 100's.   Last A1C: 6.0 in March 2023, 6.3 today Last Eye Exam: Due Last Foot Exam: Due Pneumonia Vaccination: 2017 Urine Microalbumin: UTD Statin: None. Intolerant.   Dietary changes since last visit: None. Overall a healthy diet.    Exercise: Running regularly   2) Chronic Fatigue: History of abnormal uterine and breakthrough bleeding for the last 1 year. Follows with GYN. Currently managed on Provera for the first 15 days of the month, doesn't feel like this provides much relief.   She has noticed feeling very tired for the last several months, despite a full night's sleep. She sleeps well, doesn't wake during the night often. She hasn't been told that she snores.   She checks glucose levels on occasion which are running in the low 100's.   She denies feeling overly anxious or stressed. She is able to run and lift weights despite her fatigue, but she's found her typical activities to be harder.   BP Readings from Last 3 Encounters:  06/04/22 124/86  11/26/21 132/88  11/05/21 (!) 144/94     Review of Systems  Constitutional:  Positive for fatigue.  Respiratory:  Negative for shortness of breath.   Cardiovascular:  Negative for chest pain.  Genitourinary:  Positive for menstrual problem.  Neurological:  Negative for numbness.  Psychiatric/Behavioral:  Negative for sleep disturbance. The patient is not nervous/anxious.          Past Medical History:  Diagnosis Date    Borderline diabetes    Cholelithiasis with acute cholecystitis 08/21/2019   DM (diabetes mellitus) (HCC)    Elevated blood pressure    no meds   Gestational diabetes    Hypertension    Rheumatoid arthritis (HCC)    Years ago, does not follow, no meds   S/P repeat low transverse C-section 03/27/2019   Seasonal allergies    Syncope    As a child, no problems during adulthood.    Social History   Socioeconomic History   Marital status: Married    Spouse name: Not on file   Number of children: Not on file   Years of education: Not on file   Highest education level: Not on file  Occupational History   Not on file  Tobacco Use   Smoking status: Never   Smokeless tobacco: Never  Vaping Use   Vaping Use: Never used  Substance and Sexual Activity   Alcohol use: No   Drug use: No   Sexual activity: Yes    Comment: approx [redacted] wks gestation  Other Topics Concern   Not on file  Social History Narrative   Married.   2 children.   Works as a Paramedic.   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   Social Determinants of Health   Financial Resource Strain: Low Risk  (03/16/2019)   Overall Financial Resource Strain (CARDIA)    Difficulty of Paying Living Expenses: Not hard at all  Food Insecurity: No Food  Insecurity (03/16/2019)   Hunger Vital Sign    Worried About Running Out of Food in the Last Year: Never true    Ran Out of Food in the Last Year: Never true  Transportation Needs: Unknown (03/16/2019)   PRAPARE - Administrator, Civil Service (Medical): No    Lack of Transportation (Non-Medical): Not on file  Physical Activity: Not on file  Stress: No Stress Concern Present (03/16/2019)   [employer] of Occupational Health - Occupational Stress Questionnaire    Feeling of Stress : Not at all  Social Connections: Not on file  Intimate Partner Violence: Not At Risk (03/16/2019)   Humiliation, Afraid, Rape, and Kick questionnaire    Fear of Current or  Ex-Partner: No    Emotionally Abused: No    Physically Abused: No    Sexually Abused: No    Past Surgical History:  Procedure Laterality Date   CESAREAN SECTION  04/01/2011   Procedure: CESAREAN SECTION;  Surgeon: Oliver Pila;  Location: [facility];  Service: Gynecology;  Laterality: N/A;   CESAREAN SECTION N/A 05/23/2013   Procedure: CESAREAN SECTION;  Surgeon: Lavina Hamman, MD;  Location: [facility];  Service: Obstetrics;  Laterality: N/A;   CESAREAN SECTION N/A 03/27/2019   Procedure: CESAREAN SECTION;  Surgeon: Huel Cote, MD;  Location: [facility];  Service: Obstetrics;  Laterality: N/AHerbert Seta,  RNFA   CHOLECYSTECTOMY N/A 08/21/2019   Procedure: LAPAROSCOPIC CHOLECYSTECTOMY with IOC;  Surgeon: Manus Rudd, MD;  Location: [facility];  Service: General;  Laterality: N/A;   CYSTECTOMY Left    removed from underarm    DILATION AND EVACUATION N/A 02/01/2018   Procedure: DILATATION AND EVACUATION;  Surgeon: Huel Cote, MD;  Location: [facility];  Service: Gynecology;  Laterality: N/A;   TOOTH EXTRACTION     wisdom teeth ext    Family History  Adopted: Yes  Problem Relation Age of Onset   Other Other        Adopted    Allergies  Allergen Reactions   Azithromycin Nausea And Vomiting   Gatifloxacin Nausea And Vomiting and Other (See Comments)   Tequin Nausea And Vomiting   Penicillins Hives, Rash and Other (See Comments)    Has patient had a PCN reaction causing immediate rash, facial/tongue/throat swelling, SOB or lightheadedness with hypotension: Unknown Has patient had a PCN reaction causing severe rash involving mucus membranes or skin necrosis: Unknown Has patient had a PCN reaction that required hospitalization: Unknown Has patient had a PCN reaction occurring within the last 10 years: No If all of the above answers are "NO", then may proceed with Cephalosporin use.     Current Outpatient Medications on File Prior to Visit  Medication Sig Dispense Refill    etanercept (ENBREL) 50 MG/ML injection Inject 50 mg into the skin every 14 (fourteen) days.     metFORMIN (GLUCOPHAGE-XR) 500 MG 24 hr tablet TAKE 1 TABLET BY MOUTH DAILY WITH BREAKFAST FOR DIABETES 90 tablet 1   NIFEdipine (PROCARDIA-XL/NIFEDICAL-XL) 30 MG 24 hr tablet TAKE 1 TABLET BY MOUTH EVERY DAY FOR BLOOD PRESSURE 90 tablet 2   medroxyPROGESTERone (PROVERA) 5 MG tablet Take by mouth.     No current facility-administered medications on file prior to visit.    BP 124/86   Pulse 77   Temp (!) 97.5 F (36.4 C) (Temporal)   Ht  (1.448 m)   Wt 167 lb (75.8 kg)   SpO2 95%   BMI 36.14 kg/m  Objective:   Physical Exam Cardiovascular:     Rate and Rhythm: Normal rate and regular rhythm.  Pulmonary:     Effort: Pulmonary effort is normal.     Breath sounds: Normal breath sounds.  Musculoskeletal:     Cervical back: Neck supple.  Skin:    General: Skin is warm and dry.           Assessment & Plan:   Problem List Items Addressed This Visit       Cardiovascular and Mediastinum   Essential hypertension    Controlled.   Continue Nifedipine XL 30 mg.        Endocrine   Type 2 diabetes mellitus with hyperglycemia (HCC) - Primary    A1C 6.3 today. Slight increase in A1C from march.   Continue Metformin XR 500 mg daily.   Intolerant to statins. Consider pravastatin, await lipids.  Follow up in 6 months.   I evaluated patient, was consulted regarding treatment, and agree with assessment and plan per Tinnie Gens, RN, DNP student.   Allie Bossier, NP-C         Musculoskeletal and Integument   Rheumatoid arthritis [facility])    Following with rheumatology.   Continue Enbrel 50 mg weekly.         Genitourinary   Abnormal uterine bleeding    Uncontrolled.  Follow up with GYN as scheduled. Checking CBC and iron studies        Other   Hyperlipemia    Lipid panel pending.   Consider pravastatin. Intolerant to atorvastatin and rosuvastatin.  I evaluated  patient, was consulted regarding treatment, and agree with assessment and plan per Tinnie Gens, RN, DNP student.   Allie Bossier, NP-C       Relevant Orders   Lipid panel   Fatigue    Unclear etiology.   Will check CBC, iron studies, TSH, Vitamin B 12 and Vitamin D levels.   If normal, will consider sleep study.   Will continue to monitor.   I evaluated patient, was consulted regarding treatment, and agree with assessment and plan per Tinnie Gens, RN, DNP student.   Allie Bossier, NP-C       Relevant Orders   Vitamin B12   Vitamin D, 25-hydroxy   TSH   CBC   IBC + Ferritin   Other Visit Diagnoses     Need for immunization against influenza       Relevant Orders   Flu Vaccine QUAD 21mo+IM (Fluarix, Fluzone & Alfiuria Quad PF) (Completed)          Pleas Koch, NP

## 2022-06-04 NOTE — Assessment & Plan Note (Addendum)
Lipid panel pending.   Consider pravastatin. Intolerant to atorvastatin and rosuvastatin.  I evaluated patient, was consulted regarding treatment, and agree with assessment and plan per Tinnie Gens, RN, DNP student.   Allie Bossier, NP-C

## 2022-06-04 NOTE — Assessment & Plan Note (Signed)
Following with rheumatology.   Continue Enbrel 50 mg weekly.

## 2022-06-05 ENCOUNTER — Other Ambulatory Visit: Payer: Self-pay | Admitting: Primary Care

## 2022-06-05 DIAGNOSIS — E785 Hyperlipidemia, unspecified: Secondary | ICD-10-CM

## 2022-06-05 LAB — VITAMIN B12: Vitamin B-12: 892 pg/mL (ref 211–911)

## 2022-06-05 LAB — IBC + FERRITIN
Ferritin: 10.4 ng/mL (ref 10.0–291.0)
Iron: 101 ug/dL (ref 42–145)
Saturation Ratios: 17.9 % — ABNORMAL LOW (ref 20.0–50.0)
TIBC: 562.8 ug/dL — ABNORMAL HIGH (ref 250.0–450.0)
Transferrin: 402 mg/dL — ABNORMAL HIGH (ref 212.0–360.0)

## 2022-06-05 LAB — TSH: TSH: 1.28 u[IU]/mL (ref 0.35–5.50)

## 2022-06-05 LAB — VITAMIN D 25 HYDROXY (VIT D DEFICIENCY, FRACTURES): VITD: 24.66 ng/mL — ABNORMAL LOW (ref 30.00–100.00)

## 2022-06-05 MED ORDER — PRAVASTATIN SODIUM 40 MG PO TABS: 40.0000 mg | ORAL_TABLET | Freq: Every day | ORAL | 0 refills | Status: DC

## 2022-07-29 ENCOUNTER — Other Ambulatory Visit: Payer: Self-pay | Admitting: Primary Care

## 2022-07-29 DIAGNOSIS — E119 Type 2 diabetes mellitus without complications: Secondary | ICD-10-CM

## 2022-09-10 ENCOUNTER — Other Ambulatory Visit: Payer: Self-pay | Admitting: Primary Care

## 2022-09-10 DIAGNOSIS — E785 Hyperlipidemia, unspecified: Secondary | ICD-10-CM

## 2022-09-11 ENCOUNTER — Emergency Department (HOSPITAL_COMMUNITY): Payer: 59

## 2022-09-11 ENCOUNTER — Other Ambulatory Visit: Payer: Self-pay

## 2022-09-11 ENCOUNTER — Encounter (HOSPITAL_COMMUNITY): Payer: Self-pay | Admitting: Emergency Medicine

## 2022-09-11 ENCOUNTER — Emergency Department (HOSPITAL_COMMUNITY)
Admission: EM | Admit: 2022-09-11 | Discharge: 2022-09-11 | Disposition: A | Discharge status: Home / Self Care | Payer: 59 | Source: Home / Self Care | Attending: Emergency Medicine | Admitting: Emergency Medicine

## 2022-09-11 DIAGNOSIS — I10 Essential (primary) hypertension: Secondary | ICD-10-CM | POA: Insufficient documentation

## 2022-09-11 DIAGNOSIS — R519 Headache, unspecified: Secondary | ICD-10-CM

## 2022-09-11 DIAGNOSIS — R030 Elevated blood-pressure reading, without diagnosis of hypertension: Secondary | ICD-10-CM

## 2022-09-11 LAB — CSF CELL COUNT WITH DIFFERENTIAL
RBC Count, CSF: 11000 /mm3 — ABNORMAL HIGH
RBC Count, CSF: 22500 /mm3 — ABNORMAL HIGH
Tube #: 1
Tube #: 1
WBC, CSF: 2 /mm3 (ref 0–5)
WBC, CSF: 6 /mm3 — ABNORMAL HIGH (ref 0–5)

## 2022-09-11 LAB — CBC WITH DIFFERENTIAL/PLATELET
Abs Immature Granulocytes: 0.02 10*3/uL (ref 0.00–0.07)
Basophils Absolute: 0 10*3/uL (ref 0.0–0.1)
Basophils Relative: 0 %
Eosinophils Absolute: 0.1 10*3/uL (ref 0.0–0.5)
Eosinophils Relative: 2 %
HCT: 48.4 % — ABNORMAL HIGH (ref 36.0–46.0)
Hemoglobin: 16 g/dL — ABNORMAL HIGH (ref 12.0–15.0)
Immature Granulocytes: 0 %
Lymphocytes Relative: 39 %
Lymphs Abs: 3.1 10*3/uL (ref 0.7–4.0)
MCH: 27.6 pg (ref 26.0–34.0)
MCHC: 33.1 g/dL (ref 30.0–36.0)
MCV: 83.6 fL (ref 80.0–100.0)
Monocytes Absolute: 0.5 10*3/uL (ref 0.1–1.0)
Monocytes Relative: 6 %
Neutro Abs: 4.3 10*3/uL (ref 1.7–7.7)
Neutrophils Relative %: 53 %
Platelets: 376 10*3/uL (ref 150–400)
RBC: 5.79 MIL/uL — ABNORMAL HIGH (ref 3.87–5.11)
RDW: 13.6 % (ref 11.5–15.5)
WBC: 8.1 10*3/uL (ref 4.0–10.5)
nRBC: 0 % (ref 0.0–0.2)

## 2022-09-11 LAB — BASIC METABOLIC PANEL
Anion gap: 12 (ref 5–15)
BUN: 11 mg/dL (ref 6–20)
CO2: 24 mmol/L (ref 22–32)
Calcium: 9.3 mg/dL (ref 8.9–10.3)
Chloride: 101 mmol/L (ref 98–111)
Creatinine, Ser: 0.5 mg/dL (ref 0.44–1.00)
GFR, Estimated: >60 mL/min (ref >60)
Glucose, Bld: 144 mg/dL — ABNORMAL HIGH (ref 70–99)
Potassium: 3.6 mmol/L (ref 3.5–5.1)
Sodium: 137 mmol/L (ref 135–145)

## 2022-09-11 LAB — PROTEIN AND GLUCOSE, CSF
Glucose, CSF: 79 mg/dL — ABNORMAL HIGH (ref 40–70)
Total  Protein, CSF: 29 mg/dL (ref 15–45)

## 2022-09-11 MED ORDER — ONDANSETRON 4 MG PO TBDP
8.0000 mg | ORAL_TABLET | Freq: Once | ORAL | Status: AC
  Administered 2022-09-11: 8 mg via ORAL
  Filled 2022-09-11: qty 2

## 2022-09-11 MED ORDER — IOHEXOL 350 MG/ML SOLN
75.0000 mL | Freq: Once | INTRAVENOUS | Status: AC | PRN
  Administered 2022-09-11: 75 mL via INTRAVENOUS

## 2022-09-11 MED ORDER — LIDOCAINE HCL (PF) 1 % IJ SOLN
5.0000 mL | Freq: Once | INTRAMUSCULAR | Status: AC
  Administered 2022-09-11: 5 mL via INTRADERMAL

## 2022-09-11 MED ORDER — LIDOCAINE HCL (PF) 1 % IJ SOLN
INTRAMUSCULAR | Status: AC
  Filled 2022-09-11: qty 5

## 2022-09-11 MED ORDER — TRAMADOL HCL 50 MG PO TABS
50.0000 mg | ORAL_TABLET | Freq: Once | ORAL | Status: AC
  Administered 2022-09-11: 50 mg via ORAL
  Filled 2022-09-11: qty 1

## 2022-09-11 MED ORDER — ACETAMINOPHEN 500 MG PO TABS
1000.0000 mg | ORAL_TABLET | Freq: Once | ORAL | Status: AC
  Administered 2022-09-11: 1000 mg via ORAL
  Filled 2022-09-11: qty 2

## 2022-09-11 NOTE — ED Notes (Signed)
Vomited upon return to room via stretcher from MRI. Vomited x1, then felt better. Denies current nausea.

## 2022-09-11 NOTE — ED Provider Notes (Signed)
[facility] EMERGENCY DEPARTMENT Provider Note   CSN: [id number] Arrival date & time: 09/11/22  0210     History  Chief Complaint  Patient presents with   Headache    Kelly Gregory is a 36 y.o. female.  Pt c/o headache. Symptoms acute onset last pm, dull, severe, esp left sided. States occurred or got worse two times. First time at rest/no specific activity, 2nd time during intercourse, + Nausea. No vomiting. Denies hx frequent headaches or migraines. No associated change in speech. ?transient 'blurry vision', no current visual c/o.  no numbness/weakness or loss of normal functional ability. No problems w balance/walking. Denies sinus congestion, drainage or pain. No sore throat, cough or uri symptoms. No fever or chills. No eye pain. No neck stiffness.  No recent head trauma. No syncope.   The history is provided by the patient, a significant other and medical records.       Home Medications Prior to Admission medications   Medication Sig Start Date End Date Taking? Authorizing Provider  etanercept (ENBREL) 50 MG/ML injection Inject 50 mg into the skin every 14 (fourteen) days.    [provider]  medroxyPROGESTERone (PROVERA) 5 MG tablet Take by mouth. 03/16/22   [provider]  metFORMIN (GLUCOPHAGE-XR) 500 MG 24 hr tablet TAKE 1 TABLET BY MOUTH DAILY WITH BREAKFAST FOR DIABETES 07/29/22   Doreene Nest, NP  NIFEdipine (PROCARDIA-XL/NIFEDICAL-XL) 30 MG 24 hr tablet TAKE 1 TABLET BY MOUTH EVERY DAY FOR BLOOD PRESSURE 02/02/22   Doreene Nest, NP  pravastatin (PRAVACHOL) 40 MG tablet TAKE 1 TABLET BY MOUTH EVERY DAY FOR CHOLESTEROL 09/10/22   Doreene Nest, NP      Allergies    Azithromycin, Gatifloxacin, Tequin, and Penicillins    Review of Systems   Review of Systems  Constitutional:  Negative for chills and fever.  HENT:  Negative for congestion, sinus pain and sore throat.   Eyes:  Negative for pain, redness and visual  disturbance.  Respiratory:  Negative for cough and shortness of breath.   Cardiovascular:  Negative for chest pain.  Gastrointestinal:  Positive for nausea. Negative for abdominal pain, diarrhea and vomiting.  Genitourinary:  Negative for flank pain.  Musculoskeletal:  Negative for neck pain and neck stiffness.  Skin:  Negative for rash.  Neurological:  Positive for headaches. Negative for syncope, speech difficulty, weakness and numbness.  Hematological:  Does not bruise/bleed easily.  Psychiatric/Behavioral:  Negative for confusion.     Physical Exam Updated Vital Signs BP (!) 129/98   Pulse 85   Temp 98.1 F (36.7 C)   Resp 18   LMP 09/05/2022   SpO2 99%  Physical Exam Vitals and nursing note reviewed.  Constitutional:      Appearance: Normal appearance. She is well-developed.  HENT:     Head: Atraumatic.     Comments: No sinus or temporal tenderness.     Nose: Nose normal.     Mouth/Throat:     Mouth: Mucous membranes are moist.  Eyes:     General: No scleral icterus.    Conjunctiva/sclera: Conjunctivae normal.     Pupils: Pupils are equal, round, and reactive to light.  Neck:     Trachea: No tracheal deviation.     Comments: No stiffness or rigidity.  Cardiovascular:     Rate and Rhythm: Normal rate and regular rhythm.     Pulses: Normal pulses.     Heart sounds: Normal heart sounds. No murmur  heard.    No friction rub. No gallop.  Pulmonary:     Effort: Pulmonary effort is normal. No respiratory distress.     Breath sounds: Normal breath sounds.  Abdominal:     General: Bowel sounds are normal. There is no distension.     Palpations: Abdomen is soft.     Tenderness: There is no abdominal tenderness.  Genitourinary:    Comments: No cva tenderness.  Musculoskeletal:        General: No swelling.     Cervical back: Normal range of motion and neck supple. No rigidity. No muscular tenderness.     Right lower leg: No edema.     Left lower leg: No edema.   Skin:    General: Skin is warm and dry.     Findings: No rash.  Neurological:     Mental Status: She is alert.     Comments: Alert, speech normal. Motor/sens grossly intact bil. No pronator drift. Steady gait.  Psychiatric:        Mood and Affect: Mood normal.     ED Results / Procedures / Treatments   Labs (all labs ordered are listed, but only abnormal results are displayed) Results for orders placed or performed during the hospital encounter of 09/11/22  Basic metabolic panel  Result Value Ref Range   Sodium 137 135 - 145 mmol/L   Potassium 3.6 3.5 - 5.1 mmol/L   Chloride 101 98 - 111 mmol/L   CO2 24 22 - 32 mmol/L   Glucose, Bld 144 (H) 70 - 99 mg/dL   BUN 11 6 - 20 mg/dL   Creatinine, Ser 5.05 0.44 - 1.00 mg/dL   Calcium 9.3 8.9 - 39.7 mg/dL   GFR, Estimated >67 >34 mL/min   Anion gap 12 5 - 15  CBC with Differential  Result Value Ref Range   WBC 8.1 4.0 - 10.5 K/uL   RBC 5.79 (H) 3.87 - 5.11 MIL/uL   Hemoglobin 16.0 (H) 12.0 - 15.0 g/dL   HCT 19.3 (H) 79.0 - 24.0 %   MCV 83.6 80.0 - 100.0 fL   MCH 27.6 26.0 - 34.0 pg   MCHC 33.1 30.0 - 36.0 g/dL   RDW 97.3 53.2 - 99.2 %   Platelets 376 150 - 400 K/uL   nRBC 0.0 0.0 - 0.2 %   Neutrophils Relative % 53 %   Neutro Abs 4.3 1.7 - 7.7 K/uL   Lymphocytes Relative 39 %   Lymphs Abs 3.1 0.7 - 4.0 K/uL   Monocytes Relative 6 %   Monocytes Absolute 0.5 0.1 - 1.0 K/uL   Eosinophils Relative 2 %   Eosinophils Absolute 0.1 0.0 - 0.5 K/uL   Basophils Relative 0 %   Basophils Absolute 0.0 0.0 - 0.1 K/uL   Immature Granulocytes 0 %   Abs Immature Granulocytes 0.02 0.00 - 0.07 K/uL  CSF cell count with differential collection tube #: 1 and 4  Result Value Ref Range   Tube # 1 and 4    Color, CSF RED (A) COLORLESS   Appearance, CSF HAZY (A) CLEAR   Supernatant NOT INDICATED    RBC Count, CSF 11,000 (H) 0 /cu mm   WBC, CSF 2 0 - 5 /cu mm   Other Cells, CSF TOO FEW TO COUNT, SMEAR AVAILABLE FOR REVIEW   CSF cell count  with differential collection tube #: 1 and 4  Result Value Ref Range   Tube # 1 and 4  Color, CSF RED (A) COLORLESS   Appearance, CSF CLOUDY (A) CLEAR   Supernatant NOT INDICATED    RBC Count, CSF 22,500 (H) 0 /cu mm   WBC, CSF 6 (H) 0 - 5 /cu mm   Other Cells, CSF TOO FEW TO COUNT, SMEAR AVAILABLE FOR REVIEW   Protein and glucose, CSF  Result Value Ref Range   Glucose, CSF 79 (H) 40 - 70 mg/dL   Total  Protein, CSF 29 15 - 45 mg/dL   CT ANGIO HEAD NECK W WO CM  Result Date: 09/11/2022 CLINICAL DATA:  36 year old female with worst headache of life, sudden onset. Blurred vision. EXAM: CT ANGIOGRAPHY HEAD AND NECK TECHNIQUE: Multidetector CT imaging of the head and neck was performed using the standard protocol during bolus administration of intravenous contrast. Multiplanar CT image reconstructions and MIPs were obtained to evaluate the vascular anatomy. Carotid stenosis measurements (when applicable) are obtained utilizing NASCET criteria, using the distal internal carotid diameter as the denominator. RADIATION DOSE REDUCTION: This exam was performed according to the departmental dose-optimization program which includes automated exposure control, adjustment of the mA and/or kV according to patient size and/or use of iterative reconstruction technique. CONTRAST:  11mL OMNIPAQUE IOHEXOL 350 MG/ML SOLN COMPARISON:  Plain head CT 0347 hours today. FINDINGS: CTA NECK Skeleton: Negative. Upper chest: Patchy bilateral dependent pulmonary opacity and/or mosaic attenuation. Negative visible superior mediastinum, trachea. Other neck: Negative; no acute neck soft tissue finding. Aortic arch: 3 vessel arch configuration with no arch atherosclerosis. Right carotid system: Mildly tortuous brachiocephalic artery and proximal right CCA with no plaque or stenosis. Mild streak artifact related to right IJ venous contrast reflux. Negative right carotid bifurcation and cervical right ICA. Left carotid system: Mild  tortuosity but otherwise negative. Vertebral arteries: Right subclavian venous contrast streak artifact but the proximal right subclavian artery and vertebral artery origin appear normal. Right vertebral artery appears patent and within normal limits to the skull base. Proximal left subclavian artery and left vertebral artery origin are normal. Dominant left vertebral artery is patent and normal to the skull base. CTA HEAD Posterior circulation: Right vertebral V4 segment is diminutive beyond the normal PICA origin. Left V4 is dominant. Left PICA origin is patent. No distal vertebral or vertebrobasilar junction stenosis. Patent basilar artery, AICA origins, SCA and PCA origins. Posterior communicating arteries are diminutive or absent. Bilateral PCA branches are within normal limits. Anterior circulation: Both ICA siphons are patent. No siphon plaque or stenosis. Ophthalmic artery origins appear normal. Patent carotid termini, MCA and ACA origins. Dominant left and diminutive right ACA A1 segments. Azygous ACA A2 anatomy. ACA branches are within normal limits. Left MCA M1 segment is within normal limits, diverticulum of the lenticulostriate vessels is suspected on series 11, image 15 (normal variant) this is about 2 mm. Left MCA bifurcation and branches are within normal limits. Right MCA M1 segment bifurcates early without stenosis. Right MCA branches are within normal limits. Venous sinuses: Patent. Right transverse and sigmoid venous sinuses are dominant. Anatomic variants: Mildly dominant left vertebral artery, especially the V4 segment. Dominant left ACA A1 and azygous ACA A2 anatomy. Review of the MIP images confirms the above findings IMPRESSION: 1. Essentially normal CTA Head and Neck; - no atherosclerosis or stenosis. - several vascular anatomic variations. Including a suspected Left MCA lenticulostriate artery infundibulum whereas alternatively a 2 mm Left M1 aneurysm is felt unlikely. But surveillance  with Head MRA (noncontrast, no ionizing radiation) can ensue to document stability. 2. Patchy nonspecific  upper lobe pulmonary opacity. Favor atelectasis over infection or edema. Electronically Signed   By: Genevie Ann M.D.   On: 09/11/2022 05:54   CT Head Wo Contrast  Result Date: 09/11/2022 CLINICAL DATA:  Headache EXAM: CT HEAD WITHOUT CONTRAST TECHNIQUE: Contiguous axial images were obtained from the base of the skull through the vertex without intravenous contrast. RADIATION DOSE REDUCTION: This exam was performed according to the departmental dose-optimization program which includes automated exposure control, adjustment of the mA and/or kV according to patient size and/or use of iterative reconstruction technique. COMPARISON:  None Available. FINDINGS: Brain: There is no mass, hemorrhage or extra-axial collection. The size and configuration of the ventricles and extra-axial CSF spaces are normal. The brain parenchyma is normal, without acute or chronic infarction. Vascular: No abnormal hyperdensity of the major intracranial arteries or dural venous sinuses. No intracranial atherosclerosis. Skull: The visualized skull base, calvarium and extracranial soft tissues are normal. Sinuses/Orbits: No fluid levels or advanced mucosal thickening of the visualized paranasal sinuses. No mastoid or middle ear effusion. The orbits are normal. IMPRESSION: Normal head CT. Electronically Signed   By: Ulyses Jarred M.D.   On: 09/11/2022 03:57    EKG None  Radiology CT ANGIO HEAD NECK W WO CM  Result Date: 09/11/2022 CLINICAL DATA:  36 year old female with worst headache of life, sudden onset. Blurred vision. EXAM: CT ANGIOGRAPHY HEAD AND NECK TECHNIQUE: Multidetector CT imaging of the head and neck was performed using the standard protocol during bolus administration of intravenous contrast. Multiplanar CT image reconstructions and MIPs were obtained to evaluate the vascular anatomy. Carotid stenosis measurements  (when applicable) are obtained utilizing NASCET criteria, using the distal internal carotid diameter as the denominator. RADIATION DOSE REDUCTION: This exam was performed according to the departmental dose-optimization program which includes automated exposure control, adjustment of the mA and/or kV according to patient size and/or use of iterative reconstruction technique. CONTRAST:  53mL OMNIPAQUE IOHEXOL 350 MG/ML SOLN COMPARISON:  Plain head CT 0347 hours today. FINDINGS: CTA NECK Skeleton: Negative. Upper chest: Patchy bilateral dependent pulmonary opacity and/or mosaic attenuation. Negative visible superior mediastinum, trachea. Other neck: Negative; no acute neck soft tissue finding. Aortic arch: 3 vessel arch configuration with no arch atherosclerosis. Right carotid system: Mildly tortuous brachiocephalic artery and proximal right CCA with no plaque or stenosis. Mild streak artifact related to right IJ venous contrast reflux. Negative right carotid bifurcation and cervical right ICA. Left carotid system: Mild tortuosity but otherwise negative. Vertebral arteries: Right subclavian venous contrast streak artifact but the proximal right subclavian artery and vertebral artery origin appear normal. Right vertebral artery appears patent and within normal limits to the skull base. Proximal left subclavian artery and left vertebral artery origin are normal. Dominant left vertebral artery is patent and normal to the skull base. CTA HEAD Posterior circulation: Right vertebral V4 segment is diminutive beyond the normal PICA origin. Left V4 is dominant. Left PICA origin is patent. No distal vertebral or vertebrobasilar junction stenosis. Patent basilar artery, AICA origins, SCA and PCA origins. Posterior communicating arteries are diminutive or absent. Bilateral PCA branches are within normal limits. Anterior circulation: Both ICA siphons are patent. No siphon plaque or stenosis. Ophthalmic artery origins appear normal.  Patent carotid termini, MCA and ACA origins. Dominant left and diminutive right ACA A1 segments. Azygous ACA A2 anatomy. ACA branches are within normal limits. Left MCA M1 segment is within normal limits, diverticulum of the lenticulostriate vessels is suspected on series 11, image 15 (normal variant) this  is about 2 mm. Left MCA bifurcation and branches are within normal limits. Right MCA M1 segment bifurcates early without stenosis. Right MCA branches are within normal limits. Venous sinuses: Patent. Right transverse and sigmoid venous sinuses are dominant. Anatomic variants: Mildly dominant left vertebral artery, especially the V4 segment. Dominant left ACA A1 and azygous ACA A2 anatomy. Review of the MIP images confirms the above findings IMPRESSION: 1. Essentially normal CTA Head and Neck; - no atherosclerosis or stenosis. - several vascular anatomic variations. Including a suspected Left MCA lenticulostriate artery infundibulum whereas alternatively a 2 mm Left M1 aneurysm is felt unlikely. But surveillance with Head MRA (noncontrast, no ionizing radiation) can ensue to document stability. 2. Patchy nonspecific upper lobe pulmonary opacity. Favor atelectasis over infection or edema. Electronically Signed   By: Genevie Ann M.D.   On: 09/11/2022 05:54   CT Head Wo Contrast  Result Date: 09/11/2022 CLINICAL DATA:  Headache EXAM: CT HEAD WITHOUT CONTRAST TECHNIQUE: Contiguous axial images were obtained from the base of the skull through the vertex without intravenous contrast. RADIATION DOSE REDUCTION: This exam was performed according to the departmental dose-optimization program which includes automated exposure control, adjustment of the mA and/or kV according to patient size and/or use of iterative reconstruction technique. COMPARISON:  None Available. FINDINGS: Brain: There is no mass, hemorrhage or extra-axial collection. The size and configuration of the ventricles and extra-axial CSF spaces are normal.  The brain parenchyma is normal, without acute or chronic infarction. Vascular: No abnormal hyperdensity of the major intracranial arteries or dural venous sinuses. No intracranial atherosclerosis. Skull: The visualized skull base, calvarium and extracranial soft tissues are normal. Sinuses/Orbits: No fluid levels or advanced mucosal thickening of the visualized paranasal sinuses. No mastoid or middle ear effusion. The orbits are normal. IMPRESSION: Normal head CT. Electronically Signed   By: Ulyses Jarred M.D.   On: 09/11/2022 03:57    Procedures Procedures    Medications Ordered in ED Medications  ondansetron (ZOFRAN-ODT) disintegrating tablet 8 mg (has no administration in time range)  acetaminophen (TYLENOL) tablet 1,000 mg (has no administration in time range)  traMADol (ULTRAM) tablet 50 mg (has no administration in time range)  iohexol (OMNIPAQUE) 350 MG/ML injection 75 mL (75 mLs Intravenous Contrast Given 09/11/22 0541)    ED Course/ Medical Decision Making/ A&P                           Medical Decision Making Problems Addressed: Elevated blood pressure reading: acute illness or injury Essential hypertension: chronic illness or injury with exacerbation, progression, or side effects of treatment that poses a threat to life or bodily functions Generalized headache: acute illness or injury with systemic symptoms that poses a threat to life or bodily functions  Amount and/or Complexity of Data Reviewed Independent Historian:     Details: Family, hx External Data Reviewed: notes. Labs: ordered. Decision-making details documented in ED Course. Radiology: ordered and independent interpretation performed. Decision-making details documented in ED Course.  Risk OTC drugs. Prescription drug management. Decision regarding hospitalization.   Iv ns. Continuous pulse ox and cardiac monitoring. Labs ordered/sent. Imaging ordered.   Diff dx includes sah, migraine, tension headache, etc  - dispo decision including potential need for admission considered - will get labs and imaging and reassess.   Reviewed nursing notes and prior charts for additional history. External reports reviewed. Additional history from:  Cardiac monitor: sinus rhythm, rate 85.  Labs reviewed/interpreted by me -  chem normal.   CT reviewed/interpreted by me - no hem.   Given acute/abrupt/new headache, nausea, blurry vision, etc - will get LP r/o SAH. Unable to obtain sample during bedside attempt (sterile technique used).  Will get fluoro/LP.   Recheck, pt comfortable appearing. Await results of csf.   Additional labs reviewed/interpreted by me - csf with tube 1 22K rbc and other tube with 11K - NS consulted re cta and csf results in setting acute/abrupt/severe head pain.  1650 NS consult pending - pt signed out to Dr Tamera Punt.          Final Clinical Impression(s) / ED Diagnoses Final diagnoses:  None    Rx / DC Orders ED Discharge Orders     None         Lajean Saver, MD 09/11/22 1651

## 2022-09-11 NOTE — Consult Note (Signed)
Reason for Consult: Sudden onset headache rule out subarachnoid hemorrhage Referring Physician: Emergency department  Kelly Gregory is an 36 y.o. female.  HPI: 37 year old female with sudden onset headache twice yesterday evening 1 was at rest at 9 AM the other was following intercourse at midnight.  Denies any loss of consciousness denies any vomiting was associated with nausea.  The headache is some better she has been here since about 1 AM getting worked up with a CT CT angiogram and lumbar puncture.  CT was negative for hemorrhage CT angiogram showed possible normal variant in the left MCA M1 segment versus infundibulum versus small 2 mm aneurysm.  It was felt the aneurysm was very unlikely but patient did undergo lumbar puncture which was traumatic in nature tubes have cleared from 22,000 red cells in the first tube to 11,000 in the fourth tube however still a fair amount of blood.  Discussed this extensively with our vascular neurosurgeon and we feel that MRI scan to rule out subarachnoid hemorrhage could facilitate outpatient management with possible discharge and schedule follow-up versus admitting for angiography.  I extensively explained this to the patient and family.  Past Medical History:  Diagnosis Date   Borderline diabetes    Cholelithiasis with acute cholecystitis 08/21/2019   DM (diabetes mellitus) (HCC)    Elevated blood pressure    no meds   Gestational diabetes    Hypertension    Rheumatoid arthritis (HCC)    Years ago, does not follow, no meds   S/P repeat low transverse C-section 03/27/2019   Seasonal allergies    Syncope    As a child, no problems during adulthood.    Past Surgical History:  Procedure Laterality Date   CESAREAN SECTION  04/01/2011   Procedure: CESAREAN SECTION;  Surgeon: Oliver Pila;  Location: [facility];  Service: Gynecology;  Laterality: N/A;   CESAREAN SECTION N/A 05/23/2013   Procedure: CESAREAN SECTION;  Surgeon: Lavina Hamman, MD;  Location:  [facility];  Service: Obstetrics;  Laterality: N/A;   CESAREAN SECTION N/A 03/27/2019   Procedure: CESAREAN SECTION;  Surgeon: Huel Cote, MD;  Location: [facility];  Service: Obstetrics;  Laterality: N/AHerbert Seta,  RNFA   CHOLECYSTECTOMY N/A 08/21/2019   Procedure: LAPAROSCOPIC CHOLECYSTECTOMY with IOC;  Surgeon: Manus Rudd, MD;  Location: [facility];  Service: General;  Laterality: N/A;   CYSTECTOMY Left    removed from underarm    DILATION AND EVACUATION N/A 02/01/2018   Procedure: DILATATION AND EVACUATION;  Surgeon: Huel Cote, MD;  Location: [facility];  Service: Gynecology;  Laterality: N/A;   TOOTH EXTRACTION     wisdom teeth ext    Family History  Adopted: Yes  Problem Relation Age of Onset   Other Other        Adopted    Social History:  reports that she has never smoked. She has never used smokeless tobacco. She reports that she does not drink alcohol and does not use drugs.  Allergies:  Allergies  Allergen Reactions   Azithromycin Nausea And Vomiting   Gatifloxacin Nausea And Vomiting and Other (See Comments)   Tequin Nausea And Vomiting   Penicillins Hives, Rash and Other (See Comments)    Has patient had a PCN reaction causing immediate rash, facial/tongue/throat swelling, SOB or lightheadedness with hypotension: Unknown Has patient had a PCN reaction causing severe rash involving mucus membranes or skin necrosis: Unknown Has patient had a PCN reaction that required hospitalization: Unknown Has patient had a  PCN reaction occurring within the last 10 years: No If all of the above answers are "NO", then may proceed with Cephalosporin use.     Medications: I have reviewed the patient's current medications.  Results for orders placed or performed during the hospital encounter of 09/11/22 (from the past 48 hour(s))  Basic metabolic panel     Status: Abnormal   Collection Time: 09/11/22  3:42 AM  Result Value Ref Range   Sodium 137 135 - 145 mmol/L   Potassium  3.6 3.5 - 5.1 mmol/L   Chloride 101 98 - 111 mmol/L   CO2 24 22 - 32 mmol/L   Glucose, Bld 144 (H) 70 - 99 mg/dL    Comment: Glucose reference range applies only to samples taken after fasting for at least 8 hours.   BUN 11 6 - 20 mg/dL   Creatinine, Ser 2.11 0.44 - 1.00 mg/dL   Calcium 9.3 8.9 - 94.1 mg/dL   GFR, Estimated >74 >08 mL/min    Comment: (NOTE) Calculated using the CKD-EPI Creatinine Equation (2021)    Anion gap 12 5 - 15    Comment: Performed at [facility], [address], [place], [place] [address]  CBC with Differential     Status: Abnormal   Collection Time: 09/11/22  3:42 AM  Result Value Ref Range   WBC 8.1 4.0 - 10.5 K/uL   RBC 5.79 (H) 3.87 - 5.11 MIL/uL   Hemoglobin 16.0 (H) 12.0 - 15.0 g/dL   HCT 85.6 (H) 31.4 - 97.0 %   MCV 83.6 80.0 - 100.0 fL   MCH 27.6 26.0 - 34.0 pg   MCHC 33.1 30.0 - 36.0 g/dL   RDW 26.3 78.5 - 88.5 %   Platelets 376 150 - 400 K/uL   nRBC 0.0 0.0 - 0.2 %   Neutrophils Relative % 53 %   Neutro Abs 4.3 1.7 - 7.7 K/uL   Lymphocytes Relative 39 %   Lymphs Abs 3.1 0.7 - 4.0 K/uL   Monocytes Relative 6 %   Monocytes Absolute 0.5 0.1 - 1.0 K/uL   Eosinophils Relative 2 %   Eosinophils Absolute 0.1 0.0 - 0.5 K/uL   Basophils Relative 0 %   Basophils Absolute 0.0 0.0 - 0.1 K/uL   Immature Granulocytes 0 %   Abs Immature Granulocytes 0.02 0.00 - 0.07 K/uL    Comment: Performed at [facility], [address], [place], [place] [address]  CSF cell count with differential collection tube #: 1 and 4     Status: Abnormal   Collection Time: 09/11/22  1:35 PM  Result Value Ref Range   Tube # 1 and 4    Color, CSF RED (A) COLORLESS   Appearance, CSF HAZY (A) CLEAR   Supernatant NOT INDICATED    RBC Count, CSF 11,000 (H) 0 /cu mm   WBC, CSF 2 0 - 5 /cu mm   Other Cells, CSF TOO FEW TO COUNT, SMEAR AVAILABLE FOR REVIEW     Comment: OCCASIONAL LYMPHOCYTE AND NEUTROPHIL Performed at [facility], [address],  [place], [place] [address]   CSF cell count with differential collection tube #: 1 and 4     Status: Abnormal   Collection Time: 09/11/22  1:35 PM  Result Value Ref Range   Tube # 1 and 4    Color, CSF RED (A) COLORLESS   Appearance, CSF CLOUDY (A) CLEAR   Supernatant NOT INDICATED  RBC Count, CSF 22,500 (H) 0 /cu mm   WBC, CSF 6 (H) 0 - 5 /cu mm   Other Cells, CSF TOO FEW TO COUNT, SMEAR AVAILABLE FOR REVIEW     Comment: OCCASIONAL LYMPHOCYTES AND NEUTROPHILS Performed at [facility], [address], [place], [place] [address]   Protein and glucose, CSF     Status: Abnormal   Collection Time: 09/11/22  1:35 PM  Result Value Ref Range   Glucose, CSF 79 (H) 40 - 70 mg/dL   Total  Protein, CSF 29 15 - 45 mg/dL    Comment: Performed at [facility], [address] [place], [place] [address]    DG FL GUIDED LUMBAR PUNCTURE  Result Date: 09/11/2022 CLINICAL DATA:  Patient presents to ED with complaints of severe headache. Request received for fluoroscopic guided lumbar puncture to rule out SAH. EXAM: DIAGNOSTIC LUMBAR PUNCTURE UNDER FLUOROSCOPIC GUIDANCE COMPARISON:  None Available. FLUOROSCOPY: Radiation Exposure Index (as provided by the fluoroscopic device): 19.50 mGy Kerma PROCEDURE: Informed consent was obtained from the patient prior to the procedure, including potential complications of headache, allergy, and pain. With the patient prone, the lower back was prepped with Betadine. 1% Lidocaine was used for local anesthesia. Lumbar puncture was initially attempted at L3-L4 with return of dark bloody fluid. The needle was removed. Puncture was then performed at the L4-L5 level using a 20 gauge needle with return of clear, blood-tinged CSF. Patient was rolled into the left lateral decubitus position. Opening pressure was 18 cm water. 6 ml of CSF were obtained for laboratory studies. The patient tolerated the procedure well and there were no apparent complications. Procedure was  performed by Alex Gardener, NP and supervised by Sebastian Ache, MD. IMPRESSION: Technically successful fluoroscopically guided diagnostic lumbar puncture. Read by: Alex Gardener, AGNP-BC Electronically Signed   By: Sebastian Ache M.D.   On: 09/11/2022 14:04   CT ANGIO HEAD NECK W WO CM  Result Date: 09/11/2022 CLINICAL DATA:  36 year old female with worst headache of life, sudden onset. Blurred vision. EXAM: CT ANGIOGRAPHY HEAD AND NECK TECHNIQUE: Multidetector CT imaging of the head and neck was performed using the standard protocol during bolus administration of intravenous contrast. Multiplanar CT image reconstructions and MIPs were obtained to evaluate the vascular anatomy. Carotid stenosis measurements (when applicable) are obtained utilizing NASCET criteria, using the distal internal carotid diameter as the denominator. RADIATION DOSE REDUCTION: This exam was performed according to the departmental dose-optimization program which includes automated exposure control, adjustment of the mA and/or kV according to patient size and/or use of iterative reconstruction technique. CONTRAST:  78mL OMNIPAQUE IOHEXOL 350 MG/ML SOLN COMPARISON:  Plain head CT 0347 hours today. FINDINGS: CTA NECK Skeleton: Negative. Upper chest: Patchy bilateral dependent pulmonary opacity and/or mosaic attenuation. Negative visible superior mediastinum, trachea. Other neck: Negative; no acute neck soft tissue finding. Aortic arch: 3 vessel arch configuration with no arch atherosclerosis. Right carotid system: Mildly tortuous brachiocephalic artery and proximal right CCA with no plaque or stenosis. Mild streak artifact related to right IJ venous contrast reflux. Negative right carotid bifurcation and cervical right ICA. Left carotid system: Mild tortuosity but otherwise negative. Vertebral arteries: Right subclavian venous contrast streak artifact but the proximal right subclavian artery and vertebral artery origin appear normal. Right  vertebral artery appears patent and within normal limits to the skull base. Proximal left subclavian artery and left vertebral artery origin are normal. Dominant left vertebral artery is patent and normal to the skull  base. CTA HEAD Posterior circulation: Right vertebral V4 segment is diminutive beyond the normal PICA origin. Left V4 is dominant. Left PICA origin is patent. No distal vertebral or vertebrobasilar junction stenosis. Patent basilar artery, AICA origins, SCA and PCA origins. Posterior communicating arteries are diminutive or absent. Bilateral PCA branches are within normal limits. Anterior circulation: Both ICA siphons are patent. No siphon plaque or stenosis. Ophthalmic artery origins appear normal. Patent carotid termini, MCA and ACA origins. Dominant left and diminutive right ACA A1 segments. Azygous ACA A2 anatomy. ACA branches are within normal limits. Left MCA M1 segment is within normal limits, diverticulum of the lenticulostriate vessels is suspected on series 11, image 15 (normal variant) this is about 2 mm. Left MCA bifurcation and branches are within normal limits. Right MCA M1 segment bifurcates early without stenosis. Right MCA branches are within normal limits. Venous sinuses: Patent. Right transverse and sigmoid venous sinuses are dominant. Anatomic variants: Mildly dominant left vertebral artery, especially the V4 segment. Dominant left ACA A1 and azygous ACA A2 anatomy. Review of the MIP images confirms the above findings IMPRESSION: 1. Essentially normal CTA Head and Neck; - no atherosclerosis or stenosis. - several vascular anatomic variations. Including a suspected Left MCA lenticulostriate artery infundibulum whereas alternatively a 2 mm Left M1 aneurysm is felt unlikely. But surveillance with Head MRA (noncontrast, no ionizing radiation) can ensue to document stability. 2. Patchy nonspecific upper lobe pulmonary opacity. Favor atelectasis over infection or edema. Electronically  Signed   By: Odessa Fleming M.D.   On: 09/11/2022 05:54   CT Head Wo Contrast  Result Date: 09/11/2022 CLINICAL DATA:  Headache EXAM: CT HEAD WITHOUT CONTRAST TECHNIQUE: Contiguous axial images were obtained from the base of the skull through the vertex without intravenous contrast. RADIATION DOSE REDUCTION: This exam was performed according to the departmental dose-optimization program which includes automated exposure control, adjustment of the mA and/or kV according to patient size and/or use of iterative reconstruction technique. COMPARISON:  None Available. FINDINGS: Brain: There is no mass, hemorrhage or extra-axial collection. The size and configuration of the ventricles and extra-axial CSF spaces are normal. The brain parenchyma is normal, without acute or chronic infarction. Vascular: No abnormal hyperdensity of the major intracranial arteries or dural venous sinuses. No intracranial atherosclerosis. Skull: The visualized skull base, calvarium and extracranial soft tissues are normal. Sinuses/Orbits: No fluid levels or advanced mucosal thickening of the visualized paranasal sinuses. No mastoid or middle ear effusion. The orbits are normal. IMPRESSION: Normal head CT. Electronically Signed   By: Deatra Robinson M.D.   On: 09/11/2022 03:57    Review of Systems  Neurological:  Positive for headaches.   Blood pressure (!) 133/92, pulse 73, temperature 98.2 F (36.8 C), temperature source Oral, resp. rate 18, last menstrual period 09/10/2022, SpO2 98 %, unknown if currently breastfeeding. Physical Exam Neurological:     Comments: Awake alert pupils equal extract movements are intact strength is 5 out of 5 upper and lower extremities with no pronator drift cranial nerves are intact     Assessment/Plan: 36 year old with sudden onset headache workup has been equivocal so far no definitive aneurysm although suspicious slight abnormality on CTA and slight abnormality with lumbar puncture although could be  traumatic in nature.  Will obtain MRI scan if negative for subarachnoid hemorrhage feel comfortable with the patient be discharged with scheduled follow-up with Dr. Conchita Paris.  Mariam Dollar 09/11/2022, 5:57 PM

## 2022-09-11 NOTE — Procedures (Signed)
Technically successful fluoro guided LP at L4/L5 level with opening pressure of 18 cm H2O. No closing pressure obtained.  6 cc of clear, blood-tinged CSF sent to lab for analysis.  No immediate post procedural complication.  Please see imaging section of Epic for full dictation.    Alex Gardener, AGNP-BC 09/11/2022, 1:38 PM

## 2022-09-11 NOTE — ED Notes (Signed)
EDP at BS for LP. 

## 2022-09-11 NOTE — ED Notes (Signed)
Pt given water for PO challenge 

## 2022-09-11 NOTE — ED Notes (Signed)
EDP into room, at BS.  ?

## 2022-09-11 NOTE — ED Notes (Signed)
Pt to radiology for LP with fluoro, labels with pt.

## 2022-09-11 NOTE — ED Notes (Signed)
Spoke with Kelly Gregory in lab re: CSF received in radiology during LP by fluoro. CSF received. Order clarified. EDP notified.

## 2022-09-11 NOTE — ED Notes (Addendum)
EDP at [facility]. Family at [facility]. Pt alert, NAD, calm, interactive, resps e/u, speaking in clear complete sentences.

## 2022-09-11 NOTE — ED Notes (Signed)
RN reviewed discharge instructions with pt. Pt verbalized understanding and had no further questions. VSS upon discharge.  

## 2022-09-11 NOTE — ED Provider Triage Note (Signed)
Emergency Medicine Provider Triage Evaluation Note  Kelly Gregory , a 36 y.o. female  was evaluated in triage.  Pt complains of presents with a sudden onset of a headache, states that happened earlier at 9 PM, states happened after intercourse, pain is in the back of her head, is the worst headache of her life, came on suddenly, she states she had slight blurred vision and difficulty moving, she is of the headache has improved but she still has it, not anticoag's around this the past.  Review of Systems  Positive: Headaches, blurred vision Negative: Nausea vomiting  Physical Exam  BP (!) 176/117 (BP Location: Right Arm)   Pulse 97   Temp 98.9 F (37.2 C) (Oral)   Resp 18   LMP 09/05/2022   SpO2 97%  Gen:   Awake, no distress   Resp:  Normal effort  MSK:   Moves extremities without difficulty  Other:  Cranial nerves II through XII grossly intact no difficulty with word finding following two-step commands there is no unilateral weakness present.  Gait fully intact.  Medical Decision Making  Medically screening exam initiated at 3:44 AM.  Appropriate orders placed.  Kelly Gregory was informed that the remainder of the evaluation will be completed by another provider, this initial triage assessment does not replace that evaluation, and the importance of remaining in the ED until their evaluation is complete.  Concern for possible intracranial bleed, will obtain CTA head and neck for rule out of spontaneous bleed.  Lab  been ordered   Kelly Sage, PA-C 09/11/22 [phone]

## 2022-09-11 NOTE — Discharge Instructions (Addendum)
It was our pleasure to provide your ER care today - we hope that you feel better.  Drink plenty of fluids/stay well hydrated. Rest/lie flat for rest of day today.  See post LP instructions.   Take acetaminophen or excedrin as need.  Your CTA study was read as: 1. Essentially normal CTA Head and Neck; - no atherosclerosis or stenosis. - several vascular anatomic variations. Including a suspected Left MCA lenticulostriate artery infundibulum whereas alternatively a 2 mm Left M1 aneurysm is felt unlikely. But surveillance with Head MRA (noncontrast, no ionizing radiation) can ensue to document stability.  Follow up with primary care doctor in the coming week if symptoms fail to improve/resolve - have them review above CT report and arrange recommended follow up.  Return to ER if worse, new symptoms, fevers, new/severe pain, persistent vomiting, weak/faint, or other concern.

## 2022-09-11 NOTE — ED Notes (Addendum)
Ambulatory to b/r, steady gait. Family at side.

## 2022-09-11 NOTE — ED Notes (Signed)
EDP into see 

## 2022-09-11 NOTE — ED Provider Notes (Signed)
Patient care was taken over from Dr. Denton Lank.  Neurosurgery has consulted on the patient.  They overall have a low suspicion of an aneurysmal bleed.  However recommended an MRI to also assess for possible subarachnoid blood.  MRI was performed which does not show any evidence of hemorrhage.  No other acute abnormality.  Patient overall is feeling better.  She has tolerated p.o. fluids without difficulty.  She was given information about making appointment to follow-up with Dr. Conchita Paris.  Return precautions were given.   Rolan Bucco, MD 09/11/22 [phone]

## 2022-09-11 NOTE — ED Notes (Signed)
To MRI

## 2022-09-13 ENCOUNTER — Inpatient Hospital Stay (HOSPITAL_COMMUNITY)
Admission: EM | Admit: 2022-09-13 | Discharge: 2022-09-16 | DRG: 103 | Disposition: A | Discharge status: Home / Self Care | Payer: 59 | Attending: Internal Medicine | Admitting: Internal Medicine

## 2022-09-13 ENCOUNTER — Emergency Department (HOSPITAL_COMMUNITY): Payer: 59

## 2022-09-13 ENCOUNTER — Encounter (HOSPITAL_COMMUNITY): Payer: Self-pay

## 2022-09-13 DIAGNOSIS — Z881 Allergy status to other antibiotic agents status: Secondary | ICD-10-CM | POA: Diagnosis not present

## 2022-09-13 DIAGNOSIS — Z7984 Long term (current) use of oral hypoglycemic drugs: Secondary | ICD-10-CM | POA: Diagnosis not present

## 2022-09-13 DIAGNOSIS — Z880 Allergy status to penicillin: Secondary | ICD-10-CM

## 2022-09-13 DIAGNOSIS — Z9049 Acquired absence of other specified parts of digestive tract: Secondary | ICD-10-CM

## 2022-09-13 DIAGNOSIS — R002 Palpitations: Secondary | ICD-10-CM | POA: Diagnosis present

## 2022-09-13 DIAGNOSIS — Z8632 Personal history of gestational diabetes: Secondary | ICD-10-CM

## 2022-09-13 DIAGNOSIS — G9600 Cerebrospinal fluid leak, unspecified: Secondary | ICD-10-CM | POA: Insufficient documentation

## 2022-09-13 DIAGNOSIS — I10 Essential (primary) hypertension: Secondary | ICD-10-CM | POA: Diagnosis present

## 2022-09-13 DIAGNOSIS — M5481 Occipital neuralgia: Secondary | ICD-10-CM | POA: Insufficient documentation

## 2022-09-13 DIAGNOSIS — F064 Anxiety disorder due to known physiological condition: Secondary | ICD-10-CM | POA: Diagnosis present

## 2022-09-13 DIAGNOSIS — E669 Obesity, unspecified: Secondary | ICD-10-CM | POA: Diagnosis present

## 2022-09-13 DIAGNOSIS — Z6835 Body mass index (BMI) 35.0-35.9, adult: Secondary | ICD-10-CM

## 2022-09-13 DIAGNOSIS — M069 Rheumatoid arthritis, unspecified: Secondary | ICD-10-CM | POA: Diagnosis present

## 2022-09-13 DIAGNOSIS — E876 Hypokalemia: Secondary | ICD-10-CM | POA: Diagnosis present

## 2022-09-13 DIAGNOSIS — G43909 Migraine, unspecified, not intractable, without status migrainosus: Secondary | ICD-10-CM | POA: Diagnosis present

## 2022-09-13 DIAGNOSIS — R51 Headache with orthostatic component, not elsewhere classified: Secondary | ICD-10-CM | POA: Diagnosis not present

## 2022-09-13 DIAGNOSIS — E1165 Type 2 diabetes mellitus with hyperglycemia: Secondary | ICD-10-CM | POA: Diagnosis present

## 2022-09-13 DIAGNOSIS — R112 Nausea with vomiting, unspecified: Secondary | ICD-10-CM

## 2022-09-13 DIAGNOSIS — M1990 Unspecified osteoarthritis, unspecified site: Secondary | ICD-10-CM | POA: Diagnosis present

## 2022-09-13 DIAGNOSIS — G971 Other reaction to spinal and lumbar puncture: Secondary | ICD-10-CM | POA: Diagnosis not present

## 2022-09-13 DIAGNOSIS — D849 Immunodeficiency, unspecified: Secondary | ICD-10-CM | POA: Diagnosis present

## 2022-09-13 DIAGNOSIS — M542 Cervicalgia: Secondary | ICD-10-CM | POA: Diagnosis present

## 2022-09-13 DIAGNOSIS — R519 Headache, unspecified: Principal | ICD-10-CM | POA: Diagnosis present

## 2022-09-13 DIAGNOSIS — E785 Hyperlipidemia, unspecified: Secondary | ICD-10-CM | POA: Diagnosis present

## 2022-09-13 DIAGNOSIS — R292 Abnormal reflex: Secondary | ICD-10-CM | POA: Diagnosis present

## 2022-09-13 DIAGNOSIS — Z888 Allergy status to other drugs, medicaments and biological substances status: Secondary | ICD-10-CM | POA: Diagnosis not present

## 2022-09-13 LAB — DIFFERENTIAL
Abs Immature Granulocytes: 0.02 10*3/uL (ref 0.00–0.07)
Basophils Absolute: 0 10*3/uL (ref 0.0–0.1)
Basophils Relative: 0 %
Eosinophils Absolute: 0 10*3/uL (ref 0.0–0.5)
Eosinophils Relative: 0 %
Immature Granulocytes: 0 %
Lymphocytes Relative: 30 %
Lymphs Abs: 2.6 10*3/uL (ref 0.7–4.0)
Monocytes Absolute: 0.4 10*3/uL (ref 0.1–1.0)
Monocytes Relative: 5 %
Neutro Abs: 5.4 10*3/uL (ref 1.7–7.7)
Neutrophils Relative %: 65 %

## 2022-09-13 LAB — COMPREHENSIVE METABOLIC PANEL
ALT: 83 U/L — ABNORMAL HIGH (ref 0–44)
AST: 35 U/L (ref 15–41)
Albumin: 3.1 g/dL — ABNORMAL LOW (ref 3.5–5.0)
Alkaline Phosphatase: 84 U/L (ref 38–126)
Anion gap: 10 (ref 5–15)
BUN: 8 mg/dL (ref 6–20)
CO2: 21 mmol/L — ABNORMAL LOW (ref 22–32)
Calcium: 8.6 mg/dL — ABNORMAL LOW (ref 8.9–10.3)
Chloride: 104 mmol/L (ref 98–111)
Creatinine, Ser: 0.7 mg/dL (ref 0.44–1.00)
GFR, Estimated: >60 mL/min (ref >60)
Glucose, Bld: 168 mg/dL — ABNORMAL HIGH (ref 70–99)
Potassium: 3.4 mmol/L — ABNORMAL LOW (ref 3.5–5.1)
Sodium: 135 mmol/L (ref 135–145)
Total Bilirubin: 0.5 mg/dL (ref 0.3–1.2)
Total Protein: 7.4 g/dL (ref 6.5–8.1)

## 2022-09-13 LAB — PROTIME-INR
INR: 1 (ref 0.8–1.2)
Prothrombin Time: 13.4 seconds (ref 11.4–15.2)

## 2022-09-13 LAB — CBC
HCT: 45.4 % (ref 36.0–46.0)
Hemoglobin: 15.7 g/dL — ABNORMAL HIGH (ref 12.0–15.0)
MCH: 28.8 pg (ref 26.0–34.0)
MCHC: 34.6 g/dL (ref 30.0–36.0)
MCV: 83.3 fL (ref 80.0–100.0)
Platelets: 348 10*3/uL (ref 150–400)
RBC: 5.45 MIL/uL — ABNORMAL HIGH (ref 3.87–5.11)
RDW: 13.2 % (ref 11.5–15.5)
WBC: 8.4 10*3/uL (ref 4.0–10.5)
nRBC: 0 % (ref 0.0–0.2)

## 2022-09-13 LAB — I-STAT CHEM 8, ED
BUN: 8 mg/dL (ref 6–20)
Calcium, Ion: 1.05 mmol/L — ABNORMAL LOW (ref 1.15–1.40)
Chloride: 103 mmol/L (ref 98–111)
Creatinine, Ser: 0.5 mg/dL (ref 0.44–1.00)
Glucose, Bld: 167 mg/dL — ABNORMAL HIGH (ref 70–99)
HCT: 45 % (ref 36.0–46.0)
Hemoglobin: 15.3 g/dL — ABNORMAL HIGH (ref 12.0–15.0)
Potassium: 3.4 mmol/L — ABNORMAL LOW (ref 3.5–5.1)
Sodium: 138 mmol/L (ref 135–145)
TCO2: 23 mmol/L (ref 22–32)

## 2022-09-13 LAB — CBG MONITORING, ED
Glucose-Capillary: 172 mg/dL — ABNORMAL HIGH (ref 70–99)
Glucose-Capillary: 150 mg/dL — ABNORMAL HIGH (ref 70–99)

## 2022-09-13 LAB — APTT: aPTT: 25 seconds (ref 24–36)

## 2022-09-13 MED ORDER — ACETAMINOPHEN 325 MG PO TABS
650.0000 mg | ORAL_TABLET | Freq: Four times a day (QID) | ORAL | Status: DC | PRN
  Administered 2022-09-14 – 2022-09-16 (×4): 650 mg via ORAL
  Filled 2022-09-13 (×4): qty 2

## 2022-09-13 MED ORDER — DIPHENHYDRAMINE HCL 50 MG/ML IJ SOLN
25.0000 mg | Freq: Once | INTRAMUSCULAR | Status: AC
  Administered 2022-09-13: 25 mg via INTRAVENOUS
  Filled 2022-09-13: qty 1

## 2022-09-13 MED ORDER — HYDROMORPHONE HCL 1 MG/ML IJ SOLN
INTRAMUSCULAR | Status: AC
  Administered 2022-09-13: 1 mg via INTRAVENOUS
  Filled 2022-09-13: qty 1

## 2022-09-13 MED ORDER — ACETAMINOPHEN 650 MG RE SUPP: 650.0000 mg | Freq: Four times a day (QID) | RECTAL | Status: DC | PRN

## 2022-09-13 MED ORDER — PROCHLORPERAZINE EDISYLATE 10 MG/2ML IJ SOLN: 10.0000 mg | INTRAMUSCULAR | Status: DC | PRN

## 2022-09-13 MED ORDER — PROCHLORPERAZINE EDISYLATE 10 MG/2ML IJ SOLN
10.0000 mg | Freq: Once | INTRAMUSCULAR | Status: AC
  Administered 2022-09-13: 10 mg via INTRAVENOUS

## 2022-09-13 MED ORDER — BISACODYL 5 MG PO TBEC: 5.0000 mg | DELAYED_RELEASE_TABLET | Freq: Every day | ORAL | Status: DC | PRN

## 2022-09-13 MED ORDER — ONDANSETRON HCL 4 MG/2ML IJ SOLN: 4.0000 mg | Freq: Four times a day (QID) | INTRAMUSCULAR | Status: DC | PRN

## 2022-09-13 MED ORDER — DEXAMETHASONE SODIUM PHOSPHATE 10 MG/ML IJ SOLN
10.0000 mg | Freq: Once | INTRAMUSCULAR | Status: AC
  Administered 2022-09-13: 10 mg via INTRAVENOUS
  Filled 2022-09-13: qty 1

## 2022-09-13 MED ORDER — SODIUM CHLORIDE 0.9% FLUSH
3.0000 mL | Freq: Once | INTRAVENOUS | Status: AC
  Administered 2022-09-13: 3 mL via INTRAVENOUS

## 2022-09-13 MED ORDER — PROCHLORPERAZINE EDISYLATE 10 MG/2ML IJ SOLN
INTRAMUSCULAR | Status: AC
  Administered 2022-09-13: 10 mg via INTRAVENOUS
  Filled 2022-09-13: qty 2

## 2022-09-13 MED ORDER — CLONIDINE HCL 0.1 MG PO TABS
0.1000 mg | ORAL_TABLET | Freq: Four times a day (QID) | ORAL | Status: DC | PRN
  Administered 2022-09-15: 0.1 mg via ORAL
  Filled 2022-09-13: qty 1

## 2022-09-13 MED ORDER — SUMATRIPTAN SUCCINATE 6 MG/0.5ML ~~LOC~~ SOLN
6.0000 mg | Freq: Once | SUBCUTANEOUS | Status: AC
  Administered 2022-09-13: 6 mg via SUBCUTANEOUS
  Filled 2022-09-13: qty 0.5

## 2022-09-13 MED ORDER — IOHEXOL 350 MG/ML SOLN
75.0000 mL | Freq: Once | INTRAVENOUS | Status: AC | PRN
  Administered 2022-09-13: 75 mL via INTRAVENOUS

## 2022-09-13 MED ORDER — SODIUM CHLORIDE 0.9 % IV BOLUS
2000.0000 mL | Freq: Once | INTRAVENOUS | Status: AC
  Administered 2022-09-13: 2000 mL via INTRAVENOUS

## 2022-09-13 MED ORDER — HYDROMORPHONE HCL 1 MG/ML IJ SOLN: 1.0000 mg | Freq: Once | INTRAMUSCULAR | Status: AC

## 2022-09-13 MED ORDER — POTASSIUM CHLORIDE CRYS ER 20 MEQ PO TBCR
40.0000 meq | EXTENDED_RELEASE_TABLET | Freq: Once | ORAL | Status: AC
  Administered 2022-09-14: 40 meq via ORAL
  Filled 2022-09-13: qty 2

## 2022-09-13 MED ORDER — SODIUM CHLORIDE 0.9 % IV SOLN
500.0000 mg | Freq: Once | INTRAVENOUS | Status: AC
  Administered 2022-09-13: 500 mg via INTRAVENOUS
  Filled 2022-09-13: qty 2

## 2022-09-13 NOTE — H&P (Incomplete)
PCP:   Pleas Koch, NP   Chief Complaint: Severe headache   HPI: This is a 36 year old female with past medical history of rheumatoid arthritis immunosuppressed on Enbrel, hypertension, diabetes mellitus and dyslipidemia.  She has been to the ER twice in the last 48 hours with severe unremitting headache.  On Thursday night.  Went to the bathroom at the sudden onset of a headache that radiated down her neck.  She describes it as someone stabbing her.  It stayed like that for a few minutes and went down, however, it never went away.  At midnight it came back again severe enough to cause her to fall over.  She went to the ER where she developed nausea and vomiting.    In the ER she underwent a battery of testing including CT head, LP r/o SAH,  neurosurgical consult and MRI head.  CT head negative for hemorrhage.  CTA head showed possible normal variant in the left MCA M1 segment versus infundibulum versus small 2 mm aneurysm. Patient's LP was traumatic, can be used to eval for [facility].  MRI head was recommended by NS.  MRI without evidence of hemorrhage.  Patient has been afebrile.  Patient was discharged with follow-up with Dr. Kathyrn Sheriff.  Patient went home and just laid in bed.  Per patient when laying down the headache was dull but when she got up the severe headache resumed.  Her husband states going to the bathroom was very painful. Adding, her HA was so severe it caused her to fall or crumple to her knees.  She took maximal strength Tylenol which did not help she came back to the ER.  On arrival to the ER she received IV Zofran and Compazine.  Per patient this helped her headache a bit.  Neurology consult was obtained.  Neuro recommended repeat imaging to rule out acute process. Repeat CT head, CTA head and venogram showed no evidence of dural venous sinus thrombosis or vascular malformation.  Patient given IV caffeine and 2 L NS without any improvement.  Patient additionally received Imitrex with  no change.  Neurology additionally recommended repeat LP in a.m. if opening pressure is low, proceed with blood patch for symptom improvement.  The hospitalist have been consulted for admission. To me the patient denies blurred vision, states light does not affect her eyes.  She does complain of a tender neck.  Tenderness with rotation.  Her current temperature check was 100.1.  Patient appears a bit flushed.   Review of Systems:  Per HPI  Past Medical History: Past Medical History:  Diagnosis Date   Borderline diabetes    Cholelithiasis with acute cholecystitis 08/21/2019   DM (diabetes mellitus) (HCC)    Elevated blood pressure    no meds   Gestational diabetes    Hypertension    Rheumatoid arthritis ([place])    Years ago, does not follow, no meds   S/P repeat low transverse C-section 03/27/2019   Seasonal allergies    Syncope    As a child, no problems during adulthood.   Past Surgical History:  Procedure Laterality Date   CESAREAN SECTION  04/01/2011   Procedure: CESAREAN SECTION;  Surgeon: Logan Bores;  Location: [facility];  Service: Gynecology;  Laterality: N/A;   CESAREAN SECTION N/A 05/23/2013   Procedure: CESAREAN SECTION;  Surgeon: Cheri Fowler, MD;  Location: [facility];  Service: Obstetrics;  Laterality: N/A;   CESAREAN SECTION N/A 03/27/2019   Procedure: CESAREAN SECTION;  Surgeon: Paula Compton,  MD;  Location: [facility];  Service: Obstetrics;  Laterality: N/ANira Conn,  RNFA   CHOLECYSTECTOMY N/A 08/21/2019   Procedure: LAPAROSCOPIC CHOLECYSTECTOMY with IOC;  Surgeon: Donnie Mesa, MD;  Location: [facility];  Service: General;  Laterality: N/A;   CYSTECTOMY Left    removed from underarm    [place] N/A 02/01/2018   Procedure: DILATATION AND EVACUATION;  Surgeon: Paula Compton, MD;  Location: [facility];  Service: Gynecology;  Laterality: N/A;   TOOTH EXTRACTION     wisdom teeth ext    Medications: Prior to Admission medications   Medication Sig  Start Date End Date Taking? Authorizing Provider  ENBREL SURECLICK 50 MG/ML injection Inject 50 mg into the skin once a week.   Yes [provider]  metFORMIN (GLUCOPHAGE-XR) 500 MG 24 hr tablet TAKE 1 TABLET BY MOUTH DAILY WITH BREAKFAST FOR DIABETES Patient taking differently: Take 500 mg by mouth daily with breakfast. 07/29/22  Yes Pleas Koch, NP  NIFEdipine (PROCARDIA-XL/NIFEDICAL-XL) 30 MG 24 hr tablet TAKE 1 TABLET BY MOUTH EVERY DAY FOR BLOOD PRESSURE Patient taking differently: Take 30 mg by mouth daily. 02/02/22  Yes Pleas Koch, NP  pravastatin (PRAVACHOL) 40 MG tablet TAKE 1 TABLET BY MOUTH EVERY DAY FOR CHOLESTEROL Patient not taking: Reported on 09/13/2022 09/10/22   Pleas Koch, NP    Allergies:   Allergies  Allergen Reactions   Azithromycin Nausea And Vomiting   Gatifloxacin Nausea And Vomiting   Tequin Nausea And Vomiting   Penicillins Hives, Rash and Other (See Comments)    Has patient had a PCN reaction causing immediate rash, facial/tongue/throat swelling, SOB or lightheadedness with hypotension: Unknown Has patient had a PCN reaction causing severe rash involving mucus membranes or skin necrosis: Unknown Has patient had a PCN reaction that required hospitalization: Unknown Has patient had a PCN reaction occurring within the last 10 years: No If all of the above answers are "NO", then may proceed with Cephalosporin use.     Social History:  reports that she has never smoked. She has never used smokeless tobacco. She reports that she does not drink alcohol and does not use drugs.  Family History: Family History  Adopted: Yes  Problem Relation Age of Onset   Other Other        Adopted    Physical Exam: Vitals:   09/13/22 2015 09/13/22 2030 09/13/22 2156 09/13/22 2200  BP:    (!) 155/100  Pulse: 91 94  97  Resp: 15 17  15   Temp:   98.4 F (36.9 C)   TempSrc:   Oral   SpO2: 100% 96%  96%  Weight:        General:  Alert and  oriented times three, well developed and nourished, very uncomfortable appearing female Eyes: PERRLA, pink conjunctiva, no scleral icterus ENT: Moist oral mucosa, neck supple, no thyromegaly, somewhat flushed, no tenderness to palpation over temporal arteries bilaterally Lungs: clear to ascultation, no wheeze, no crackles, no use of accessory muscles Cardiovascular: regular rate and rhythm, no regurgitation, no gallops, no murmurs. No carotid bruits, no JVD Abdomen: soft, positive BS, non-tender, non-distended, no organomegaly, not an acute abdomen GU: not examined Neuro: CN II - XII grossly intact, sensation intact Musculoskeletal: strength 5/5 all extremities, no clubbing, cyanosis or edema Skin: no rash, no subcutaneous crepitation, no decubitus Psych: appropriate patient  Labs on Admission:  Recent Labs    09/11/22 0342 09/13/22 1604 09/13/22 1613  NA 137 135  138  K 3.6 3.4* 3.4*  CL 101 104 103  CO2 24 21*  --   GLUCOSE 144* 168* 167*  BUN 11 8 8   CREATININE 0.50 0.70 0.50  CALCIUM 9.3 8.6*  --    Recent Labs    09/13/22 1604  AST 35  ALT 83*  ALKPHOS 84  BILITOT 0.5  PROT 7.4  ALBUMIN 3.1*    Recent Labs    09/11/22 0342 09/13/22 1604 09/13/22 1613  WBC 8.1 8.4  --   NEUTROABS 4.3 5.4  --   HGB 16.0* 15.7* 15.3*  HCT 48.4* 45.4 45.0  MCV 83.6 83.3  --   PLT 376 348  --      Radiological Exams on Admission: CT VENOGRAM HEAD  Result Date: 09/13/2022 CLINICAL DATA:  Headache.  Neuro deficit. EXAM: CT VENOGRAM HEAD TECHNIQUE: Venographic phase images of the brain were obtained following the administration of intravenous contrast. Multiplanar reformats and maximum intensity projections were generated. RADIATION DOSE REDUCTION: This exam was performed according to the departmental dose-optimization program which includes automated exposure control, adjustment of the mA and/or kV according to patient size and/or use of iterative reconstruction technique.  CONTRAST:  13mL OMNIPAQUE IOHEXOL 350 MG/ML SOLN COMPARISON:  CTA head and neck 09/13/2022 and 09/11/2022. FINDINGS: Delayed images demonstrate no pathologic enhancement. The dural sinuses are patent. The straight sinus and deep cerebral veins are intact. Cortical veins are within normal limits. No significant vascular malformation is evident. The right transverse sinus is dominant. Cavernous sinus is within normal limits bilaterally. Superior ophthalmic veins are normal bilaterally. IMPRESSION: 1. No evidence of dural venous sinus thrombosis. 2. No evidence of vascular malformation. 3. No pathologic enhancement. Electronically Signed   By: San Morelle M.D.   On: 09/13/2022 19:14   CT ANGIO HEAD NECK W WO CM  Result Date: 09/13/2022 CLINICAL DATA:  Persistent headaches. Nausea. Headaches are worsened with position following the lumbar puncture. EXAM: CT ANGIOGRAPHY HEAD AND NECK TECHNIQUE: Multidetector CT imaging of the head and neck was performed using the standard protocol during bolus administration of intravenous contrast. Multiplanar CT image reconstructions and MIPs were obtained to evaluate the vascular anatomy. Carotid stenosis measurements (when applicable) are obtained utilizing NASCET criteria, using the distal internal carotid diameter as the denominator. RADIATION DOSE REDUCTION: This exam was performed according to the departmental dose-optimization program which includes automated exposure control, adjustment of the mA and/or kV according to patient size and/or use of iterative reconstruction technique. CONTRAST:  38mL OMNIPAQUE IOHEXOL 350 MG/ML SOLN COMPARISON:  CTA head and neck 09/11/2022 FINDINGS: CTA NECK FINDINGS Aortic arch: A 3 vessel arch configuration is present. No significant atherosclerotic change or stenosis is present. Right carotid system: The right common carotid artery is within normal limits. Bifurcation is unremarkable. Cervical right ICA is normal. Left carotid  system: The left common carotid artery is within normal limits. Bifurcation is unremarkable. Cervical left ICA is normal. Vertebral arteries: The left vertebral artery is dominant. Both vertebral arteries originate from the subclavian arteries without significant stenosis. No significant stenosis is present in either vertebral artery in the neck. Skeleton: Vertebral body heights and alignment are normal. No focal osseous lesions are present. Other neck: Soft tissues the neck are otherwise unremarkable. Salivary glands are within normal limits. Thyroid is normal. No significant adenopathy is present. No focal mucosal or submucosal lesions are present. Upper chest: Mild dependent atelectasis is present in the lungs. Lungs are otherwise clear. Review of the MIP images confirms the  above findings CTA HEAD FINDINGS Anterior circulation: The internal carotid arteries are within normal limits through the ICA termini. Left A1 segment is dominant. Azygous A2 anatomy again noted. 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 Small branches arise from this point. No aneurysm is present. MCA bifurcations are within normal limits. ACA and MCA branch vessels are within normal limits bilaterally. Posterior circulation: The left vertebral artery is dominant vessel. PICA origins are visualized and normal. Vertebrobasilar junction basilar artery are normal. Both posterior cerebral arteries originate from basilar tip. PCA branch vessels are within normal limits bilaterally. Venous sinuses: The dural sinuses are patent. The straight sinus and deep cerebral veins are intact. Cortical veins are within normal limits. No significant vascular malformation is evident. Right transverse sinus is dominant. Review of the MIP images confirms the above findings IMPRESSION: 1. 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 No aneurysm is present. 2. Normal CTA of the neck. 3. Normal CTA circle-of-Willis  without significant proximal stenosis, aneurysm, or branch vessel occlusion. Electronically Signed   By: Marin Roberts M.D.   On: 09/13/2022 18:58   CT HEAD CODE STROKE WO CONTRAST  Result Date: 09/13/2022 CLINICAL DATA:  Code stroke. Neuro deficit, acute, stroke suspected. EXAM: CT HEAD WITHOUT CONTRAST TECHNIQUE: Contiguous axial images were obtained from the base of the skull through the vertex without intravenous contrast. RADIATION DOSE REDUCTION: This exam was performed according to the departmental dose-optimization program which includes automated exposure control, adjustment of the mA and/or kV according to patient size and/or use of iterative reconstruction technique. COMPARISON:  CT head without contrast, CTA head and neck and MR head without contrast 09/11/2022 FINDINGS: Brain: No acute infarct, hemorrhage, or mass lesion is present. No significant white matter lesions are present. Deep brain nuclei are within normal limits. The ventricles are of normal size. No significant extraaxial fluid collection is present. Vascular: No hyperdense vessel or unexpected calcification. Skull: Calvarium is intact. No focal lytic or blastic lesions are present. No significant extracranial soft tissue lesion is present. Sinuses/Orbits: The paranasal sinuses and mastoid air cells are clear. The globes and orbits are within normal limits. IMPRESSION: Negative CT of the head. Electronically Signed   By: Marin Roberts M.D.   On: 09/13/2022 16:18    Assessment/Plan Present on Admission:  Intractable headache -Admit to MedSurg -N.p.o. at midnight -Will try IV Dilaudid x 1.  -Consult IR LP in a.m./?blood patch -INR in a.m. -Repeat temperature check was 98.6 -Concern for post lumbar puncture headache   Hyperglycemia -Sliding scale insulin.  Hemoglobin A1c ordered   Essential hypertension -Stable, nifedipine resumed   Hyperlipemia -Stable, pravastatin resumed   Rheumatoid arthritis  (HCC)/immunocompromise state -Continue weekly Enbrel  Javarian Jakubiak 09/13/2022, 10:34 PM

## 2022-09-13 NOTE — ED Triage Notes (Signed)
Pt BIB [facility] from home as a code stroke which was later cancelled. Pt c/o severe headache and vomiting. 4mg  zofran given by EMS. 50 cc fluids also given. Pt was seen 2 days ago for same.

## 2022-09-13 NOTE — ED Provider Notes (Signed)
[facility] EMERGENCY DEPARTMENT Provider Note   CSN: [id number] Arrival date & time: 09/13/22  1601     History  Chief Complaint  Patient presents with   Headache    Kelly Gregory is a 36 y.o. female.  Pt presented two days ago with acute/severe headaches - had extensive workup then including ct/cta/mri and LP - plan there for outpatient ns f/u.  Since ED d/c pt notes persistent/constant head pain, generalized, with intermittent nausea/vomiting. No fever/chills. No sinus drainage or pain. No eye pain or change in vision. No neck pain/stiffness. No new numbness/weakness. No prior hx severe or chronic headaches.  Headache is worse when upright, although pt notes even prior to LP her headache was worse when upright or standing (I.e. pt does not note positional headache change since prior LP).  The history is provided by the patient, medical records, the spouse and the EMS personnel.  Headache Associated symptoms: nausea and vomiting   Associated symptoms: no abdominal pain, no back pain, no cough, no eye pain, no fever, no neck pain, no neck stiffness, no numbness, no sinus pressure and no weakness        Home Medications Prior to Admission medications   Medication Sig Start Date End Date Taking? Authorizing Provider  etanercept (ENBREL) 50 MG/ML injection Inject 50 mg into the skin every 14 (fourteen) days.    [provider]  medroxyPROGESTERone (PROVERA) 5 MG tablet Take by mouth. 03/16/22   [provider]  metFORMIN (GLUCOPHAGE-XR) 500 MG 24 hr tablet TAKE 1 TABLET BY MOUTH DAILY WITH BREAKFAST FOR DIABETES 07/29/22   Doreene Nest, NP  NIFEdipine (PROCARDIA-XL/NIFEDICAL-XL) 30 MG 24 hr tablet TAKE 1 TABLET BY MOUTH EVERY DAY FOR BLOOD PRESSURE 02/02/22   Doreene Nest, NP  pravastatin (PRAVACHOL) 40 MG tablet TAKE 1 TABLET BY MOUTH EVERY DAY FOR CHOLESTEROL 09/10/22   Doreene Nest, NP      Allergies    Azithromycin,  Gatifloxacin, Tequin, and Penicillins    Review of Systems   Review of Systems  Constitutional:  Negative for chills and fever.  HENT:  Negative for sinus pressure and sinus pain.   Eyes:  Negative for pain, redness and visual disturbance.  Respiratory:  Negative for cough and shortness of breath.   Cardiovascular:  Negative for chest pain.  Gastrointestinal:  Positive for nausea and vomiting. Negative for abdominal pain.  Genitourinary:  Negative for dysuria and flank pain.  Musculoskeletal:  Negative for back pain, neck pain and neck stiffness.  Skin:  Negative for rash.  Neurological:  Positive for headaches. Negative for speech difficulty, weakness and numbness.  Hematological:  Does not bruise/bleed easily.  Psychiatric/Behavioral:  Negative for confusion.     Physical Exam Updated Vital Signs BP 126/82   Pulse 84   Temp 98.6 F (37 C) (Oral)   Resp 11   Wt 74.6 kg   LMP 09/10/2022   SpO2 94%   BMI 35.59 kg/m  Physical Exam Vitals and nursing note reviewed.  Constitutional:      Appearance: Normal appearance. She is well-developed.  HENT:     Head: Atraumatic.     Comments: No sinus or temporal tenderness.     Nose: Nose normal.     Mouth/Throat:     Mouth: Mucous membranes are moist.  Eyes:     General: No scleral icterus.    Extraocular Movements: Extraocular movements intact.     Conjunctiva/sclera: Conjunctivae normal.  Pupils: Pupils are equal, round, and reactive to light.  Neck:     Trachea: No tracheal deviation.     Comments: No stiffness or rigidity.  Cardiovascular:     Rate and Rhythm: Normal rate and regular rhythm.     Pulses: Normal pulses.     Heart sounds: Normal heart sounds. No murmur heard.    No friction rub. No gallop.  Pulmonary:     Effort: Pulmonary effort is normal. No respiratory distress.     Breath sounds: Normal breath sounds.  Abdominal:     General: There is no distension.     Palpations: Abdomen is soft.      Tenderness: There is no abdominal tenderness.  Genitourinary:    Comments: No cva tenderness.  Musculoskeletal:        General: No swelling.     Cervical back: Normal range of motion and neck supple. No rigidity. No muscular tenderness.     Right lower leg: No edema.     Left lower leg: No edema.  Skin:    General: Skin is warm and dry.     Findings: No rash.  Neurological:     General: No focal deficit present.     Mental Status: She is alert and oriented to person, place, and time.     Comments: Alert, speech normal. Motor/sens grossly intact bil.   Psychiatric:        Mood and Affect: Mood normal.     ED Results / Procedures / Treatments   Labs (all labs ordered are listed, but only abnormal results are displayed) Results for orders placed or performed during the hospital encounter of 09/13/22  Protime-INR  Result Value Ref Range   Prothrombin Time 13.4 11.4 - 15.2 seconds   INR 1.0 0.8 - 1.2  APTT  Result Value Ref Range   aPTT 25 24 - 36 seconds  CBC  Result Value Ref Range   WBC 8.4 4.0 - 10.5 K/uL   RBC 5.45 (H) 3.87 - 5.11 MIL/uL   Hemoglobin 15.7 (H) 12.0 - 15.0 g/dL   HCT 16.9 67.8 - 93.8 %   MCV 83.3 80.0 - 100.0 fL   MCH 28.8 26.0 - 34.0 pg   MCHC 34.6 30.0 - 36.0 g/dL   RDW 10.1 75.1 - 02.5 %   Platelets 348 150 - 400 K/uL   nRBC 0.0 0.0 - 0.2 %  Differential  Result Value Ref Range   Neutrophils Relative % 65 %   Neutro Abs 5.4 1.7 - 7.7 K/uL   Lymphocytes Relative 30 %   Lymphs Abs 2.6 0.7 - 4.0 K/uL   Monocytes Relative 5 %   Monocytes Absolute 0.4 0.1 - 1.0 K/uL   Eosinophils Relative 0 %   Eosinophils Absolute 0.0 0.0 - 0.5 K/uL   Basophils Relative 0 %   Basophils Absolute 0.0 0.0 - 0.1 K/uL   Immature Granulocytes 0 %   Abs Immature Granulocytes 0.02 0.00 - 0.07 K/uL  Comprehensive metabolic panel  Result Value Ref Range   Sodium 135 135 - 145 mmol/L   Potassium 3.4 (L) 3.5 - 5.1 mmol/L   Chloride 104 98 - 111 mmol/L   CO2 21 (L) 22 -  32 mmol/L   Glucose, Bld 168 (H) 70 - 99 mg/dL   BUN 8 6 - 20 mg/dL   Creatinine, Ser 8.52 0.44 - 1.00 mg/dL   Calcium 8.6 (L) 8.9 - 10.3 mg/dL   Total Protein 7.4 6.5 -  8.1 g/dL   Albumin 3.1 (L) 3.5 - 5.0 g/dL   AST 35 15 - 41 U/L   ALT 83 (H) 0 - 44 U/L   Alkaline Phosphatase 84 38 - 126 U/L   Total Bilirubin 0.5 0.3 - 1.2 mg/dL   GFR, Estimated >09 >81 mL/min   Anion gap 10 5 - 15  I-stat chem 8, ED  Result Value Ref Range   Sodium 138 135 - 145 mmol/L   Potassium 3.4 (L) 3.5 - 5.1 mmol/L   Chloride 103 98 - 111 mmol/L   BUN 8 6 - 20 mg/dL   Creatinine, Ser 1.91 0.44 - 1.00 mg/dL   Glucose, Bld 478 (H) 70 - 99 mg/dL   Calcium, Ion 2.95 (L) 1.15 - 1.40 mmol/L   TCO2 23 22 - 32 mmol/L   Hemoglobin 15.3 (H) 12.0 - 15.0 g/dL   HCT 62.1 30.8 - 65.7 %  CBG monitoring, ED  Result Value Ref Range   Glucose-Capillary 172 (H) 70 - 99 mg/dL  CBG monitoring, ED  Result Value Ref Range   Glucose-Capillary 150 (H) 70 - 99 mg/dL   CT HEAD CODE STROKE WO CONTRAST  Result Date: 09/13/2022 CLINICAL DATA:  Code stroke. Neuro deficit, acute, stroke suspected. EXAM: CT HEAD WITHOUT CONTRAST TECHNIQUE: Contiguous axial images were obtained from the base of the skull through the vertex without intravenous contrast. RADIATION DOSE REDUCTION: This exam was performed according to the departmental dose-optimization program which includes automated exposure control, adjustment of the mA and/or kV according to patient size and/or use of iterative reconstruction technique. COMPARISON:  CT head without contrast, CTA head and neck and MR head without contrast 09/11/2022 FINDINGS: Brain: No acute infarct, hemorrhage, or mass lesion is present. No significant white matter lesions are present. Deep brain nuclei are within normal limits. The ventricles are of normal size. No significant extraaxial fluid collection is present. Vascular: No hyperdense vessel or unexpected calcification. Skull: Calvarium is intact.  No focal lytic or blastic lesions are present. No significant extracranial soft tissue lesion is present. Sinuses/Orbits: The paranasal sinuses and mastoid air cells are clear. The globes and orbits are within normal limits. IMPRESSION: Negative CT of the head. Electronically Signed   By: Marin Roberts M.D.   On: 09/13/2022 16:18   MR BRAIN WO CONTRAST  Result Date: 09/11/2022 CLINICAL DATA:  Headache EXAM: MRI HEAD WITHOUT CONTRAST TECHNIQUE: Multiplanar, multiecho pulse sequences of the brain and surrounding structures were obtained without intravenous contrast. COMPARISON:  Head CT 09/10/2022 FINDINGS: Brain: No acute infarction, hemorrhage, hydrocephalus, extra-axial collection or mass lesion. No chronic microhemorrhage. Normal white matter signal. Normal CSF spaces. Midline structures are normal. Vascular: Normal flow voids. Skull and upper cervical spine: Normal marrow signal. Sinuses/Orbits: Paranasal sinuses are clear. No mastoid effusion. Normal orbits. Other: None. IMPRESSION: Normal MRI of the brain. Electronically Signed   By: Deatra Robinson M.D.   On: 09/11/2022 19:26   DG FL GUIDED LUMBAR PUNCTURE  Result Date: 09/11/2022 CLINICAL DATA:  Patient presents to ED with complaints of severe headache. Request received for fluoroscopic guided lumbar puncture to rule out SAH. EXAM: DIAGNOSTIC LUMBAR PUNCTURE UNDER FLUOROSCOPIC GUIDANCE COMPARISON:  None Available. FLUOROSCOPY: Radiation Exposure Index (as provided by the fluoroscopic device): 19.50 mGy Kerma PROCEDURE: Informed consent was obtained from the patient prior to the procedure, including potential complications of headache, allergy, and pain. With the patient prone, the lower back was prepped with Betadine. 1% Lidocaine was used for local anesthesia. Lumbar  puncture was initially attempted at L3-L4 with return of dark bloody fluid. The needle was removed. Puncture was then performed at the L4-L5 level using a 20 gauge needle with  return of clear, blood-tinged CSF. Patient was rolled into the left lateral decubitus position. Opening pressure was 18 cm water. 6 ml of CSF were obtained for laboratory studies. The patient tolerated the procedure well and there were no apparent complications. Procedure was performed by Alex Gardener, NP and supervised by Sebastian Ache, MD. IMPRESSION: Technically successful fluoroscopically guided diagnostic lumbar puncture. Read by: Alex Gardener, AGNP-BC Electronically Signed   By: Sebastian Ache M.D.   On: 09/11/2022 14:04   CT ANGIO HEAD NECK W WO CM  Result Date: 09/11/2022 CLINICAL DATA:  36 year old female with worst headache of life, sudden onset. Blurred vision. EXAM: CT ANGIOGRAPHY HEAD AND NECK TECHNIQUE: Multidetector CT imaging of the head and neck was performed using the standard protocol during bolus administration of intravenous contrast. Multiplanar CT image reconstructions and MIPs were obtained to evaluate the vascular anatomy. Carotid stenosis measurements (when applicable) are obtained utilizing NASCET criteria, using the distal internal carotid diameter as the denominator. RADIATION DOSE REDUCTION: This exam was performed according to the departmental dose-optimization program which includes automated exposure control, adjustment of the mA and/or kV according to patient size and/or use of iterative reconstruction technique. CONTRAST:  47mL OMNIPAQUE IOHEXOL 350 MG/ML SOLN COMPARISON:  Plain head CT 0347 hours today. FINDINGS: CTA NECK Skeleton: Negative. Upper chest: Patchy bilateral dependent pulmonary opacity and/or mosaic attenuation. Negative visible superior mediastinum, trachea. Other neck: Negative; no acute neck soft tissue finding. Aortic arch: 3 vessel arch configuration with no arch atherosclerosis. Right carotid system: Mildly tortuous brachiocephalic artery and proximal right CCA with no plaque or stenosis. Mild streak artifact related to right IJ venous contrast reflux. Negative  right carotid bifurcation and cervical right ICA. Left carotid system: Mild tortuosity but otherwise negative. Vertebral arteries: Right subclavian venous contrast streak artifact but the proximal right subclavian artery and vertebral artery origin appear normal. Right vertebral artery appears patent and within normal limits to the skull base. Proximal left subclavian artery and left vertebral artery origin are normal. Dominant left vertebral artery is patent and normal to the skull base. CTA HEAD Posterior circulation: Right vertebral V4 segment is diminutive beyond the normal PICA origin. Left V4 is dominant. Left PICA origin is patent. No distal vertebral or vertebrobasilar junction stenosis. Patent basilar artery, AICA origins, SCA and PCA origins. Posterior communicating arteries are diminutive or absent. Bilateral PCA branches are within normal limits. Anterior circulation: Both ICA siphons are patent. No siphon plaque or stenosis. Ophthalmic artery origins appear normal. Patent carotid termini, MCA and ACA origins. Dominant left and diminutive right ACA A1 segments. Azygous ACA A2 anatomy. ACA branches are within normal limits. Left MCA M1 segment is within normal limits, diverticulum of the lenticulostriate vessels is suspected on series 11, image 15 (normal variant) this is about 2 mm. Left MCA bifurcation and branches are within normal limits. Right MCA M1 segment bifurcates early without stenosis. Right MCA branches are within normal limits. Venous sinuses: Patent. Right transverse and sigmoid venous sinuses are dominant. Anatomic variants: Mildly dominant left vertebral artery, especially the V4 segment. Dominant left ACA A1 and azygous ACA A2 anatomy. Review of the MIP images confirms the above findings IMPRESSION: 1. Essentially normal CTA Head and Neck; - no atherosclerosis or stenosis. - several vascular anatomic variations. Including a suspected Left MCA lenticulostriate artery infundibulum whereas  alternatively a 2 mm Left M1 aneurysm is felt unlikely. But surveillance with Head MRA (noncontrast, no ionizing radiation) can ensue to document stability. 2. Patchy nonspecific upper lobe pulmonary opacity. Favor atelectasis over infection or edema. Electronically Signed   By: Odessa Fleming M.D.   On: 09/11/2022 05:54   CT Head Wo Contrast  Result Date: 09/11/2022 CLINICAL DATA:  Headache EXAM: CT HEAD WITHOUT CONTRAST TECHNIQUE: Contiguous axial images were obtained from the base of the skull through the vertex without intravenous contrast. RADIATION DOSE REDUCTION: This exam was performed according to the departmental dose-optimization program which includes automated exposure control, adjustment of the mA and/or kV according to patient size and/or use of iterative reconstruction technique. COMPARISON:  None Available. FINDINGS: Brain: There is no mass, hemorrhage or extra-axial collection. The size and configuration of the ventricles and extra-axial CSF spaces are normal. The brain parenchyma is normal, without acute or chronic infarction. Vascular: No abnormal hyperdensity of the major intracranial arteries or dural venous sinuses. No intracranial atherosclerosis. Skull: The visualized skull base, calvarium and extracranial soft tissues are normal. Sinuses/Orbits: No fluid levels or advanced mucosal thickening of the visualized paranasal sinuses. No mastoid or middle ear effusion. The orbits are normal. IMPRESSION: Normal head CT. Electronically Signed   By: Deatra Robinson M.D.   On: 09/11/2022 03:57    EKG EKG Interpretation  Date/Time:  Sunday September 13 2022 16:41:27 EST Ventricular Rate:  76 PR Interval:  148 QRS Duration: 91 QT Interval:  369 QTC Calculation: 415 R Axis:   60 Text Interpretation: Sinus rhythm Nonspecific T wave abnormality Confirmed by Cathren Laine ([id number]) on 09/13/2022 5:00:58 PM  Radiology CT VENOGRAM HEAD  Result Date: 09/13/2022 CLINICAL DATA:  Headache.  Neuro  deficit. EXAM: CT VENOGRAM HEAD TECHNIQUE: Venographic phase images of the brain were obtained following the administration of intravenous contrast. Multiplanar reformats and maximum intensity projections were generated. RADIATION DOSE REDUCTION: This exam was performed according to the departmental dose-optimization program which includes automated exposure control, adjustment of the mA and/or kV according to patient size and/or use of iterative reconstruction technique. CONTRAST:  39mL OMNIPAQUE IOHEXOL 350 MG/ML SOLN COMPARISON:  CTA head and neck 09/13/2022 and 09/11/2022. FINDINGS: Delayed images demonstrate no pathologic enhancement. The dural sinuses are patent. The straight sinus and deep cerebral veins are intact. Cortical veins are within normal limits. No significant vascular malformation is evident. The right transverse sinus is dominant. Cavernous sinus is within normal limits bilaterally. Superior ophthalmic veins are normal bilaterally. IMPRESSION: 1. No evidence of dural venous sinus thrombosis. 2. No evidence of vascular malformation. 3. No pathologic enhancement. Electronically Signed   By: Marin Roberts M.D.   On: 09/13/2022 19:14   CT ANGIO HEAD NECK W WO CM  Result Date: 09/13/2022 CLINICAL DATA:  Persistent headaches. Nausea. Headaches are worsened with position following the lumbar puncture. EXAM: CT ANGIOGRAPHY HEAD AND NECK TECHNIQUE: Multidetector CT imaging of the head and neck was performed using the standard protocol during bolus administration of intravenous contrast. Multiplanar CT image reconstructions and MIPs were obtained to evaluate the vascular anatomy. Carotid stenosis measurements (when applicable) are obtained utilizing NASCET criteria, using the distal internal carotid diameter as the denominator. RADIATION DOSE REDUCTION: This exam was performed according to the departmental dose-optimization program which includes automated exposure control, adjustment of the mA  and/or kV according to patient size and/or use of iterative reconstruction technique. CONTRAST:  65mL OMNIPAQUE IOHEXOL 350 MG/ML SOLN COMPARISON:  CTA head and neck 09/11/2022  FINDINGS: CTA NECK FINDINGS Aortic arch: A 3 vessel arch configuration is present. No significant atherosclerotic change or stenosis is present. Right carotid system: The right common carotid artery is within normal limits. Bifurcation is unremarkable. Cervical right ICA is normal. Left carotid system: The left common carotid artery is within normal limits. Bifurcation is unremarkable. Cervical left ICA is normal. Vertebral arteries: The left vertebral artery is dominant. Both vertebral arteries originate from the subclavian arteries without significant stenosis. No significant stenosis is present in either vertebral artery in the neck. Skeleton: Vertebral body heights and alignment are normal. No focal osseous lesions are present. Other neck: Soft tissues the neck are otherwise unremarkable. Salivary glands are within normal limits. Thyroid is normal. No significant adenopathy is present. No focal mucosal or submucosal lesions are present. Upper chest: Mild dependent atelectasis is present in the lungs. Lungs are otherwise clear. Review of the MIP images confirms the above findings CTA HEAD FINDINGS Anterior circulation: The internal carotid arteries are within normal limits through the ICA termini. Left A1 segment is dominant. Azygous A2 anatomy again noted. 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 Small branches arise from this point. No aneurysm is present. MCA bifurcations are within normal limits. ACA and MCA branch vessels are within normal limits bilaterally. Posterior circulation: The left vertebral artery is dominant vessel. PICA origins are visualized and normal. Vertebrobasilar junction basilar artery are normal. Both posterior cerebral arteries originate from basilar tip. PCA branch  vessels are within normal limits bilaterally. Venous sinuses: The dural sinuses are patent. The straight sinus and deep cerebral veins are intact. Cortical veins are within normal limits. No significant vascular malformation is evident. Right transverse sinus is dominant. Review of the MIP images confirms the above findings IMPRESSION: 1. 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 No aneurysm is present. 2. Normal CTA of the neck. 3. Normal CTA circle-of-Willis without significant proximal stenosis, aneurysm, or branch vessel occlusion. Electronically Signed   By: Marin Roberts M.D.   On: 09/13/2022 18:58   CT HEAD CODE STROKE WO CONTRAST  Result Date: 09/13/2022 CLINICAL DATA:  Code stroke. Neuro deficit, acute, stroke suspected. EXAM: CT HEAD WITHOUT CONTRAST TECHNIQUE: Contiguous axial images were obtained from the base of the skull through the vertex without intravenous contrast. RADIATION DOSE REDUCTION: This exam was performed according to the departmental dose-optimization program which includes automated exposure control, adjustment of the mA and/or kV according to patient size and/or use of iterative reconstruction technique. COMPARISON:  CT head without contrast, CTA head and neck and MR head without contrast 09/11/2022 FINDINGS: Brain: No acute infarct, hemorrhage, or mass lesion is present. No significant white matter lesions are present. Deep brain nuclei are within normal limits. The ventricles are of normal size. No significant extraaxial fluid collection is present. Vascular: No hyperdense vessel or unexpected calcification. Skull: Calvarium is intact. No focal lytic or blastic lesions are present. No significant extracranial soft tissue lesion is present. Sinuses/Orbits: The paranasal sinuses and mastoid air cells are clear. The globes and orbits are within normal limits. IMPRESSION: Negative CT of the head. Electronically Signed   By: Marin Roberts M.D.   On:  09/13/2022 16:18    Procedures Procedures    Medications Ordered in ED Medications  prochlorperazine (COMPAZINE) injection 10 mg (10 mg Intravenous Given 09/13/22 1634)  sodium chloride flush (NS) 0.9 % injection 3 mL (3 mLs Intravenous Given 09/13/22 1633)    ED Course/  Medical Decision Making/ A&P                           Medical Decision Making Problems Addressed: Hypokalemia: acute illness or injury Intractable episodic headache, unspecified headache type: acute illness or injury with systemic symptoms Nausea and vomiting in adult: acute illness or injury with systemic symptoms that poses a threat to life or bodily functions Post lumbar puncture headache: acute illness or injury with systemic symptoms  Amount and/or Complexity of Data Reviewed Independent Historian: spouse    Details: hx External Data Reviewed: radiology and notes. Labs: ordered. Decision-making details documented in ED Course. Radiology: ordered and independent interpretation performed. Decision-making details documented in ED Course. ECG/medicine tests: ordered and independent interpretation performed. Decision-making details documented in ED Course. Discussion of management or test interpretation with external provider(s): Neurology/medicine  Risk Prescription drug management. Decision regarding hospitalization.  Iv ns. Continuous pulse ox and cardiac monitoring. Labs ordered/sent. Imaging ordered.   Diff dx includes sah, migraine h/a, tension h/a, etc - dispo decision including potential need for admission considered - will get labs and imaging and reassess.   Reviewed nursing notes and prior charts for additional history. External reports reviewed. Additional history from:  Cardiac monitor: sinus rhythm, rate 80.  Labs reviewed/interpreted by me - k sl low. Wbc normal.   CT reviewed/interpreted by me - no hem.   Neurology consulted - discussed pt.  Repeat imaging today neg for acute process -  discussed plan of admission to medicine for ivf/pain control, and possible radiology/fluoro repeat LP in AM and if pressure low, proceed with blood patch procedure for symptom improvement.   Will consult medicine/hospitalist for admission.            Final Clinical Impression(s) / ED Diagnoses Final diagnoses:  None    Rx / DC Orders ED Discharge Orders     None         Cathren Laine, MD 09/13/22 1944

## 2022-09-13 NOTE — Consult Note (Addendum)
Neurology Consultation  Reason for Consult: Code Stroke Referring Physician: Denton Lank  CC: Headache  History is obtained from:Patient, chart review  HPI: Kelly Gregory is a 36 y.o. female with a past medical history of DM and HTN presenting with headache and nausea.  She was initially seen 12/29 for headache.  CT, CTA, and MRI were unremarkable.  There was a question of a 2 mm left MCA infundibulum versus aneurysm but it looks more like a anatomical variant but no evidence of rupture. An LP was done under fluoroscopy guidance as well that was bloody with RBCs clearing on subsequent tubes-not suggestive of subarachnoid hemorrhage.   She reports the headaches have been constant since then.  They have somewhat worsened after the LP, only worsened by position but otherwise have remained of the same quality. On presentation she had multiple episodes of emesis.  Received 4 mg of Zofran by EMS and then 10 mg of Compazine at the bridge.  She was brought to CT and CT head showed no bleed and no change from previous imaging.  She is currently taking metformin for diabetes and medication for her blood pressure.  She is compliant with her medications, however she has been vomiting.  She states that she does not typically have difficulty controlling her blood pressure.  She has not had any change in her symptoms since Friday.  She does say that she will find some relief when she is lying flat however when she is up and walking around her headache is severe.  Today she started vomiting while she was taking a shower and called EMS.  Blood pressure per EMS systolic 190s, CBG 145.  LKW: 1430 hours tpa given?: no, if anything symptoms too mild to treat but mostly because stroke not suspected Premorbid modified Rankin scale (mRS):  0-Completely asymptomatic and back to baseline post-stroke  ROS: Full ROS was performed and is negative except as noted in the HPI.   Past Medical History:  Diagnosis Date   Borderline  diabetes    Cholelithiasis with acute cholecystitis 08/21/2019   DM (diabetes mellitus) (HCC)    Elevated blood pressure    no meds   Gestational diabetes    Hypertension    Rheumatoid arthritis (HCC)    Years ago, does not follow, no meds   S/P repeat low transverse C-section 03/27/2019   Seasonal allergies    Syncope    As a child, no problems during adulthood.   Family History  Adopted: Yes  Problem Relation Age of Onset   Other Other        Adopted    Social History:   reports that she has never smoked. She has never used smokeless tobacco. She reports that she does not drink alcohol and does not use drugs.  Medications No current facility-administered medications for this encounter.  Current Outpatient Medications:    etanercept (ENBREL) 50 MG/ML injection, Inject 50 mg into the skin every 14 (fourteen) days., Disp: , Rfl:    medroxyPROGESTERone (PROVERA) 5 MG tablet, Take by mouth., Disp: , Rfl:    metFORMIN (GLUCOPHAGE-XR) 500 MG 24 hr tablet, TAKE 1 TABLET BY MOUTH DAILY WITH BREAKFAST FOR DIABETES, Disp: 90 tablet, Rfl: 0   NIFEdipine (PROCARDIA-XL/NIFEDICAL-XL) 30 MG 24 hr tablet, TAKE 1 TABLET BY MOUTH EVERY DAY FOR BLOOD PRESSURE, Disp: 90 tablet, Rfl: 2   pravastatin (PRAVACHOL) 40 MG tablet, TAKE 1 TABLET BY MOUTH EVERY DAY FOR CHOLESTEROL, Disp: 90 tablet, Rfl: 0  Exam: Current vital  signs: BP 112/74   Pulse 84   Temp 98.6 F (37 C) (Oral)   Resp 13   Wt 74.6 kg   LMP 09/10/2022   SpO2 97%   BMI 35.59 kg/m  Vital signs in last 24 hours: Temp:  [98.6 F (37 C)] 98.6 F (37 C) (12/31 1633) Pulse Rate:  [84-88] 84 (12/31 1700) Resp:  [10-13] 13 (12/31 1700) BP: (112-126)/(74-82) 112/74 (12/31 1700) SpO2:  [94 %-97 %] 97 % (12/31 1700) Weight:  [74.6 kg] 74.6 kg (12/31 1600) General: Awake alert in distress due to vomiting and headache HEENT: Normocephalic atraumatic Lungs: Clear Cardiovascular: Tachycardic Neurological exam Awake alert oriented  not in distress due to vomiting and nausea No dysarthria No aphasia Cranial nerves II to XII intact No motor deficits No sensory deficits No coordination deficits NIH stroke scale-0  Labs I have reviewed labs in epic and the results pertinent to this consultation are: CBC    Component Value Date/Time   WBC 8.4 09/13/2022 1604   RBC 5.45 (H) 09/13/2022 1604   HGB 15.3 (H) 09/13/2022 1613   HCT 45.0 09/13/2022 1613   PLT 348 09/13/2022 1604   MCV 83.3 09/13/2022 1604   MCH 28.8 09/13/2022 1604   MCHC 34.6 09/13/2022 1604   RDW 13.2 09/13/2022 1604   LYMPHSABS 2.6 09/13/2022 1604   MONOABS 0.4 09/13/2022 1604   EOSABS 0.0 09/13/2022 1604   BASOSABS 0.0 09/13/2022 1604    CMP     Component Value Date/Time   NA 138 09/13/2022 1613   K 3.4 (L) 09/13/2022 1613   CL 103 09/13/2022 1613   CO2 21 (L) 09/13/2022 1604   GLUCOSE 167 (H) 09/13/2022 1613   BUN 8 09/13/2022 1613   CREATININE 0.50 09/13/2022 1613   CREATININE 0.69 07/14/2019 1435   CALCIUM 8.6 (L) 09/13/2022 1604   PROT 7.4 09/13/2022 1604   ALBUMIN 3.1 (L) 09/13/2022 1604   AST 35 09/13/2022 1604   ALT 83 (H) 09/13/2022 1604   ALKPHOS 84 09/13/2022 1604   BILITOT 0.5 09/13/2022 1604   GFRNONAA >60 09/13/2022 1604   GFRAA >60 08/21/2019 0226    Lipid Panel     Component Value Date/Time   CHOL 253 (H) 06/04/2022 1200   TRIG 241.0 (H) 06/04/2022 1200   HDL 41.80 06/04/2022 1200   CHOLHDL 6 06/04/2022 1200   VLDL 48.2 (H) 06/04/2022 1200   LDLCALC 154 (H) 11/08/2020 0826   LDLDIRECT 184.0 06/04/2022 1200   Kelly Picker, DNP, FNP-BC [facility] Pager: [phone]  Imaging I have reviewed the images obtained:  CT-head  MRI examination of the brain  Assessment:  Kelly Gregory was seen recently for a headache work up and had an LP done on 12/29.  She reports her headache is the same as it was before the LP but the description of her headache currently is concerning for a low pressure  headache as she states that her headache improves when she is lying flat. On arrival she had multiple episodes of emesis, received 10mg  of compazine at the bridge.  Head CT was unchanged from previous imaging on 12/29.  Impression: Concern for low pressure headache   Recommendations: I would do a CT angiogram and CT venogram head and neck at this time again to make sure that there is no aneurysm that was missed and there is no evidence of dural venous sinus thrombus. If both of them are negative, I would give her IV caffeine (excessive nausea and vomiting  precludes p.o.) I have already ordered 2 L of normal saline bolus If the imaging remains unremarkable, symptomatic management with fluids and caffeine should be tried and if no relief, then anesthesia should be consulted for blood patch. This plan was discussed with Dr. Denton Lank in the emergency room as well as the patient and her husband the patient's bedside.   Attending Neurohospitalist Addendum Patient seen and examined with APP/Resident. Agree with the history and physical as documented above. Agree with the plan as documented, which I helped formulate. I have independently reviewed the chart, obtained history, review of systems and examined the patient.I have personally reviewed pertinent head/neck/spine imaging (CT/MRI). Please feel free to call with any questions.  -- Milon Dikes, MD Neurologist [facility] Pager: [phone]

## 2022-09-14 ENCOUNTER — Inpatient Hospital Stay (HOSPITAL_COMMUNITY): Payer: 59

## 2022-09-14 ENCOUNTER — Other Ambulatory Visit: Payer: Self-pay

## 2022-09-14 DIAGNOSIS — R51 Headache with orthostatic component, not elsewhere classified: Secondary | ICD-10-CM

## 2022-09-14 DIAGNOSIS — R519 Headache, unspecified: Secondary | ICD-10-CM | POA: Diagnosis not present

## 2022-09-14 DIAGNOSIS — M5481 Occipital neuralgia: Secondary | ICD-10-CM | POA: Diagnosis not present

## 2022-09-14 LAB — CBC WITH DIFFERENTIAL/PLATELET
Abs Immature Granulocytes: 0.03 10*3/uL (ref 0.00–0.07)
Basophils Absolute: 0 10*3/uL (ref 0.0–0.1)
Basophils Relative: 0 %
Eosinophils Absolute: 0 10*3/uL (ref 0.0–0.5)
Eosinophils Relative: 0 %
HCT: 41.8 % (ref 36.0–46.0)
Hemoglobin: 14.3 g/dL (ref 12.0–15.0)
Immature Granulocytes: 0 %
Lymphocytes Relative: 19 %
Lymphs Abs: 1.8 10*3/uL (ref 0.7–4.0)
MCH: 28.3 pg (ref 26.0–34.0)
MCHC: 34.2 g/dL (ref 30.0–36.0)
MCV: 82.8 fL (ref 80.0–100.0)
Monocytes Absolute: 0.3 10*3/uL (ref 0.1–1.0)
Monocytes Relative: 3 %
Neutro Abs: 7.1 10*3/uL (ref 1.7–7.7)
Neutrophils Relative %: 78 %
Platelets: 319 10*3/uL (ref 150–400)
RBC: 5.05 MIL/uL (ref 3.87–5.11)
RDW: 13.3 % (ref 11.5–15.5)
WBC: 9.1 10*3/uL (ref 4.0–10.5)
nRBC: 0 % (ref 0.0–0.2)

## 2022-09-14 LAB — MAGNESIUM: Magnesium: 1.8 mg/dL (ref 1.7–2.4)

## 2022-09-14 LAB — BASIC METABOLIC PANEL
Anion gap: 12 (ref 5–15)
BUN: 8 mg/dL (ref 6–20)
CO2: 19 mmol/L — ABNORMAL LOW (ref 22–32)
Calcium: 8.2 mg/dL — ABNORMAL LOW (ref 8.9–10.3)
Chloride: 102 mmol/L (ref 98–111)
Creatinine, Ser: 0.66 mg/dL (ref 0.44–1.00)
GFR, Estimated: >60 mL/min (ref >60)
Glucose, Bld: 127 mg/dL — ABNORMAL HIGH (ref 70–99)
Potassium: 3.6 mmol/L (ref 3.5–5.1)
Sodium: 133 mmol/L — ABNORMAL LOW (ref 135–145)

## 2022-09-14 LAB — GLUCOSE, CAPILLARY
Glucose-Capillary: 114 mg/dL — ABNORMAL HIGH (ref 70–99)
Glucose-Capillary: 109 mg/dL — ABNORMAL HIGH (ref 70–99)

## 2022-09-14 LAB — HIV ANTIBODY (ROUTINE TESTING W REFLEX): HIV Screen 4th Generation wRfx: NONREACTIVE

## 2022-09-14 LAB — PROTIME-INR
INR: 1.1 (ref 0.8–1.2)
Prothrombin Time: 13.7 seconds (ref 11.4–15.2)

## 2022-09-14 LAB — CBG MONITORING, ED: Glucose-Capillary: 124 mg/dL — ABNORMAL HIGH (ref 70–99)

## 2022-09-14 MED ORDER — INSULIN ASPART 100 UNIT/ML IJ SOLN
0.0000 [IU] | Freq: Four times a day (QID) | INTRAMUSCULAR | Status: DC
  Administered 2022-09-14: 2 [IU] via SUBCUTANEOUS
  Administered 2022-09-15: 3 [IU] via SUBCUTANEOUS
  Administered 2022-09-16 (×2): 2 [IU] via SUBCUTANEOUS

## 2022-09-14 MED ORDER — SODIUM CHLORIDE 0.9 % IV BOLUS: 1000.0000 mL | Freq: Two times a day (BID) | INTRAVENOUS | Status: DC

## 2022-09-14 MED ORDER — SODIUM CHLORIDE 0.9 % IV BOLUS
1000.0000 mL | Freq: Two times a day (BID) | INTRAVENOUS | Status: AC
  Administered 2022-09-14 – 2022-09-15 (×3): 1000 mL via INTRAVENOUS

## 2022-09-14 MED ORDER — SODIUM CHLORIDE 0.9 % IV SOLN
500.0000 mg | Freq: Three times a day (TID) | INTRAVENOUS | Status: DC
  Administered 2022-09-14 – 2022-09-15 (×2): 500 mg via INTRAVENOUS
  Filled 2022-09-14 (×4): qty 2

## 2022-09-14 MED ORDER — PNEUMOCOCCAL VAC POLYVALENT 25 MCG/0.5ML IJ INJ
0.5000 mL | INJECTION | INTRAMUSCULAR | Status: DC
  Filled 2022-09-14: qty 0.5

## 2022-09-14 MED ORDER — SODIUM CHLORIDE 0.9 % IV SOLN
500.0000 mg | Freq: Once | INTRAVENOUS | Status: AC
  Administered 2022-09-14: 500 mg via INTRAVENOUS
  Filled 2022-09-14: qty 2

## 2022-09-14 MED ORDER — CAFFEINE-SODIUM BENZOATE 125-125 MG/ML IJ SOLN
500.0000 mg | Freq: Once | INTRAMUSCULAR | Status: DC
  Filled 2022-09-14: qty 2

## 2022-09-14 MED ORDER — GADOBUTROL 1 MMOL/ML IV SOLN
7.0000 mL | Freq: Once | INTRAVENOUS | Status: AC | PRN
  Administered 2022-09-14: 7 mL via INTRAVENOUS

## 2022-09-14 NOTE — Progress Notes (Signed)
Neurology Progress Note  Brief HPI: Kelly Gregory is a 37 y.o. female with a past medical history of DM and HTN presenting with headache and nausea.  She was initially seen 12/29 for headache.  CT, CTA, and MRI were unremarkable.  An LP was done under fluoroscopy guidance as well that was bloody with RBCs clearing on subsequent tubes-not suggestive of subarachnoid hemorrhage.  She reports the headaches have been constant since then.  They have somewhat worsened after the LP, only worsened by position but otherwise have remained of the same quality.   Denies history of autoimmune symptoms, transient episodes of weakness, or vision loss.   On further detailed history, she reports that her initial headache started when she was urinating, not straining at all and not defecating but just sitting on the toilet and peeing comfortably.  It was so severe that she had to lay down on the floor of the bathroom until it abated enough that her husband was able to assist her back into bed.  Subsequently it improved while she was laying in bed she was able to eat some chocolates while laying down and felt better enough to engage in intimate activity.  However when she sat up to straddle her husband she again had a severe headache with nausea and vomiting.  No other bowel or bladder symptoms other than she has not had a bowel movement since Thursday (normally voids daily but also has not been eating and drinking much due to headache nausea and vomiting)  Subjective: Patient seen in room.   Exam: Vitals:   09/14/22 0500 09/14/22 0600  BP: 128/87 114/80  Pulse: 88 85  Resp: 19 12  Temp:    SpO2: 96% 92%   Gen: In bed, NAD Resp: non-labored breathing, no acute distress Abd: soft, nt Neurological exam Awake alert oriented not in distress while laying flat in bed, fluent speech No dysarthria No aphasia Cranial nerves II to XII intact No motor deficits, 5/5 throughout the lower extremities, no pronator  drift No sensory deficits No coordination deficits Reflexes 3+ in the bilateral biceps and brachioradialis, positive Hoffmann's bilaterally but more prominent on the left than the right, 2+ patellar's bilaterally, crossed adductors, no clonus  Pertinent Labs: Na - 133 Mag - pending  Imaging Reviewed:  CT Head Negative CT of the head.   CTA Head and Neck 1. 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 No aneurysm is present. 2. Normal CTA of the neck. 3. Normal CTA circle-of-Willis without significant proximal stenosis, aneurysm, or branch vessel occlusion.  CT Venogram 1. No evidence of dural venous sinus thrombosis. 2. No evidence of vascular malformation. 3. No pathologic enhancement.  12/29 MRI Brain Normal MRI of the brain   MRI brain with and without contrast 09/14/2022 10:25 AM Normal  Assessment:  Ms. Fitzner was seen recently for a headache work up and had an LP done on 12/29.  She reports her headache is the same as it was before the LP but the description of her headache currently is concerning for a low pressure headache as she states that her headache improves when she is lying flat. On arrival she had multiple episodes of emesis, received 10mg  of compazine at the bridge.  Head CT was unchanged from previous imaging on 12/29.   Initially felt that her headache may be secondary to lumbar puncture but she clearly reports positionality preceded the lumbar puncture.  Therefore it is not clear that a blood patch at the  level of the lumbar puncture would solve her problem, and I suspect she may end up needing a CT myelogram.  However given the significant neck pain she is complaining of and her hyperreflexia on exam I will start with an MRI of her cervical spine.  If MRI cervical spine is unrevealing we will discuss possible CT myelogram with Dr. Fabiola Backer tomorrow  Impression:  Concern for low pressure headache   Neck pain Component of left-sided occipital  neuralgia  Recommendations:  Concern for spontaneous CSF leak headache - MRI brain with and without contrast completed on recommendation, negative - Correct electrolyte derangements  - Caffiene 500mg  every 8 hours - IL NS q12hrs x 3 doses - Compazine 10mg  PRN for nausea  Neck pain - MRI cervical spine without contrast  Left-sided occipital neuralgia -Ice packs as tolerated  Patient seen and examined by NP/APP with MD. MD to update note as needed.   Janine Ores, DNP, FNP-BC [facility] Pager: [phone]  Attending Neurologist's note:  I personally saw this patient, gathering history, performing a full neurologic examination, reviewing relevant labs, personally reviewing relevant imaging including MRI brain, CTA CTV, and formulated the assessment and plan, adding the note above for completeness and clarity to accurately reflect my thoughts  Lesleigh Noe MD-PhD [facility] [phone]

## 2022-09-14 NOTE — Progress Notes (Signed)
PROGRESS NOTE  Kelly Gregory  ACZ:[id number] DOB: August 15, 1986 DOA: 09/13/2022 PCP: Pleas Koch, NP   Brief Narrative: Patient is a 37 year old female with history of hypertension, diabetes, rheumatoid arthritis, hyperlipidemia who presented here with severe headache.  Was seen in the ED twice in the last 48 hours before admission.  Neurology consulted.  Underwent extensive since including MRI, CTA, CT venogram without finding of significant findings.  Assessment & Plan:  Principal Problem:   Intractable headache Active Problems:   Type 2 diabetes mellitus with hyperglycemia (HCC)   Essential hypertension   Hyperlipemia   Migraines   Rheumatoid arthritis (HCC)   Intractable headache: Unclear etiology.  No history of migraines.  Neurology following.  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  Being given caffeine.  Lowering of head.  If headache persists, neurology planning for IR consultation for LP/blood patch  Diabetes type 2: Continue current insulin regimen.  Takes oral medications at home.  Monitor blood sugars  Hypertension: Currently normotensive.  Continue vitamin  Hyperlipidemia: On pravastatin  History of arthritis: On weekly Enbrel        DVT prophylaxis:SCDs Start: 09/13/22 2330     Code Status: Full Code  Family Communication: Husband at bedside  Patient status:Inpatient  Patient is from :Home  Anticipated discharge FU:XNAT  Estimated DC date: After resolution of headache   Consultants: Neurology  Procedures: None  Antimicrobials:  Anti-infectives (From admission, onward)    None       Subjective: Patient seen and examined at bedside today.  Hemodynamically stable.  She was having headache,started  just 10 minutes ago .before that she was doing good.  Denies nausea or vomiting  Objective: Vitals:    09/14/22 0500 09/14/22 0600 09/14/22 0916 09/14/22 1113  BP: 128/87 114/80 (!) 137/93   Pulse: 88 85 92   Resp: 19 12 14    Temp:   98.7 F (37.1 C)   TempSrc:   Oral   SpO2: 96% 92% 99%   Weight:      Height:    4\' 9"  (1.448 m)   No intake or output data in the 24 hours ending 09/14/22 1147 Filed Weights   09/13/22 1600  Weight: 74.6 kg    Examination:  General exam: Overall comfortable, not in distress HEENT: PERRL Respiratory system:  no wheezes or crackles  Cardiovascular system: S1 & S2 heard, RRR.  Gastrointestinal system: Abdomen is nondistended, soft and nontender. Central nervous system: Alert and oriented Extremities: No edema, no clubbing ,no cyanosis Skin: No rashes, no ulcers,no icterus     Data Reviewed: I have personally reviewed following labs and imaging studies  CBC: Recent Labs  Lab 09/11/22 0342 09/13/22 1604 09/13/22 1613 09/14/22 0500  WBC 8.1 8.4  --  9.1  NEUTROABS 4.3 5.4  --  7.1  HGB 16.0* 15.7* 15.3* 14.3  HCT 48.4* 45.4 45.0 41.8  MCV 83.6 83.3  --  82.8  PLT 376 348  --  557   Basic Metabolic Panel: Recent Labs  Lab 09/11/22 0342 09/13/22 1604 09/13/22 1613 09/14/22 0500  NA 137 135 138 133*  K 3.6 3.4* 3.4* 3.6  CL 101 104 103 102  CO2 24 21*  --  19*  GLUCOSE 144* 168* 167* 127*  BUN 11 8 8 8   CREATININE 0.50 0.70 0.50 0.66  CALCIUM 9.3 8.6*  --  8.2*  MG  --   --   --  1.8     No results found for this or any previous visit (from the past 240 hour(s)).   Radiology Studies: MR BRAIN W WO CONTRAST  Result Date: 09/14/2022 CLINICAL DATA:  37 year old female code stroke presentation yesterday with persistent headache and nausea. EXAM: MRI HEAD WITHOUT AND WITH CONTRAST TECHNIQUE: Multiplanar, multiecho pulse sequences of the brain and surrounding structures were obtained without and with intravenous contrast. CONTRAST:  94mL GADAVIST GADOBUTROL 1 MMOL/ML IV SOLN COMPARISON:  CTV, CTA head yesterday.  Prior MRI 09/11/2022.  FINDINGS: Brain: No restricted diffusion to suggest acute infarction. No midline shift, mass effect, evidence of mass lesion, ventriculomegaly, extra-axial collection or acute intracranial hemorrhage. Cervicomedullary junction and pituitary are within normal limits. Pearline Cables and white matter signal appears stable throughout the brain compared to 09/11/2022, normal for age (minimal nonspecific white matter T2 and FLAIR hyperintensity. No encephalomalacia or chronic cerebral blood products identified. Normal basilar cisterns. No abnormal enhancement identified. No abnormal dural thickening or enhancement at 3 Tesla imaging. Vascular: Major intracranial vascular flow voids are stable. Following contrast major dural venous sinuses remain patent and enhancing. Skull and upper cervical spine: Negative visible cervical spine. Hyperostosis of the calvarium, normal variant. Visualized bone marrow signal is within normal limits. Sinuses/Orbits: Stable, negative. Paranasal sinuses and mastoids are clear. Other: Visible internal auditory structures appear normal. Negative visible scalp and face. IMPRESSION: Stable and normal for age MRI appearance of the Brain. Electronically Signed   By: Genevie Ann M.D.   On: 09/14/2022 11:25   CT VENOGRAM HEAD  Result Date: 09/13/2022 CLINICAL DATA:  Headache.  Neuro deficit. EXAM: CT VENOGRAM HEAD TECHNIQUE: Venographic phase images of the brain were obtained following the administration of intravenous contrast. Multiplanar reformats and maximum intensity projections were generated. RADIATION DOSE REDUCTION: This exam was performed according to the departmental dose-optimization program which includes automated exposure control, adjustment of the mA and/or kV according to patient size and/or use of iterative reconstruction technique. CONTRAST:  41mL OMNIPAQUE IOHEXOL 350 MG/ML SOLN COMPARISON:  CTA head and neck 09/13/2022 and 09/11/2022. FINDINGS: Delayed images demonstrate no pathologic  enhancement. The dural sinuses are patent. The straight sinus and deep cerebral veins are intact. Cortical veins are within normal limits. No significant vascular malformation is evident. The right transverse sinus is dominant. Cavernous sinus is within normal limits bilaterally. Superior ophthalmic veins are normal bilaterally. IMPRESSION: 1. No evidence of dural venous sinus thrombosis. 2. No evidence of vascular malformation. 3. No pathologic enhancement. Electronically Signed   By: San Morelle M.D.   On: 09/13/2022 19:14   CT ANGIO HEAD NECK W WO CM  Result Date: 09/13/2022 CLINICAL DATA:  Persistent headaches. Nausea. Headaches are worsened with position following the lumbar puncture. EXAM: CT ANGIOGRAPHY HEAD AND NECK TECHNIQUE: Multidetector CT imaging of the head and neck was performed using the standard protocol during bolus administration of intravenous contrast. Multiplanar CT image reconstructions and MIPs were obtained to evaluate the vascular anatomy. Carotid stenosis measurements (when applicable) are obtained utilizing NASCET criteria, using the distal internal carotid diameter as the denominator. RADIATION DOSE REDUCTION: This exam was performed according to the departmental dose-optimization program which includes automated exposure control, adjustment of the mA and/or kV according to patient size and/or use of iterative reconstruction technique. CONTRAST:  74mL OMNIPAQUE IOHEXOL 350 MG/ML SOLN COMPARISON:  CTA head and neck 09/11/2022 FINDINGS: CTA NECK FINDINGS Aortic arch: A 3 vessel arch configuration is present. No significant atherosclerotic change or stenosis is present. Right carotid system:  The right common carotid artery is within normal limits. Bifurcation is unremarkable. Cervical right ICA is normal. Left carotid system: The left common carotid artery is within normal limits. Bifurcation is unremarkable. Cervical left ICA is normal. Vertebral arteries: The left vertebral  artery is dominant. Both vertebral arteries originate from the subclavian arteries without significant stenosis. No significant stenosis is present in either vertebral artery in the neck. Skeleton: Vertebral body heights and alignment are normal. No focal osseous lesions are present. Other neck: Soft tissues the neck are otherwise unremarkable. Salivary glands are within normal limits. Thyroid is normal. No significant adenopathy is present. No focal mucosal or submucosal lesions are present. Upper chest: Mild dependent atelectasis is present in the lungs. Lungs are otherwise clear. Review of the MIP images confirms the above findings CTA HEAD FINDINGS Anterior circulation: The internal carotid arteries are within normal limits through the ICA termini. Left A1 segment is dominant. Azygous A2 anatomy again noted. 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 Small branches arise from this point. No aneurysm is present. MCA bifurcations are within normal limits. ACA and MCA branch vessels are within normal limits bilaterally. Posterior circulation: The left vertebral artery is dominant vessel. PICA origins are visualized and normal. Vertebrobasilar junction basilar artery are normal. Both posterior cerebral arteries originate from basilar tip. PCA branch vessels are within normal limits bilaterally. Venous sinuses: The dural sinuses are patent. The straight sinus and deep cerebral veins are intact. Cortical veins are within normal limits. No significant vascular malformation is evident. Right transverse sinus is dominant. Review of the MIP images confirms the above findings IMPRESSION: 1. 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 No aneurysm is present. 2. Normal CTA of the neck. 3. Normal CTA circle-of-Willis without significant proximal stenosis, aneurysm, or branch vessel occlusion. Electronically Signed   By: San Morelle M.D.   On: 09/13/2022 18:58   CT  HEAD CODE STROKE WO CONTRAST  Result Date: 09/13/2022 CLINICAL DATA:  Code stroke. Neuro deficit, acute, stroke suspected. EXAM: CT HEAD WITHOUT CONTRAST TECHNIQUE: Contiguous axial images were obtained from the base of the skull through the vertex without intravenous contrast. RADIATION DOSE REDUCTION: This exam was performed according to the departmental dose-optimization program which includes automated exposure control, adjustment of the mA and/or kV according to patient size and/or use of iterative reconstruction technique. COMPARISON:  CT head without contrast, CTA head and neck and MR head without contrast 09/11/2022 FINDINGS: Brain: No acute infarct, hemorrhage, or mass lesion is present. No significant white matter lesions are present. Deep brain nuclei are within normal limits. The ventricles are of normal size. No significant extraaxial fluid collection is present. Vascular: No hyperdense vessel or unexpected calcification. Skull: Calvarium is intact. No focal lytic or blastic lesions are present. No significant extracranial soft tissue lesion is present. Sinuses/Orbits: The paranasal sinuses and mastoid air cells are clear. The globes and orbits are within normal limits. IMPRESSION: Negative CT of the head. Electronically Signed   By: San Morelle M.D.   On: 09/13/2022 16:18    Scheduled Meds:  insulin aspart  0-15 Units Subcutaneous Q6H   Continuous Infusions:  caffeine-sodium benzoate ADULT 500 mg in sodium chloride 0.9 % 1,000 mL IVPB     sodium chloride 1,000 mL (09/14/22 1040)     LOS: 1 day   Shelly Coss, MD [facility] P1/09/2022, 11:47 AM

## 2022-09-15 DIAGNOSIS — G9600 Cerebrospinal fluid leak, unspecified: Secondary | ICD-10-CM | POA: Diagnosis not present

## 2022-09-15 DIAGNOSIS — M5481 Occipital neuralgia: Secondary | ICD-10-CM | POA: Diagnosis not present

## 2022-09-15 DIAGNOSIS — R519 Headache, unspecified: Secondary | ICD-10-CM | POA: Diagnosis not present

## 2022-09-15 LAB — GLUCOSE, CAPILLARY
Glucose-Capillary: 112 mg/dL — ABNORMAL HIGH (ref 70–99)
Glucose-Capillary: 152 mg/dL — ABNORMAL HIGH (ref 70–99)
Glucose-Capillary: 96 mg/dL (ref 70–99)
Glucose-Capillary: 99 mg/dL (ref 70–99)

## 2022-09-15 LAB — BASIC METABOLIC PANEL
Anion gap: 10 (ref 5–15)
BUN: 6 mg/dL (ref 6–20)
CO2: 20 mmol/L — ABNORMAL LOW (ref 22–32)
Calcium: 8.2 mg/dL — ABNORMAL LOW (ref 8.9–10.3)
Chloride: 105 mmol/L (ref 98–111)
Creatinine, Ser: 0.62 mg/dL (ref 0.44–1.00)
GFR, Estimated: >60 mL/min (ref >60)
Glucose, Bld: 122 mg/dL — ABNORMAL HIGH (ref 70–99)
Potassium: 3.3 mmol/L — ABNORMAL LOW (ref 3.5–5.1)
Sodium: 135 mmol/L (ref 135–145)

## 2022-09-15 LAB — HEMOGLOBIN A1C
Hgb A1c MFr Bld: 6.6 % — ABNORMAL HIGH (ref 4.8–5.6)
Mean Plasma Glucose: 143 mg/dL

## 2022-09-15 MED ORDER — SODIUM CHLORIDE 0.9 % IV SOLN
250.0000 mg | Freq: Three times a day (TID) | INTRAVENOUS | Status: DC
  Administered 2022-09-15 – 2022-09-16 (×3): 250 mg via INTRAVENOUS
  Filled 2022-09-15 (×5): qty 1

## 2022-09-15 MED ORDER — SODIUM CHLORIDE 0.9 % IV BOLUS
500.0000 mL | Freq: Two times a day (BID) | INTRAVENOUS | Status: DC
  Administered 2022-09-15: 500 mL via INTRAVENOUS

## 2022-09-15 MED ORDER — POTASSIUM CHLORIDE CRYS ER 20 MEQ PO TBCR
40.0000 meq | EXTENDED_RELEASE_TABLET | Freq: Once | ORAL | Status: AC
  Administered 2022-09-15: 40 meq via ORAL
  Filled 2022-09-15: qty 2

## 2022-09-15 MED ORDER — NIFEDIPINE ER OSMOTIC RELEASE 30 MG PO TB24
30.0000 mg | ORAL_TABLET | Freq: Every day | ORAL | Status: DC
  Administered 2022-09-15 – 2022-09-16 (×2): 30 mg via ORAL
  Filled 2022-09-15 (×3): qty 1

## 2022-09-15 NOTE — Progress Notes (Signed)
  Transition of Care [facility]) Screening Note   Patient Details  Name: Kelly Gregory Date of Birth: [birth year]   Transition of Care [facility]) CM/SW Contact:    Pollie Friar, RN Phone Number: 09/15/2022, 2:44 PM    Transition of Care Department [facility]) has reviewed patient and no TOC needs have been identified at this time. We will continue to monitor patient advancement through interdisciplinary progression rounds. If new patient transition needs arise, please place a TOC consult.

## 2022-09-15 NOTE — Progress Notes (Addendum)
PROGRESS NOTE  BRICELYN FREESTONE  WRU:[id number] DOB: Feb 14, 1986 DOA: 09/13/2022 PCP: Pleas Koch, NP   Brief Narrative: Patient is a 37 year old female with history of hypertension, diabetes, rheumatoid arthritis, hyperlipidemia who presented here with severe headache.  Was seen in the ED twice in the last 48 hours before admission.  Neurology consulted.  Underwent extensive since including MRI, CTA, CT venogram without finding of significant findings.  Headache better.  Caffeine being tapered.  Plan for discharge home tomorrow if free of headache  Assessment & Plan:  Principal Problem:   Intractable headache Active Problems:   Type 2 diabetes mellitus with hyperglycemia (HCC)   Essential hypertension   Hyperlipemia   Migraines   Rheumatoid arthritis (HCC)   Intractable headache: Unclear etiology.  No history of migraines.  She was seen in the ED prior to this admission and lumbar puncture was done on 12/29 then discharged but came back again with severe headache.  There was concern for a spontaneous CSF leak causing headache but she was having headache before that. Neurology following.  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  Being given caffeine.  Caffeine being tapered.  Headache is better today.  Diabetes type 2: Continue current insulin regimen.  Takes oral medications at home.  Monitor blood sugars  Hypertension: .  Continue nifedipine  Hyperlipidemia: On pravastatin  History of arthritis: On weekly Enbrel  Hypokalemia: Supplement with potassium  Obesity: BMI of 35.5        DVT prophylaxis:SCDs Start: 09/13/22 2330     Code Status: Full Code  Family Communication: Husband at bedside  Patient status:Inpatient  Patient is from :Home  Anticipated discharge BJ:YNWG  Estimated DC date: After resolution  of headache, tomorrow   Consultants: Neurology  Procedures: None  Antimicrobials:  Anti-infectives (From admission, onward)    None       Subjective: Patient seen and examined at bedside today.  Hemodynamically stable.  She feels much better today.  Headache is significantly better. Objective: Vitals:   09/14/22 2100 09/15/22 0048 09/15/22 0324 09/15/22 0754  BP: 132/88 (!) 146/93 (!) 165/106 (!) 141/113  Pulse: 74 79 70 84  Resp: 18 18 19 17   Temp: 97.9 F (36.6 C) 98.1 F (36.7 C) 97.6 F (36.4 C) 98.8 F (37.1 C)  TempSrc: Oral Oral Oral Oral  SpO2: 97% 100% 100% 100%  Weight:      Height:        Intake/Output Summary (Last 24 hours) at 09/15/2022 1147 Last data filed at 09/15/2022 0500 Gross per 24 hour  Intake --  Output 5100 ml  Net -5100 ml   Filed Weights   09/13/22 1600  Weight: 74.6 kg    Examination:  General exam: Overall comfortable, not in distress,obese HEENT: PERRL Respiratory system:  no wheezes or crackles  Cardiovascular system: S1 & S2 heard, RRR.  Gastrointestinal system: Abdomen is nondistended, soft and nontender. Central nervous system: Alert and oriented Extremities: No edema, no clubbing ,no cyanosis Skin: No rashes, no ulcers,no icterus     Data Reviewed: I have personally reviewed following labs and imaging studies  CBC: Recent Labs  Lab 09/11/22 0342 09/13/22 1604 09/13/22 1613 09/14/22 0500  WBC 8.1 8.4  --  9.1  NEUTROABS 4.3 5.4  --  7.1  HGB 16.0* 15.7* 15.3* 14.3  HCT 48.4* 45.4 45.0 41.8  MCV 83.6 83.3  --  82.8  PLT 376 348  --  99991111   Basic Metabolic Panel: Recent Labs  Lab 09/11/22 0342 09/13/22 1604 09/13/22 1613 09/14/22 0500 09/15/22 0408  NA 137 135 138 133* 135  K 3.6 3.4* 3.4* 3.6 3.3*  CL 101 104 103 102 105  CO2 24 21*  --  19* 20*  GLUCOSE 144* 168* 167* 127* 122*  BUN 11 8 8 8 6   CREATININE 0.50 0.70 0.50 0.66 0.62  CALCIUM 9.3 8.6*  --  8.2* 8.2*  MG  --   --   --  1.8  --      No  results found for this or any previous visit (from the past 240 hour(s)).   Radiology Studies: MR CERVICAL SPINE WO CONTRAST  Result Date: 09/14/2022 CLINICAL DATA:  Cervical myelopathy EXAM: MRI CERVICAL SPINE WITHOUT CONTRAST TECHNIQUE: Multiplanar, multisequence MR imaging of the cervical spine was performed. No intravenous contrast was administered. COMPARISON:  None Available. FINDINGS: Alignment: Physiologic. Vertebrae: No fracture, evidence of discitis, or bone lesion. Cord: Normal signal and morphology. Posterior Fossa, vertebral arteries, paraspinal tissues: Negative. Disc levels: C1-2: Unremarkable. C2-3: Normal disc space and facet joints. There is no spinal canal stenosis. No neural foraminal stenosis. C3-4: Normal disc space and facet joints. There is no spinal canal stenosis. No neural foraminal stenosis. C4-5: Normal disc space and facet joints. There is no spinal canal stenosis. No neural foraminal stenosis. C5-6: Normal disc space and facet joints. There is no spinal canal stenosis. No neural foraminal stenosis. C6-7: Normal disc space and facet joints. There is no spinal canal stenosis. No neural foraminal stenosis. C7-T1: Normal disc space and facet joints. There is no spinal canal stenosis. No neural foraminal stenosis. IMPRESSION: Normal MRI of the cervical spine. Electronically Signed   By: Ulyses Jarred M.D.   On: 09/14/2022 19:55   MR BRAIN W WO CONTRAST  Result Date: 09/14/2022 CLINICAL DATA:  37 year old female code stroke presentation yesterday with persistent headache and nausea. EXAM: MRI HEAD WITHOUT AND WITH CONTRAST TECHNIQUE: Multiplanar, multiecho pulse sequences of the brain and surrounding structures were obtained without and with intravenous contrast. CONTRAST:  37mL GADAVIST GADOBUTROL 1 MMOL/ML IV SOLN COMPARISON:  CTV, CTA head yesterday.  Prior MRI 09/11/2022. FINDINGS: Brain: No restricted diffusion to suggest acute infarction. No midline shift, mass effect, evidence  of mass lesion, ventriculomegaly, extra-axial collection or acute intracranial hemorrhage. Cervicomedullary junction and pituitary are within normal limits. Pearline Cables and white matter signal appears stable throughout the brain compared to 09/11/2022, normal for age (minimal nonspecific white matter T2 and FLAIR hyperintensity. No encephalomalacia or chronic cerebral blood products identified. Normal basilar cisterns. No abnormal enhancement identified. No abnormal dural thickening or enhancement at 3 Tesla imaging. Vascular: Major intracranial vascular flow voids are stable. Following contrast major dural venous sinuses remain patent and enhancing. Skull and upper cervical spine: Negative visible cervical spine. Hyperostosis of the calvarium, normal variant. Visualized bone marrow signal is within normal limits. Sinuses/Orbits: Stable, negative. Paranasal sinuses and mastoids are clear. Other: Visible internal auditory structures appear normal. Negative visible scalp and face. IMPRESSION: Stable and normal for age MRI appearance of the Brain. Electronically Signed   By: Genevie Ann M.D.   On: 09/14/2022 11:25   CT VENOGRAM HEAD  Result Date: 09/13/2022 CLINICAL DATA:  Headache.  Neuro deficit. EXAM: CT VENOGRAM HEAD TECHNIQUE: Venographic phase images of the brain were obtained following the administration of intravenous contrast. Multiplanar reformats and maximum intensity projections were generated. RADIATION  DOSE REDUCTION: This exam was performed according to the departmental dose-optimization program which includes automated exposure control, adjustment of the mA and/or kV according to patient size and/or use of iterative reconstruction technique. CONTRAST:  68mL OMNIPAQUE IOHEXOL 350 MG/ML SOLN COMPARISON:  CTA head and neck 09/13/2022 and 09/11/2022. FINDINGS: Delayed images demonstrate no pathologic enhancement. The dural sinuses are patent. The straight sinus and deep cerebral veins are intact. Cortical veins  are within normal limits. No significant vascular malformation is evident. The right transverse sinus is dominant. Cavernous sinus is within normal limits bilaterally. Superior ophthalmic veins are normal bilaterally. IMPRESSION: 1. No evidence of dural venous sinus thrombosis. 2. No evidence of vascular malformation. 3. No pathologic enhancement. Electronically Signed   By: San Morelle M.D.   On: 09/13/2022 19:14   CT ANGIO HEAD NECK W WO CM  Result Date: 09/13/2022 CLINICAL DATA:  Persistent headaches. Nausea. Headaches are worsened with position following the lumbar puncture. EXAM: CT ANGIOGRAPHY HEAD AND NECK TECHNIQUE: Multidetector CT imaging of the head and neck was performed using the standard protocol during bolus administration of intravenous contrast. Multiplanar CT image reconstructions and MIPs were obtained to evaluate the vascular anatomy. Carotid stenosis measurements (when applicable) are obtained utilizing NASCET criteria, using the distal internal carotid diameter as the denominator. RADIATION DOSE REDUCTION: This exam was performed according to the departmental dose-optimization program which includes automated exposure control, adjustment of the mA and/or kV according to patient size and/or use of iterative reconstruction technique. CONTRAST:  82mL OMNIPAQUE IOHEXOL 350 MG/ML SOLN COMPARISON:  CTA head and neck 09/11/2022 FINDINGS: CTA NECK FINDINGS Aortic arch: A 3 vessel arch configuration is present. No significant atherosclerotic change or stenosis is present. Right carotid system: The right common carotid artery is within normal limits. Bifurcation is unremarkable. Cervical right ICA is normal. Left carotid system: The left common carotid artery is within normal limits. Bifurcation is unremarkable. Cervical left ICA is normal. Vertebral arteries: The left vertebral artery is dominant. Both vertebral arteries originate from the subclavian arteries without significant stenosis.  No significant stenosis is present in either vertebral artery in the neck. Skeleton: Vertebral body heights and alignment are normal. No focal osseous lesions are present. Other neck: Soft tissues the neck are otherwise unremarkable. Salivary glands are within normal limits. Thyroid is normal. No significant adenopathy is present. No focal mucosal or submucosal lesions are present. Upper chest: Mild dependent atelectasis is present in the lungs. Lungs are otherwise clear. Review of the MIP images confirms the above findings CTA HEAD FINDINGS Anterior circulation: The internal carotid arteries are within normal limits through the ICA termini. Left A1 segment is dominant. Azygous A2 anatomy again noted. 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 Small branches arise from this point. No aneurysm is present. MCA bifurcations are within normal limits. ACA and MCA branch vessels are within normal limits bilaterally. Posterior circulation: The left vertebral artery is dominant vessel. PICA origins are visualized and normal. Vertebrobasilar junction basilar artery are normal. Both posterior cerebral arteries originate from basilar tip. PCA branch vessels are within normal limits bilaterally. Venous sinuses: The dural sinuses are patent. The straight sinus and deep cerebral veins are intact. Cortical veins are within normal limits. No significant vascular malformation is evident. Right transverse sinus is dominant. Review of the MIP images confirms the above findings IMPRESSION: 1. 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 No aneurysm is present. 2. Normal CTA of the neck.  3. Normal CTA circle-of-Willis without significant proximal stenosis, aneurysm, or branch vessel occlusion. Electronically Signed   By: San Morelle M.D.   On: 09/13/2022 18:58   CT HEAD CODE STROKE WO CONTRAST  Result Date: 09/13/2022 CLINICAL DATA:  Code stroke. Neuro deficit, acute,  stroke suspected. EXAM: CT HEAD WITHOUT CONTRAST TECHNIQUE: Contiguous axial images were obtained from the base of the skull through the vertex without intravenous contrast. RADIATION DOSE REDUCTION: This exam was performed according to the departmental dose-optimization program which includes automated exposure control, adjustment of the mA and/or kV according to patient size and/or use of iterative reconstruction technique. COMPARISON:  CT head without contrast, CTA head and neck and MR head without contrast 09/11/2022 FINDINGS: Brain: No acute infarct, hemorrhage, or mass lesion is present. No significant white matter lesions are present. Deep brain nuclei are within normal limits. The ventricles are of normal size. No significant extraaxial fluid collection is present. Vascular: No hyperdense vessel or unexpected calcification. Skull: Calvarium is intact. No focal lytic or blastic lesions are present. No significant extracranial soft tissue lesion is present. Sinuses/Orbits: The paranasal sinuses and mastoid air cells are clear. The globes and orbits are within normal limits. IMPRESSION: Negative CT of the head. Electronically Signed   By: San Morelle M.D.   On: 09/13/2022 16:18    Scheduled Meds:  insulin aspart  0-15 Units Subcutaneous Q6H   NIFEdipine  30 mg Oral Daily   pneumococcal 23 valent vaccine  0.5 mL Intramuscular Tomorrow-1000   Continuous Infusions:  caffeine-sodium benzoate ADULT 250 mg in sodium chloride 0.9 % 1,000 mL IVPB     sodium chloride       LOS: 2 days   Shelly Coss, MD [facility] P1/10/2022, 11:47 AM

## 2022-09-15 NOTE — Progress Notes (Signed)
Neurology Progress Note  Brief HPI: Kelly Gregory is a 37 y.o. female with a past medical history of DM and HTN presenting with headache and nausea.  She was initially seen 12/29 for headache.  CT, CTA, and MRI were unremarkable.  An LP was done under fluoroscopy guidance as well that was bloody with RBCs clearing on subsequent tubes-not suggestive of subarachnoid hemorrhage.  She reports the headaches have been constant since then.  They have somewhat worsened after the LP, only worsened by position but otherwise have remained of the same quality.   Denies history of autoimmune symptoms, transient episodes of weakness, or vision loss.   On further detailed history, she reports that her initial headache started when she was urinating, not straining at all and not defecating but just sitting on the toilet and peeing comfortably.  It was so severe that she had to lay down on the floor of the bathroom until it abated enough that her husband was able to assist her back into bed.  Subsequently it improved while she was laying in bed she was able to eat some chocolates while laying down and felt better enough to engage in intimate activity.  However when she sat up to straddle her husband she again had a severe headache with nausea and vomiting.  No other bowel or bladder symptoms other than she has not had a bowel movement since Thursday (normally voids daily but also has not been eating and drinking much due to headache nausea and vomiting)  Subjective: No headache laying flat or at 15 deg when elevated to eat breakfast Hypertensive and has had some palpitations anxiety due to caffeine Headache increased to 5/10 from 1/10 with sitting up and ambulating 5 steps with me, but "tolerable"   Exam: Current vital signs: BP (!) 141/113 (BP Location: Left Arm)   Pulse 84   Temp 98.8 F (37.1 C) (Oral)   Resp 17   Ht 4\' 9"  (1.448 m)   Wt 74.6 kg   LMP 09/10/2022   SpO2 100%   BMI 35.59 kg/m  Vital  signs in last 24 hours: Temp:  [97.6 F (36.4 C)-98.8 F (37.1 C)] 98.8 F (37.1 C) (01/02 0754) Pulse Rate:  [61-94] 84 (01/02 0754) Resp:  [17-19] 17 (01/02 0754) BP: (124-165)/(81-113) 141/113 (01/02 0754) SpO2:  [97 %-100 %] 100 % (01/02 0754)   Gen: In bed, NAD Resp: non-labored breathing, no acute distress Abd: soft, nt Neurological exam Awake alert oriented not in distress while laying flat in bed, fluent speech No dysarthria No aphasia Cranial nerves II to XII intact (subtle stable R facial droop) No pronator drift, no truncal ataxia, steady casual gait  Pertinent Labs: Na - 133 Mag - pending  Imaging Reviewed:  CT Head Negative CT of the head.   CTA Head and Neck 1. 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 No aneurysm is present. 2. Normal CTA of the neck. 3. Normal CTA circle-of-Willis without significant proximal stenosis, aneurysm, or branch vessel occlusion.  CT Venogram 1. No evidence of dural venous sinus thrombosis. 2. No evidence of vascular malformation. 3. No pathologic enhancement.  12/29 MRI Brain Normal MRI of the brain   MRI brain with and without contrast 09/14/2022 10:25 AM Normal  MRI C-spine personally reviewed, agree with radiology:   Reassuring  Assessment:  Continue to suspect spontaneous CSF leak as the initial etiology but currently improving with supportive care. Will taper caffeine today and continue to monitor clinically. Discussed  with radiology, outpatient CT myelogram / blood patch might be preferable due to equipment differences, if patient symptoms allow. Will reach out to [facility] for appointment.    Impression:  Concern for low pressure headache   Neck pain Component of left-sided occipital neuralgia  Recommendations:  Concern for spontaneous CSF leak headache, improving with supportive care - MRI brain with and without contrast completed on recommendation, negative - Appreciate management of  electrolyte derangements per primary team - Caffiene 250 mg every 8 hours for now - 500 mL NS q12hrs x 3 doses - Compazine 10mg  PRN for nausea  Neck pain - MRI cervical spine without contrast completed, negative  Left-sided occipital neuralgia, improving -Ice packs as tolerated  Neurology will continue to follow, recommendations conveyed via secure chat to primary team  Lesleigh Noe MD-PhD [facility] [phone]

## 2022-09-16 ENCOUNTER — Other Ambulatory Visit: Payer: Self-pay | Admitting: Neurology

## 2022-09-16 DIAGNOSIS — G9600 Cerebrospinal fluid leak, unspecified: Secondary | ICD-10-CM

## 2022-09-16 DIAGNOSIS — M5481 Occipital neuralgia: Secondary | ICD-10-CM

## 2022-09-16 DIAGNOSIS — G43909 Migraine, unspecified, not intractable, without status migrainosus: Secondary | ICD-10-CM | POA: Diagnosis not present

## 2022-09-16 HISTORY — DX: Cerebrospinal fluid leak, unspecified: G96.00

## 2022-09-16 HISTORY — DX: Occipital neuralgia: M54.81

## 2022-09-16 LAB — BASIC METABOLIC PANEL
Anion gap: 9 (ref 5–15)
BUN: 6 mg/dL (ref 6–20)
CO2: 20 mmol/L — ABNORMAL LOW (ref 22–32)
Calcium: 8.3 mg/dL — ABNORMAL LOW (ref 8.9–10.3)
Chloride: 104 mmol/L (ref 98–111)
Creatinine, Ser: 0.6 mg/dL (ref 0.44–1.00)
GFR, Estimated: >60 mL/min (ref >60)
Glucose, Bld: 120 mg/dL — ABNORMAL HIGH (ref 70–99)
Potassium: 3.2 mmol/L — ABNORMAL LOW (ref 3.5–5.1)
Sodium: 133 mmol/L — ABNORMAL LOW (ref 135–145)

## 2022-09-16 LAB — GLUCOSE, CAPILLARY
Glucose-Capillary: 124 mg/dL — ABNORMAL HIGH (ref 70–99)
Glucose-Capillary: 146 mg/dL — ABNORMAL HIGH (ref 70–99)

## 2022-09-16 MED ORDER — POTASSIUM CHLORIDE CRYS ER 20 MEQ PO TBCR: 40.0000 meq | EXTENDED_RELEASE_TABLET | Freq: Every day | ORAL | 0 refills | Status: DC

## 2022-09-16 MED ORDER — CAFFEINE 200 MG PO TABS: 200.0000 mg | ORAL_TABLET | Freq: Once | ORAL | Status: DC

## 2022-09-16 MED ORDER — POTASSIUM CHLORIDE CRYS ER 20 MEQ PO TBCR
40.0000 meq | EXTENDED_RELEASE_TABLET | Freq: Once | ORAL | Status: AC
  Administered 2022-09-16: 40 meq via ORAL
  Filled 2022-09-16: qty 2

## 2022-09-16 MED ORDER — CAFFEINE 200 MG PO TABS: 100.0000 mg | ORAL_TABLET | Freq: Once | ORAL | Status: DC

## 2022-09-16 MED ORDER — CAFFEINE 200 MG PO TABS: 100.0000 mg | ORAL_TABLET | Freq: Once | ORAL | 0 refills | Status: DC

## 2022-09-16 MED ORDER — CAFFEINE 200 MG PO TABS: 200.0000 mg | ORAL_TABLET | Freq: Once | ORAL | 0 refills | Status: DC

## 2022-09-16 NOTE — Discharge Instructions (Addendum)
Kelly Gregory, your symptoms are consistent with CSF leak headache, though we did not identify a clear trigger (often these do occur after lumbar puncture but your headache started even before lumbar puncture was completed).   Instructions:  - Continue to drink as much water and caffeine as possible  - Taper caffeine gradually at least 500 mg today, 250 tomorrow, 125 the next day, 65 the day after, can try to front load dose in the morning to improve tolerability - If symptoms worsen, increase caffeine intake and fluid intake, lay flat; if severe symptoms or symptoms not improving with these measures, return to hospital - Avoid straining strictly, continue to rest flat as much as possible and slowly increase activity - Do NOT lift > 5 lbs at this time - Use stool softeners as needed to avoid an straining with bowel movements - Close outpatient followup with [facility], referral placed. If you do not hear from them, please call  [phone] for an appointment - Outpatient appointment with [facility] at [place] for CT myelogram of the spine and possible blood patch, referral placed

## 2022-09-16 NOTE — TOC Transition Note (Signed)
Transition of Care [facility]) - CM/SW Discharge Note   Patient Details  Name: Kelly Gregory MRN: [id number] Date of Birth: September 19, 1985  [facility] CM/SW Contact:  Pollie Friar, RN Phone Number: 09/16/2022, 10:08 AM   Clinical Narrative:    Pt is discharging home with self care. No needs per TOC.   Final next level of care: Home/Self Care Barriers to Discharge: No Barriers Identified   Patient Goals and CMS Choice      Discharge Placement                         Discharge Plan and Services Additional resources added to the After Visit Summary for                                       Social Determinants of Health (SDOH) Interventions SDOH Screenings   Food Insecurity: No Food Insecurity (09/14/2022)  Housing: Low Risk  (09/14/2022)  Transportation Needs: No Transportation Needs (09/14/2022)  Utilities: Not At Risk (09/14/2022)  Depression (PHQ2-9): Low Risk  (11/26/2021)  Financial Resource Strain: Low Risk  (03/16/2019)  Stress: No Stress Concern Present (03/16/2019)  Tobacco Use: Low Risk  (09/13/2022)     Readmission Risk Interventions     No data to display

## 2022-09-16 NOTE — Discharge Summary (Signed)
Physician Discharge Summary  Kelly Gregory [id number] DOB: 04-Aug-1986 DOA: 09/13/2022  PCP: Pleas Koch, NP  Admit date: 09/13/2022 Discharge date: 09/16/2022  Admitted From: Home Disposition:  Home  Discharge Condition:Stable CODE STATUS:FULL Diet recommendation: Heart Healthy Brief/Interim Summary: Patient is a 37 year old female with history of hypertension, diabetes, rheumatoid arthritis, hyperlipidemia who presented here with severe headache.  Was seen in the ED twice in the last 48 hours before admission.  Neurology consulted.  Underwent extensive since including MRI, CTA, CT venogram without finding of significant findings.  Headache better.  Caffeine being tapered.  Plan for discharge home today to home with outpatient follow-up with neurology and [facility] imaging.  Following  problems were addressed during her hospitalization:  Intractable headache: Unclear etiology.  No history of migraines.  She was seen in the ED prior to this admission and lumbar puncture was done on 12/29 then discharged but came back again with severe headache.  There was concern for a spontaneous CSF leak causing headache but she was having headache before that. Neurology following.  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  Being given caffeine.  Caffeine being tapered.  Headache is better today. She will follow-up with outpatient neurology.  Appointment scheduled. She will also  have outpatient appointment with [facility] at [place] for CT myelogram and possible blood patch, referral placed     Diabetes type 2:  Takes oral medications at home.  Monitor blood sugars   Hypertension: .  Continue nifedipine   Hyperlipidemia: On pravastatin   History of arthritis: On weekly Enbrel   Hypokalemia: Supplemented with  potassium   Obesity: BMI of 35.5  Discharge Diagnoses:  Active Problems:   Type 2 diabetes mellitus with hyperglycemia (HCC)   Essential hypertension   Hyperlipemia   Migraines   Rheumatoid arthritis (HCC)   CSF leak   Occipital neuralgia of left side    Discharge Instructions  Discharge Instructions     Ambulatory referral to Neurology   Complete by: As directed    An appointment is requested in approximately: 1 week Please schedule ASAP with Dr. Jaynee Eagles or Dr. Billey Gosling, CT myelogram pending   Diet - low sodium heart healthy   Complete by: As directed    Discharge instructions   Complete by: As directed    1)Please take prescribed medications as instructed 2)Continue aggressive oral fluid intake as tolerated - Avoid straining strictly, continue to rest flat as much as possible and slowly increase activity - Close outpatient followup with [facility], referral placed - Outpatient appointment with [facility] at [place] for CT myelogram and possible blood patch, referral placed   Increase activity slowly   Complete by: As directed       Allergies as of 09/16/2022       Reactions   Azithromycin Nausea And Vomiting   Gatifloxacin Nausea And Vomiting   Tequin Nausea And Vomiting   Penicillins Hives, Rash, Other (See Comments)   Has patient had a PCN reaction causing immediate rash, facial/tongue/throat swelling, SOB or lightheadedness with hypotension: Unknown Has patient had a PCN reaction causing severe rash involving mucus membranes or skin necrosis: Unknown Has patient had a PCN reaction that required hospitalization: Unknown Has patient had a PCN reaction occurring within the last 10 years: No If all of the above answers are "NO", then may proceed  with Cephalosporin use.        Medication List     TAKE these medications    caffeine 200 MG Tabs tablet Take 1 tablet (200 mg total) by mouth once for 1 dose. Start taking on: September 17, 2022    caffeine 200 MG Tabs tablet Take 0.5 tablets (100 mg total) by mouth once for 1 dose. Start taking on: January 5, 123456   Enbrel SureClick 50 MG/ML injection Generic drug: etanercept Inject 50 mg into the skin once a week.   metFORMIN 500 MG 24 hr tablet Commonly known as: GLUCOPHAGE-XR TAKE 1 TABLET BY MOUTH DAILY WITH BREAKFAST FOR DIABETES What changed: See the new instructions.   NIFEdipine 30 MG 24 hr tablet Commonly known as: PROCARDIA-XL/NIFEDICAL-XL TAKE 1 TABLET BY MOUTH EVERY DAY FOR BLOOD PRESSURE What changed: See the new instructions.   potassium chloride SA 20 MEQ tablet Commonly known as: KLOR-CON M Take 2 tablets (40 mEq total) by mouth daily for 3 days. Start taking on: September 17, 2022   pravastatin 40 MG tablet Commonly known as: PRAVACHOL TAKE 1 TABLET BY MOUTH EVERY DAY FOR CHOLESTEROL        Allergies  Allergen Reactions   Azithromycin Nausea And Vomiting   Gatifloxacin Nausea And Vomiting   Tequin Nausea And Vomiting   Penicillins Hives, Rash and Other (See Comments)    Has patient had a PCN reaction causing immediate rash, facial/tongue/throat swelling, SOB or lightheadedness with hypotension: Unknown Has patient had a PCN reaction causing severe rash involving mucus membranes or skin necrosis: Unknown Has patient had a PCN reaction that required hospitalization: Unknown Has patient had a PCN reaction occurring within the last 10 years: No If all of the above answers are "NO", then may proceed with Cephalosporin use.     Consultations: Neurology   Procedures/Studies: MR CERVICAL SPINE WO CONTRAST  Result Date: 09/14/2022 CLINICAL DATA:  Cervical myelopathy EXAM: MRI CERVICAL SPINE WITHOUT CONTRAST TECHNIQUE: Multiplanar, multisequence MR imaging of the cervical spine was performed. No intravenous contrast was administered. COMPARISON:  None Available. FINDINGS: Alignment: Physiologic. Vertebrae: No fracture, evidence of discitis, or bone  lesion. Cord: Normal signal and morphology. Posterior Fossa, vertebral arteries, paraspinal tissues: Negative. Disc levels: C1-2: Unremarkable. C2-3: Normal disc space and facet joints. There is no spinal canal stenosis. No neural foraminal stenosis. C3-4: Normal disc space and facet joints. There is no spinal canal stenosis. No neural foraminal stenosis. C4-5: Normal disc space and facet joints. There is no spinal canal stenosis. No neural foraminal stenosis. C5-6: Normal disc space and facet joints. There is no spinal canal stenosis. No neural foraminal stenosis. C6-7: Normal disc space and facet joints. There is no spinal canal stenosis. No neural foraminal stenosis. C7-T1: Normal disc space and facet joints. There is no spinal canal stenosis. No neural foraminal stenosis. IMPRESSION: Normal MRI of the cervical spine. Electronically Signed   By: Ulyses Jarred M.D.   On: 09/14/2022 19:55   MR BRAIN W WO CONTRAST  Result Date: 09/14/2022 CLINICAL DATA:  37 year old female code stroke presentation yesterday with persistent headache and nausea. EXAM: MRI HEAD WITHOUT AND WITH CONTRAST TECHNIQUE: Multiplanar, multiecho pulse sequences of the brain and surrounding structures were obtained without and with intravenous contrast. CONTRAST:  76mL GADAVIST GADOBUTROL 1 MMOL/ML IV SOLN COMPARISON:  CTV, CTA head yesterday.  Prior MRI 09/11/2022. FINDINGS: Brain: No restricted diffusion to suggest acute infarction. No midline shift, mass effect, evidence of mass lesion, ventriculomegaly, extra-axial collection or acute  intracranial hemorrhage. Cervicomedullary junction and pituitary are within normal limits. Pearline Cables and white matter signal appears stable throughout the brain compared to 09/11/2022, normal for age (minimal nonspecific white matter T2 and FLAIR hyperintensity. No encephalomalacia or chronic cerebral blood products identified. Normal basilar cisterns. No abnormal enhancement identified. No abnormal dural  thickening or enhancement at 3 Tesla imaging. Vascular: Major intracranial vascular flow voids are stable. Following contrast major dural venous sinuses remain patent and enhancing. Skull and upper cervical spine: Negative visible cervical spine. Hyperostosis of the calvarium, normal variant. Visualized bone marrow signal is within normal limits. Sinuses/Orbits: Stable, negative. Paranasal sinuses and mastoids are clear. Other: Visible internal auditory structures appear normal. Negative visible scalp and face. IMPRESSION: Stable and normal for age MRI appearance of the Brain. Electronically Signed   By: Genevie Ann M.D.   On: 09/14/2022 11:25   CT VENOGRAM HEAD  Result Date: 09/13/2022 CLINICAL DATA:  Headache.  Neuro deficit. EXAM: CT VENOGRAM HEAD TECHNIQUE: Venographic phase images of the brain were obtained following the administration of intravenous contrast. Multiplanar reformats and maximum intensity projections were generated. RADIATION DOSE REDUCTION: This exam was performed according to the departmental dose-optimization program which includes automated exposure control, adjustment of the mA and/or kV according to patient size and/or use of iterative reconstruction technique. CONTRAST:  14mL OMNIPAQUE IOHEXOL 350 MG/ML SOLN COMPARISON:  CTA head and neck 09/13/2022 and 09/11/2022. FINDINGS: Delayed images demonstrate no pathologic enhancement. The dural sinuses are patent. The straight sinus and deep cerebral veins are intact. Cortical veins are within normal limits. No significant vascular malformation is evident. The right transverse sinus is dominant. Cavernous sinus is within normal limits bilaterally. Superior ophthalmic veins are normal bilaterally. IMPRESSION: 1. No evidence of dural venous sinus thrombosis. 2. No evidence of vascular malformation. 3. No pathologic enhancement. Electronically Signed   By: San Morelle M.D.   On: 09/13/2022 19:14   CT ANGIO HEAD NECK W WO CM  Result  Date: 09/13/2022 CLINICAL DATA:  Persistent headaches. Nausea. Headaches are worsened with position following the lumbar puncture. EXAM: CT ANGIOGRAPHY HEAD AND NECK TECHNIQUE: Multidetector CT imaging of the head and neck was performed using the standard protocol during bolus administration of intravenous contrast. Multiplanar CT image reconstructions and MIPs were obtained to evaluate the vascular anatomy. Carotid stenosis measurements (when applicable) are obtained utilizing NASCET criteria, using the distal internal carotid diameter as the denominator. RADIATION DOSE REDUCTION: This exam was performed according to the departmental dose-optimization program which includes automated exposure control, adjustment of the mA and/or kV according to patient size and/or use of iterative reconstruction technique. CONTRAST:  99mL OMNIPAQUE IOHEXOL 350 MG/ML SOLN COMPARISON:  CTA head and neck 09/11/2022 FINDINGS: CTA NECK FINDINGS Aortic arch: A 3 vessel arch configuration is present. No significant atherosclerotic change or stenosis is present. Right carotid system: The right common carotid artery is within normal limits. Bifurcation is unremarkable. Cervical right ICA is normal. Left carotid system: The left common carotid artery is within normal limits. Bifurcation is unremarkable. Cervical left ICA is normal. Vertebral arteries: The left vertebral artery is dominant. Both vertebral arteries originate from the subclavian arteries without significant stenosis. No significant stenosis is present in either vertebral artery in the neck. Skeleton: Vertebral body heights and alignment are normal. No focal osseous lesions are present. Other neck: Soft tissues the neck are otherwise unremarkable. Salivary glands are within normal limits. Thyroid is normal. No significant adenopathy is present. No focal mucosal or submucosal lesions are present.  Upper chest: Mild dependent atelectasis is present in the lungs. Lungs are  otherwise clear. Review of the MIP images confirms the above findings CTA HEAD FINDINGS Anterior circulation: The internal carotid arteries are within normal limits through the ICA termini. Left A1 segment is dominant. Azygous A2 anatomy again noted. 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 Small branches arise from this point. No aneurysm is present. MCA bifurcations are within normal limits. ACA and MCA branch vessels are within normal limits bilaterally. Posterior circulation: The left vertebral artery is dominant vessel. PICA origins are visualized and normal. Vertebrobasilar junction basilar artery are normal. Both posterior cerebral arteries originate from basilar tip. PCA branch vessels are within normal limits bilaterally. Venous sinuses: The dural sinuses are patent. The straight sinus and deep cerebral veins are intact. Cortical veins are within normal limits. No significant vascular malformation is evident. Right transverse sinus is dominant. Review of the MIP images confirms the above findings IMPRESSION: 1. 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 No aneurysm is present. 2. Normal CTA of the neck. 3. Normal CTA circle-of-Willis without significant proximal stenosis, aneurysm, or branch vessel occlusion. Electronically Signed   By: San Morelle M.D.   On: 09/13/2022 18:58   CT HEAD CODE STROKE WO CONTRAST  Result Date: 09/13/2022 CLINICAL DATA:  Code stroke. Neuro deficit, acute, stroke suspected. EXAM: CT HEAD WITHOUT CONTRAST TECHNIQUE: Contiguous axial images were obtained from the base of the skull through the vertex without intravenous contrast. RADIATION DOSE REDUCTION: This exam was performed according to the departmental dose-optimization program which includes automated exposure control, adjustment of the mA and/or kV according to patient size and/or use of iterative reconstruction technique. COMPARISON:  CT head without  contrast, CTA head and neck and MR head without contrast 09/11/2022 FINDINGS: Brain: No acute infarct, hemorrhage, or mass lesion is present. No significant white matter lesions are present. Deep brain nuclei are within normal limits. The ventricles are of normal size. No significant extraaxial fluid collection is present. Vascular: No hyperdense vessel or unexpected calcification. Skull: Calvarium is intact. No focal lytic or blastic lesions are present. No significant extracranial soft tissue lesion is present. Sinuses/Orbits: The paranasal sinuses and mastoid air cells are clear. The globes and orbits are within normal limits. IMPRESSION: Negative CT of the head. Electronically Signed   By: San Morelle M.D.   On: 09/13/2022 16:18   MR BRAIN WO CONTRAST  Result Date: 09/11/2022 CLINICAL DATA:  Headache EXAM: MRI HEAD WITHOUT CONTRAST TECHNIQUE: Multiplanar, multiecho pulse sequences of the brain and surrounding structures were obtained without intravenous contrast. COMPARISON:  Head CT 09/10/2022 FINDINGS: Brain: No acute infarction, hemorrhage, hydrocephalus, extra-axial collection or mass lesion. No chronic microhemorrhage. Normal white matter signal. Normal CSF spaces. Midline structures are normal. Vascular: Normal flow voids. Skull and upper cervical spine: Normal marrow signal. Sinuses/Orbits: Paranasal sinuses are clear. No mastoid effusion. Normal orbits. Other: None. IMPRESSION: Normal MRI of the brain. Electronically Signed   By: Ulyses Jarred M.D.   On: 09/11/2022 19:26   DG FL GUIDED LUMBAR PUNCTURE  Result Date: 09/11/2022 CLINICAL DATA:  Patient presents to ED with complaints of severe headache. Request received for fluoroscopic guided lumbar puncture to rule out SAH. EXAM: DIAGNOSTIC LUMBAR PUNCTURE UNDER FLUOROSCOPIC GUIDANCE COMPARISON:  None Available. FLUOROSCOPY: Radiation Exposure Index (as provided by the fluoroscopic device): 19.50 mGy Kerma PROCEDURE: Informed consent was  obtained from the patient prior to the procedure, including potential  complications of headache, allergy, and pain. With the patient prone, the lower back was prepped with Betadine. 1% Lidocaine was used for local anesthesia. Lumbar puncture was initially attempted at L3-L4 with return of dark bloody fluid. The needle was removed. Puncture was then performed at the L4-L5 level using a 20 gauge needle with return of clear, blood-tinged CSF. Patient was rolled into the left lateral decubitus position. Opening pressure was 18 cm water. 6 ml of CSF were obtained for laboratory studies. The patient tolerated the procedure well and there were no apparent complications. Procedure was performed by Narda Rutherford, NP and supervised by Logan Bores, MD. IMPRESSION: Technically successful fluoroscopically guided diagnostic lumbar puncture. Read by: Narda Rutherford, AGNP-BC Electronically Signed   By: Logan Bores M.D.   On: 09/11/2022 14:04   CT ANGIO HEAD NECK W WO CM  Result Date: 09/11/2022 CLINICAL DATA:  37 year old female with worst headache of life, sudden onset. Blurred vision. EXAM: CT ANGIOGRAPHY HEAD AND NECK TECHNIQUE: Multidetector CT imaging of the head and neck was performed using the standard protocol during bolus administration of intravenous contrast. Multiplanar CT image reconstructions and MIPs were obtained to evaluate the vascular anatomy. Carotid stenosis measurements (when applicable) are obtained utilizing NASCET criteria, using the distal internal carotid diameter as the denominator. RADIATION DOSE REDUCTION: This exam was performed according to the departmental dose-optimization program which includes automated exposure control, adjustment of the mA and/or kV according to patient size and/or use of iterative reconstruction technique. CONTRAST:  77mL OMNIPAQUE IOHEXOL 350 MG/ML SOLN COMPARISON:  Plain head CT 0347 hours today. FINDINGS: CTA NECK Skeleton: Negative. Upper chest: Patchy bilateral dependent  pulmonary opacity and/or mosaic attenuation. Negative visible superior mediastinum, trachea. Other neck: Negative; no acute neck soft tissue finding. Aortic arch: 3 vessel arch configuration with no arch atherosclerosis. Right carotid system: Mildly tortuous brachiocephalic artery and proximal right CCA with no plaque or stenosis. Mild streak artifact related to right IJ venous contrast reflux. Negative right carotid bifurcation and cervical right ICA. Left carotid system: Mild tortuosity but otherwise negative. Vertebral arteries: Right subclavian venous contrast streak artifact but the proximal right subclavian artery and vertebral artery origin appear normal. Right vertebral artery appears patent and within normal limits to the skull base. Proximal left subclavian artery and left vertebral artery origin are normal. Dominant left vertebral artery is patent and normal to the skull base. CTA HEAD Posterior circulation: Right vertebral V4 segment is diminutive beyond the normal PICA origin. Left V4 is dominant. Left PICA origin is patent. No distal vertebral or vertebrobasilar junction stenosis. Patent basilar artery, AICA origins, SCA and PCA origins. Posterior communicating arteries are diminutive or absent. Bilateral PCA branches are within normal limits. Anterior circulation: Both ICA siphons are patent. No siphon plaque or stenosis. Ophthalmic artery origins appear normal. Patent carotid termini, MCA and ACA origins. Dominant left and diminutive right ACA A1 segments. Azygous ACA A2 anatomy. ACA branches are within normal limits. Left MCA M1 segment is within normal limits, diverticulum of the lenticulostriate vessels is suspected on series 11, image 15 (normal variant) this is about 2 mm. Left MCA bifurcation and branches are within normal limits. Right MCA M1 segment bifurcates early without stenosis. Right MCA branches are within normal limits. Venous sinuses: Patent. Right transverse and sigmoid venous  sinuses are dominant. Anatomic variants: Mildly dominant left vertebral artery, especially the V4 segment. Dominant left ACA A1 and azygous ACA A2 anatomy. Review of the MIP images confirms the above findings IMPRESSION: 1.  Essentially normal CTA Head and Neck; - no atherosclerosis or stenosis. - several vascular anatomic variations. Including a suspected Left MCA lenticulostriate artery infundibulum whereas alternatively a 2 mm Left M1 aneurysm is felt unlikely. But surveillance with Head MRA (noncontrast, no ionizing radiation) can ensue to document stability. 2. Patchy nonspecific upper lobe pulmonary opacity. Favor atelectasis over infection or edema. Electronically Signed   By: Genevie Ann M.D.   On: 09/11/2022 05:54   CT Head Wo Contrast  Result Date: 09/11/2022 CLINICAL DATA:  Headache EXAM: CT HEAD WITHOUT CONTRAST TECHNIQUE: Contiguous axial images were obtained from the base of the skull through the vertex without intravenous contrast. RADIATION DOSE REDUCTION: This exam was performed according to the departmental dose-optimization program which includes automated exposure control, adjustment of the mA and/or kV according to patient size and/or use of iterative reconstruction technique. COMPARISON:  None Available. FINDINGS: Brain: There is no mass, hemorrhage or extra-axial collection. The size and configuration of the ventricles and extra-axial CSF spaces are normal. The brain parenchyma is normal, without acute or chronic infarction. Vascular: No abnormal hyperdensity of the major intracranial arteries or dural venous sinuses. No intracranial atherosclerosis. Skull: The visualized skull base, calvarium and extracranial soft tissues are normal. Sinuses/Orbits: No fluid levels or advanced mucosal thickening of the visualized paranasal sinuses. No mastoid or middle ear effusion. The orbits are normal. IMPRESSION: Normal head CT. Electronically Signed   By: Ulyses Jarred M.D.   On: 09/11/2022 03:57       Subjective: Patient seen and examined at bedside today.  Hemodynamically stable.  Medically stable for discharge to home today.  Discussed discharge planning with husband at bedside  Discharge Exam: Vitals:   09/16/22 0327 09/16/22 0748  BP: (!) 142/89 (!) 128/93  Pulse: 65 73  Resp: 12 16  Temp: 97.8 F (36.6 C) 98.2 F (36.8 C)  SpO2: 100% 99%   Vitals:   09/15/22 2002 09/15/22 2330 09/16/22 0327 09/16/22 0748  BP: 134/84 (!) 121/92 (!) 142/89 (!) 128/93  Pulse: 83 74 65 73  Resp: 16 16 12 16   Temp: 98.6 F (37 C) 99 F (37.2 C) 97.8 F (36.6 C) 98.2 F (36.8 C)  TempSrc: Oral Oral Oral Oral  SpO2: 100% 99% 100% 99%  Weight:      Height:        General: Pt is alert, awake, not in acute distress Cardiovascular: RRR, S1/S2 +, no rubs, no gallops Respiratory: CTA bilaterally, no wheezing, no rhonchi Abdominal: Soft, NT, ND, bowel sounds + Extremities: no edema, no cyanosis    The results of significant diagnostics from this hospitalization (including imaging, microbiology, ancillary and laboratory) are listed below for reference.     Microbiology: No results found for this or any previous visit (from the past 240 hour(s)).   Labs: BNP (last 3 results) No results for input(s): "BNP" in the last 8760 hours. Basic Metabolic Panel: Recent Labs  Lab 09/11/22 0342 09/13/22 1604 09/13/22 1613 09/14/22 0500 09/15/22 0408 09/16/22 0339  NA 137 135 138 133* 135 133*  K 3.6 3.4* 3.4* 3.6 3.3* 3.2*  CL 101 104 103 102 105 104  CO2 24 21*  --  19* 20* 20*  GLUCOSE 144* 168* 167* 127* 122* 120*  BUN 11 8 8 8 6 6   CREATININE 0.50 0.70 0.50 0.66 0.62 0.60  CALCIUM 9.3 8.6*  --  8.2* 8.2* 8.3*  MG  --   --   --  1.8  --   --  Liver Function Tests: Recent Labs  Lab 09/13/22 1604  AST 35  ALT 83*  ALKPHOS 84  BILITOT 0.5  PROT 7.4  ALBUMIN 3.1*   No results for input(s): "LIPASE", "AMYLASE" in the last 168 hours. No results for input(s): "AMMONIA"  in the last 168 hours. CBC: Recent Labs  Lab 09/11/22 0342 09/13/22 1604 09/13/22 1613 09/14/22 0500  WBC 8.1 8.4  --  9.1  NEUTROABS 4.3 5.4  --  7.1  HGB 16.0* 15.7* 15.3* 14.3  HCT 48.4* 45.4 45.0 41.8  MCV 83.6 83.3  --  82.8  PLT 376 348  --  319   Cardiac Enzymes: No results for input(s): "CKTOTAL", "CKMB", "CKMBINDEX", "TROPONINI" in the last 168 hours. BNP: Invalid input(s): "POCBNP" CBG: Recent Labs  Lab 09/15/22 0638 09/15/22 1148 09/15/22 1816 09/16/22 0001 09/16/22 0625  GLUCAP 152* 96 99 124* 146*   D-Dimer No results for input(s): "DDIMER" in the last 72 hours. Hgb A1c Recent Labs    09/14/22 0500  HGBA1C 6.6*   Lipid Profile No results for input(s): "CHOL", "HDL", "LDLCALC", "TRIG", "CHOLHDL", "LDLDIRECT" in the last 72 hours. Thyroid function studies No results for input(s): "TSH", "T4TOTAL", "T3FREE", "THYROIDAB" in the last 72 hours.  Invalid input(s): "FREET3" Anemia work up No results for input(s): "VITAMINB12", "FOLATE", "FERRITIN", "TIBC", "IRON", "RETICCTPCT" in the last 72 hours. Urinalysis    Component Value Date/Time   COLORURINE YELLOW 03/31/2011 1230   APPEARANCEUR CLEAR 03/31/2011 1230   LABSPEC 1.015 03/31/2011 1230   PHURINE 6.5 03/31/2011 1230   GLUCOSEU NEGATIVE 03/31/2011 1230   HGBUR LARGE (A) 03/31/2011 1230   BILIRUBINUR NEGATIVE 03/31/2011 1230   KETONESUR NEGATIVE 03/31/2011 1230   PROTEINUR 30 (A) 03/31/2011 1230   UROBILINOGEN 0.2 03/31/2011 1230   NITRITE NEGATIVE 03/31/2011 1230   LEUKOCYTESUR NEGATIVE 03/31/2011 1230   Sepsis Labs Recent Labs  Lab 09/11/22 0342 09/13/22 1604 09/14/22 0500  WBC 8.1 8.4 9.1   Microbiology No results found for this or any previous visit (from the past 240 hour(s)).  Please note: You were cared for by a hospitalist during your hospital stay. Once you are discharged, your primary care physician will handle any further medical issues. Please note that NO REFILLS for any  discharge medications will be authorized once you are discharged, as it is imperative that you return to your primary care physician (or establish a relationship with a primary care physician if you do not have one) for your post hospital discharge needs so that they can reassess your need for medications and monitor your lab values.    Time coordinating discharge: 40 minutes  SIGNED:   Shelly Coss, MD  [facility] 09/16/2022, 10:02 AM Pager [phone]  If 7PM-7AM, please contact night-coverage [email or web address] Password [email or web address]

## 2022-09-16 NOTE — Progress Notes (Signed)
Neurology Progress Note  Brief HPI: Kelly Gregory is a 37 y.o. female with a past medical history of DM and HTN presenting with headache and nausea.  She was initially seen 12/29 for headache.  CT, CTA, and MRI were unremarkable.  An LP was done under fluoroscopy guidance as well that was bloody with RBCs clearing on subsequent tubes-not suggestive of subarachnoid hemorrhage.  She reports the headaches have been constant since then.  They have somewhat worsened after the LP, only worsened by position but otherwise have remained of the same quality.   Denies history of autoimmune symptoms, transient episodes of weakness, or vision loss.   On further detailed history, she reports that her initial headache started when she was urinating, not straining at all and not defecating but just sitting on the toilet and peeing comfortably.  It was so severe that she had to lay down on the floor of the bathroom until it abated enough that her husband was able to assist her back into bed.  Subsequently it improved while she was laying in bed she was able to eat some chocolates while laying down and felt better enough to engage in intimate activity.  However when she sat up to straddle her husband she again had a severe headache with nausea and vomiting.  No other bowel or bladder symptoms other than she has not had a bowel movement since Thursday (normally voids daily but also has not been eating and drinking much due to headache nausea and vomiting)  Subjective: No headache laying flat or at 30 deg when elevated; at 80 deg has 3/10 headache Improving tachycardia, BP and anxiety with reducing dose to 250 mg TID; skipped 4 AM dose  BM yesterday at 6 PM without straining, on toilet, 5/10 HA with that activity (tolerable)  Reports did go to [employer] prior to HA onset but only rode gentle rides with 72 year old child, no sudden movements still no clear trigger identified  No new symptoms  Exam: Current vital  signs: BP (!) 142/89 (BP Location: Left Arm)   Pulse 65   Temp 97.8 F (36.6 C) (Oral)   Resp 12   Ht 4\' 9"  (1.448 m)   Wt 74.6 kg   LMP 09/10/2022   SpO2 100%   BMI 35.59 kg/m  Vital signs in last 24 hours: Temp:  [97.7 F (36.5 C)-99 F (37.2 C)] 97.8 F (36.6 C) (01/03 0327) Pulse Rate:  [55-84] 65 (01/03 0327) Resp:  [12-17] 12 (01/03 0327) BP: (121-142)/(77-113) 142/89 (01/03 0327) SpO2:  [99 %-100 %] 100 % (01/03 0327)   Gen: In bed, NAD Resp: non-labored breathing, no acute distress Abd: soft, nt Neurological exam Awake alert oriented not in distress while laying flat in bed, fluent speech No dysarthria No aphasia Face symmetric, hearing intact to voice, denies double vision, orients and tracks examiner Using bilateral upper extremities equally   Pertinent Labs: Na - 133 Mag - pending  Imaging Reviewed:  CT Head Negative CT of the head.   CTA Head and Neck 1. 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 No aneurysm is present. 2. Normal CTA of the neck. 3. Normal CTA circle-of-Willis without significant proximal stenosis, aneurysm, or branch vessel occlusion.  CT Venogram 1. No evidence of dural venous sinus thrombosis. 2. No evidence of vascular malformation. 3. No pathologic enhancement.  12/29 MRI Brain Normal MRI of the brain   MRI brain with and without contrast 09/14/2022 10:25 AM Normal  MRI C-spine personally reviewed, agree with radiology:   Reassuring  Assessment:  Continue to suspect spontaneous CSF leak as the initial etiology but currently improving with supportive care. Discussed with radiology, outpatient CT myelogram / blood patch might be preferable due to equipment differences, if patient symptoms allow. Will reach out to [place] for appointment.  Continue to slowly taper caffeine and continue fluid intake, can be done orally at this time.   Impression:  Concern for low pressure headache   Neck  pain Component of left-sided occipital neuralgia  Recommendations:  Concern for spontaneous CSF leak headache, improving with supportive care - Caffiene as tolerated orally by patient, counseled she has been getting target dose of 750 mg in the last 24 hours, should taper gradually at least 500 mg today, 250 tomorrow, 125 the next day, 65 the day after, can try to front load dose in the morning - Continue aggressive oral fluid intake as tolerated - Avoid straining strictly, continue to rest flat as much as possible and slowly increase activity - Close outpatient followup with [facility], referral placed - Outpatient appointment with [facility] [place] for CT myelogram and possible blood patch, referral placed   Neck pain - MRI cervical spine without contrast completed, negative  Left-sided occipital neuralgia, improving -Ice packs as tolerated -If resistant outpatient consider occipital nerve block  Stable for discharge today from neurological perspective, we will be available as needed going forward. Discussed with husband at bedside and with primary team via secure chat  Lesleigh Noe MD-PhD [facility] [phone] Available 7 AM to 7 PM, outside these hours please contact Neurologist on call listed on [place] than 50 min spent in care of this patient, majority at bedside

## 2022-09-16 NOTE — Progress Notes (Signed)
Orders placed for Myelogram -- blood patch to be performed if leak identified

## 2022-09-16 NOTE — Plan of Care (Signed)
POC completed. Patient discharged today. All belongings were returned. PIV removed. Left in stable condition accompanied by husband.    Problem: Education: Goal: Ability to describe self-care measures that may prevent or decrease complications (Diabetes Survival Skills Education) will improve Outcome: Completed/Met Goal: Individualized Educational Video(s) Outcome: Completed/Met   Problem: Coping: Goal: Ability to adjust to condition or change in health will improve Outcome: Completed/Met   Problem: Fluid Volume: Goal: Ability to maintain a balanced intake and output will improve Outcome: Completed/Met   Problem: Health Behavior/Discharge Planning: Goal: Ability to identify and utilize available resources and services will improve Outcome: Completed/Met Goal: Ability to manage health-related needs will improve Outcome: Completed/Met   Problem: Metabolic: Goal: Ability to maintain appropriate glucose levels will improve Outcome: Completed/Met   Problem: Nutritional: Goal: Maintenance of adequate nutrition will improve Outcome: Completed/Met Goal: Progress toward achieving an optimal weight will improve Outcome: Completed/Met   Problem: Skin Integrity: Goal: Risk for impaired skin integrity will decrease Outcome: Completed/Met   Problem: Tissue Perfusion: Goal: Adequacy of tissue perfusion will improve Outcome: Completed/Met   Problem: Education: Goal: Knowledge of General Education information will improve Description: Including pain rating scale, medication(s)/side effects and non-pharmacologic comfort measures Outcome: Completed/Met   Problem: Health Behavior/Discharge Planning: Goal: Ability to manage health-related needs will improve Outcome: Completed/Met   Problem: Clinical Measurements: Goal: Ability to maintain clinical measurements within normal limits will improve Outcome: Completed/Met Goal: Will remain free from infection Outcome: Completed/Met Goal:  Diagnostic test results will improve Outcome: Completed/Met Goal: Respiratory complications will improve Outcome: Completed/Met Goal: Cardiovascular complication will be avoided Outcome: Completed/Met   Problem: Activity: Goal: Risk for activity intolerance will decrease Outcome: Completed/Met   Problem: Nutrition: Goal: Adequate nutrition will be maintained Outcome: Completed/Met   Problem: Coping: Goal: Level of anxiety will decrease Outcome: Completed/Met   Problem: Elimination: Goal: Will not experience complications related to bowel motility Outcome: Completed/Met Goal: Will not experience complications related to urinary retention Outcome: Completed/Met   Problem: Pain Managment: Goal: General experience of comfort will improve Outcome: Completed/Met   Problem: Safety: Goal: Ability to remain free from injury will improve Outcome: Completed/Met   Problem: Skin Integrity: Goal: Risk for impaired skin integrity will decrease Outcome: Completed/Met   

## 2022-09-17 ENCOUNTER — Encounter: Payer: Self-pay | Admitting: Psychiatry

## 2022-09-17 ENCOUNTER — Ambulatory Visit: Payer: 59 | Admitting: Psychiatry

## 2022-09-17 VITALS — BP 145/105 | HR 83 | Ht <= 58 in | Wt 159.0 lb

## 2022-09-17 DIAGNOSIS — G9600 Cerebrospinal fluid leak, unspecified: Secondary | ICD-10-CM | POA: Diagnosis not present

## 2022-09-17 MED ORDER — PROCHLORPERAZINE MALEATE 10 MG PO TABS: 10.0000 mg | ORAL_TABLET | Freq: Four times a day (QID) | ORAL | 0 refills | Status: DC | PRN

## 2022-09-17 MED ORDER — DICLOFENAC POTASSIUM 50 MG PO TABS: 50.0000 mg | ORAL_TABLET | Freq: Three times a day (TID) | ORAL | 0 refills | Status: AC

## 2022-09-17 NOTE — Progress Notes (Signed)
[facility]  PATIENT: Kelly Gregory DOB: 08/15/1986  REFERRING CLINICIAN: Pleas Koch, NP HISTORY FROM: self, spouse Marcello Moores REASON FOR VISIT: headaches   HISTORICAL  CHIEF COMPLAINT:  Chief Complaint  Patient presents with   Follow-up    Rm 1 with spouse Marcello Moores  Pt is well, has been having L sided headaches/jaw/neck pain and nausea    HISTORY OF PRESENT ILLNESS:  The patient presents for evaluation of headache which began 09/11/22. Around 9 pm she was urinating when she developed sharp pains her head. This pain was severe for 1 minute, then dulled down. Pain did not fully resolve. She went to bed, then around midnight she developed severe sharp pains a second time. This time the pain remained severe and she started to vomit. She did not have any photophobia or phonophobia. Headache improved when she was lying flat, and was worse with sitting up or standing. Her neck felt stiff and it was painful to move her head. She presented to the ED for her headaches and systolic BP was in the 774J per EMS. MRI brain, CTV, and MRI C-spine were unremarkable. CTA head/neck showed a 71mm infundibulum and was otherwise normal. She underwent LP which was consistent with a traumatic tap. There was concern for CSF leak and she was treated with IV caffeine. This helped her headache but caused palpitations and jitteriness. She is scheduled for a myelogram tomorrow.  Since her ED visit her headache has improved but has not fully resolved. Currently she has a 4/10 headache. Continues to have neck stiffness and pain L>R. She is currently taking caffeine pills and Tylenol which help somewhat.  OTHER MEDICAL CONDITIONS: HLD, HTN, DM, Rheumatoid arthritis   REVIEW OF SYSTEMS: Full 14 system review of systems performed and negative with exception of: headaches  ALLERGIES: Allergies  Allergen Reactions   Azithromycin Nausea And Vomiting   Gatifloxacin Nausea And Vomiting   Tequin  Nausea And Vomiting   Penicillins Hives, Rash and Other (See Comments)    Has patient had a PCN reaction causing immediate rash, facial/tongue/throat swelling, SOB or lightheadedness with hypotension: Unknown Has patient had a PCN reaction causing severe rash involving mucus membranes or skin necrosis: Unknown Has patient had a PCN reaction that required hospitalization: Unknown Has patient had a PCN reaction occurring within the last 10 years: No If all of the above answers are "NO", then may proceed with Cephalosporin use.     HOME MEDICATIONS: Outpatient Medications Prior to Visit  Medication Sig Dispense Refill   caffeine 200 MG TABS tablet Take 1 tablet (200 mg total) by mouth once for 1 dose. 1 tablet 0   [START ON 09/18/2022] caffeine 200 MG TABS tablet Take 0.5 tablets (100 mg total) by mouth once for 1 dose. 0.5 tablet 0   ENBREL SURECLICK 50 MG/ML injection Inject 50 mg into the skin once a week.     metFORMIN (GLUCOPHAGE-XR) 500 MG 24 hr tablet TAKE 1 TABLET BY MOUTH DAILY WITH BREAKFAST FOR DIABETES (Patient taking differently: Take 500 mg by mouth daily with breakfast.) 90 tablet 0   NIFEdipine (PROCARDIA-XL/NIFEDICAL-XL) 30 MG 24 hr tablet TAKE 1 TABLET BY MOUTH EVERY DAY FOR BLOOD PRESSURE (Patient taking differently: Take 30 mg by mouth daily.) 90 tablet 2   potassium chloride SA (KLOR-CON M) 20 MEQ tablet Take 2 tablets (40 mEq total) by mouth daily for 3 days. 6 tablet 0   pravastatin (PRAVACHOL) 40 MG tablet TAKE 1 TABLET BY MOUTH  EVERY DAY FOR CHOLESTEROL (Patient not taking: Reported on 09/13/2022) 90 tablet 0   No facility-administered medications prior to visit.    PAST MEDICAL HISTORY: Past Medical History:  Diagnosis Date   Borderline diabetes    Cholelithiasis with acute cholecystitis 08/21/2019   DM (diabetes mellitus) (HCC)    Elevated blood pressure    no meds   Gestational diabetes    Hypertension    Rheumatoid arthritis (HCC)    Years ago, does not  follow, no meds   S/P repeat low transverse C-section 03/27/2019   Seasonal allergies    Syncope    As a child, no problems during adulthood.    PAST SURGICAL HISTORY: Past Surgical History:  Procedure Laterality Date   CESAREAN SECTION  04/01/2011   Procedure: CESAREAN SECTION;  Surgeon: Oliver Pila;  Location: [facility];  Service: Gynecology;  Laterality: N/A;   CESAREAN SECTION N/A 05/23/2013   Procedure: CESAREAN SECTION;  Surgeon: Lavina Hamman, MD;  Location: [facility];  Service: Obstetrics;  Laterality: N/A;   CESAREAN SECTION N/A 03/27/2019   Procedure: CESAREAN SECTION;  Surgeon: Huel Cote, MD;  Location: [facility];  Service: Obstetrics;  Laterality: N/AHerbert Seta,  RNFA   CHOLECYSTECTOMY N/A 08/21/2019   Procedure: LAPAROSCOPIC CHOLECYSTECTOMY with IOC;  Surgeon: Manus Rudd, MD;  Location: [facility];  Service: General;  Laterality: N/A;   CYSTECTOMY Left    removed from underarm    DILATION AND EVACUATION N/A 02/01/2018   Procedure: DILATATION AND EVACUATION;  Surgeon: Huel Cote, MD;  Location: [facility];  Service: Gynecology;  Laterality: N/A;   TOOTH EXTRACTION     wisdom teeth ext    FAMILY HISTORY: Family History  Adopted: Yes  Problem Relation Age of Onset   Other Other        Adopted    SOCIAL HISTORY: Social History   Socioeconomic History   Marital status: Married    Spouse name: Not on file   Number of children: Not on file   Years of education: Not on file   Highest education level: Not on file  Occupational History   Not on file  Tobacco Use   Smoking status: Never   Smokeless tobacco: Never  Vaping Use   Vaping Use: Never used  Substance and Sexual Activity   Alcohol use: No   Drug use: No   Sexual activity: Yes    Comment: approx [redacted] wks gestation  Other Topics Concern   Not on file  Social History Narrative   Married.   2 children.   Works as a Paramedic.   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   Social  Determinants of Health   Financial Resource Strain: Low Risk  (03/16/2019)   Overall Financial Resource Strain (CARDIA)    Difficulty of Paying Living Expenses: Not hard at all  Food Insecurity: No Food Insecurity (09/14/2022)   Hunger Vital Sign    Worried About Running Out of Food in the Last Year: Never true    Ran Out of Food in the Last Year: Never true  Transportation Needs: No Transportation Needs (09/14/2022)   PRAPARE - Administrator, Civil Service (Medical): No    Lack of Transportation (Non-Medical): No  Physical Activity: Not on file  Stress: No Stress Concern Present (03/16/2019)   [employer] of Occupational Health - Occupational Stress Questionnaire    Feeling of Stress : Not at all  Social Connections: Not on file  Intimate Partner  Violence: Not At Risk (09/14/2022)   Humiliation, Afraid, Rape, and Kick questionnaire    Fear of Current or Ex-Partner: No    Emotionally Abused: No    Physically Abused: No    Sexually Abused: No     PHYSICAL EXAM  GENERAL EXAM/CONSTITUTIONAL: Vitals:  Vitals:   09/17/22 1247  BP: (!) 145/105  Pulse: 83  Weight: 159 lb (72.1 kg)  Height: 4\' 9"  (1.448 m)   Body mass index is 34.41 kg/m. Wt Readings from Last 3 Encounters:  09/17/22 159 lb (72.1 kg)  09/13/22 164 lb 7.4 oz (74.6 kg)  06/04/22 167 lb (75.8 kg)    NEUROLOGIC: MENTAL STATUS:  awake, alert, oriented to person, place and time recent and remote memory intact normal attention and concentration language fluent, comprehension intact fund of knowledge appropriate  CRANIAL NERVE:  2nd, 3rd, 4th, 6th - pupils equal and reactive to light, visual fields full to confrontation, extraocular muscles intact, no nystagmus 5th - facial sensation symmetric 7th - facial strength symmetric 8th - hearing intact 9th - palate elevates symmetrically, uvula midline 11th - shoulder shrug symmetric 12th - tongue protrusion midline  MOTOR:  normal bulk and tone, full  strength in the BUE, BLE  SENSORY:  normal and symmetric to light touch all 4 extremities  COORDINATION:  finger-nose-finger intact bilaterally  REFLEXES:  deep tendon reflexes present and symmetric  GAIT/STATION:  normal     DIAGNOSTIC DATA (LABS, IMAGING, TESTING) - I reviewed patient records, labs, notes, testing and imaging myself where available.  Lab Results  Component Value Date   WBC 9.1 09/14/2022   HGB 14.3 09/14/2022   HCT 41.8 09/14/2022   MCV 82.8 09/14/2022   PLT 319 09/14/2022      Component Value Date/Time   NA 133 (L) 09/16/2022 0339   K 3.2 (L) 09/16/2022 0339   CL 104 09/16/2022 0339   CO2 20 (L) 09/16/2022 0339   GLUCOSE 120 (H) 09/16/2022 0339   BUN 6 09/16/2022 0339   CREATININE 0.60 09/16/2022 0339   CREATININE 0.69 07/14/2019 1435   CALCIUM 8.3 (L) 09/16/2022 0339   PROT 7.4 09/13/2022 1604   ALBUMIN 3.1 (L) 09/13/2022 1604   AST 35 09/13/2022 1604   ALT 83 (H) 09/13/2022 1604   ALKPHOS 84 09/13/2022 1604   BILITOT 0.5 09/13/2022 1604   GFRNONAA >60 09/16/2022 0339   GFRAA >60 08/21/2019 0226   Lab Results  Component Value Date   CHOL 253 (H) 06/04/2022   HDL 41.80 06/04/2022   LDLCALC 154 (H) 11/08/2020   LDLDIRECT 184.0 06/04/2022   TRIG 241.0 (H) 06/04/2022   CHOLHDL 6 06/04/2022   Lab Results  Component Value Date   HGBA1C 6.6 (H) 09/14/2022   Lab Results  Component Value Date   VITAMINB12 892 06/04/2022   Lab Results  Component Value Date   TSH 1.28 06/04/2022    ASSESSMENT AND PLAN  37 y.o. year old female with a history of HLD, HTN, DM, Rheumatoid arthritis who presents for evaluation of new onset headaches and neck pain. Neurologic workup including MRI brain, C-spine, CTV, CTA head/neck, and LP were unrevealing. Agree that highly positional headache is concerning for CSF leak. She is scheduled for myelogram tomorrow, with plan for blood patch if there is evidence of CSF leak. In the meantime will provide compazine  for nausea and diclofenac to take as needed for headaches.   1. CSF leak       PLAN: -Myelogram scheduled for tomorrow,  consider blood patch pending these results -Start compazine 10 mg PRN for nausea -Diclofenac 50 mg TID x5 days for headache  Meds ordered this encounter  Medications   prochlorperazine (COMPAZINE) 10 MG tablet    Sig: Take 1 tablet (10 mg total) by mouth every 6 (six) hours as needed for nausea or vomiting.    Dispense:  30 tablet    Refill:  0   diclofenac (CATAFLAM) 50 MG tablet    Sig: Take 1 tablet (50 mg total) by mouth 3 (three) times daily for 5 days. Take 1-2 tablets    Dispense:  15 tablet    Refill:  0    Return if symptoms worsen or fail to improve.    Genia Harold, MD 09/17/22 1:26 PM  I spent an average of 41 chart reviewing and counseling the patient, with at least 50% of the time face to face with the patient.   [facility] [address] [place], [place] [address] [phone]

## 2022-09-17 NOTE — Patient Instructions (Addendum)
Take compazine every 6 hours as needed for nausea Take diclofenac up to 3 times a day for headaches

## 2022-09-17 NOTE — Discharge Instructions (Signed)

## 2022-09-18 ENCOUNTER — Ambulatory Visit
Admission: RE | Admit: 2022-09-18 | Discharge: 2022-09-18 | Disposition: A | Discharge status: Home / Self Care | Payer: 59 | Source: Ambulatory Visit | Attending: Neurology | Admitting: Neurology

## 2022-09-18 ENCOUNTER — Other Ambulatory Visit: Payer: Self-pay | Admitting: Neurology

## 2022-09-18 DIAGNOSIS — G9600 Cerebrospinal fluid leak, unspecified: Secondary | ICD-10-CM

## 2022-09-18 MED ORDER — MEPERIDINE HCL 50 MG/ML IJ SOLN: 50.0000 mg | Freq: Once | INTRAMUSCULAR | Status: DC | PRN

## 2022-09-18 MED ORDER — ONDANSETRON HCL 4 MG/2ML IJ SOLN: 4.0000 mg | Freq: Once | INTRAMUSCULAR | Status: DC | PRN

## 2022-09-18 MED ORDER — IOPAMIDOL (ISOVUE-M 200) INJECTION 41%
1.0000 mL | Freq: Once | INTRAMUSCULAR | Status: AC
  Administered 2022-09-18: 1 mL via EPIDURAL

## 2022-09-18 MED ORDER — IOPAMIDOL (ISOVUE-M 300) INJECTION 61%
10.0000 mL | Freq: Once | INTRAMUSCULAR | Status: AC
  Administered 2022-09-18: 10 mL via INTRATHECAL

## 2022-09-18 MED ORDER — DIAZEPAM 5 MG PO TABS
10.0000 mg | ORAL_TABLET | Freq: Once | ORAL | Status: AC
  Administered 2022-09-18: 5 mg via ORAL

## 2022-09-18 NOTE — Progress Notes (Deleted)
MD Derry recommended to proceed with blood patch post total myelogram, consent obtained.    15cc blood collected from pts LAC prior to blood patch procedure. Pt tolerated well. 1 successful attempt. Gauze and tape applied after.

## 2022-09-18 NOTE — Discharge Instr - Other Info (Addendum)
MD Derry reccommended blood patch after total myelogram completed. Consent obtained.   15cc blood collected from pts LAC prior to blood patch procedure. Pt tolerated well. 1 successful attempt. Gauze and tape applied after.

## 2022-09-18 NOTE — Progress Notes (Signed)
15cc blood collected from pts LAC prior to blood patch procedure. Pt tolerated well. 1 successful attempt. Gauze and tape applied after.

## 2022-09-18 NOTE — Discharge Instructions (Signed)

## 2022-09-23 ENCOUNTER — Encounter (INDEPENDENT_AMBULATORY_CARE_PROVIDER_SITE_OTHER): Payer: 59 | Admitting: Psychiatry

## 2022-09-23 DIAGNOSIS — G9600 Cerebrospinal fluid leak, unspecified: Secondary | ICD-10-CM

## 2022-09-24 MED ORDER — METHOCARBAMOL 500 MG PO TABS: 500.0000 mg | ORAL_TABLET | Freq: Three times a day (TID) | ORAL | 0 refills | Status: DC | PRN

## 2022-09-24 NOTE — Telephone Encounter (Signed)

## 2022-10-11 ENCOUNTER — Other Ambulatory Visit: Payer: Self-pay | Admitting: Psychiatry

## 2022-10-25 ENCOUNTER — Other Ambulatory Visit: Payer: Self-pay | Admitting: Primary Care

## 2022-10-25 DIAGNOSIS — I10 Essential (primary) hypertension: Secondary | ICD-10-CM

## 2022-10-25 DIAGNOSIS — E119 Type 2 diabetes mellitus without complications: Secondary | ICD-10-CM

## 2022-10-25 NOTE — Telephone Encounter (Signed)
Please call patient:  Due for diabetes follow up, but I see that her A1C was checked in January during her hospital visit. Slightly higher than usual at 6.6.  Would like to see her in the office for diabetes follow up in late March/early April.

## 2022-10-26 NOTE — Telephone Encounter (Signed)
Unable to reach patient. Left voicemail to return call to our office.   

## 2022-10-27 NOTE — Telephone Encounter (Signed)
Unable to reach patient. Left voicemail to return call to our office.   

## 2022-10-29 NOTE — Telephone Encounter (Signed)
Unable to reach patient. Left voicemail to return call to office to schedule diabetes follow up in late March/early April.

## 2023-01-21 ENCOUNTER — Encounter: Payer: Self-pay | Admitting: Primary Care

## 2023-01-21 ENCOUNTER — Ambulatory Visit (INDEPENDENT_AMBULATORY_CARE_PROVIDER_SITE_OTHER): Payer: 59 | Admitting: Primary Care

## 2023-01-21 VITALS — BP 132/84 | HR 84 | Temp 97.9°F | Ht <= 58 in | Wt 174.0 lb

## 2023-01-21 DIAGNOSIS — Z0000 Encounter for general adult medical examination without abnormal findings: Secondary | ICD-10-CM | POA: Diagnosis not present

## 2023-01-21 DIAGNOSIS — E119 Type 2 diabetes mellitus without complications: Secondary | ICD-10-CM

## 2023-01-21 DIAGNOSIS — E785 Hyperlipidemia, unspecified: Secondary | ICD-10-CM

## 2023-01-21 DIAGNOSIS — G43909 Migraine, unspecified, not intractable, without status migrainosus: Secondary | ICD-10-CM | POA: Diagnosis not present

## 2023-01-21 DIAGNOSIS — M069 Rheumatoid arthritis, unspecified: Secondary | ICD-10-CM | POA: Diagnosis not present

## 2023-01-21 DIAGNOSIS — I10 Essential (primary) hypertension: Secondary | ICD-10-CM

## 2023-01-21 DIAGNOSIS — E1165 Type 2 diabetes mellitus with hyperglycemia: Secondary | ICD-10-CM

## 2023-01-21 DIAGNOSIS — Z7984 Long term (current) use of oral hypoglycemic drugs: Secondary | ICD-10-CM

## 2023-01-21 LAB — LIPID PANEL
Cholesterol: 231 mg/dL — ABNORMAL HIGH (ref 0–200)
HDL: 42.1 mg/dL (ref >39.00)
LDL Cholesterol: 160 mg/dL — ABNORMAL HIGH (ref 0–99)
NonHDL: 188.88
Total CHOL/HDL Ratio: 5
Triglycerides: 144 mg/dL (ref 0.0–149.0)
VLDL: 28.8 mg/dL (ref 0.0–40.0)

## 2023-01-21 LAB — COMPREHENSIVE METABOLIC PANEL
ALT: 132 U/L — ABNORMAL HIGH (ref 0–35)
AST: 53 U/L — ABNORMAL HIGH (ref 0–37)
Albumin: 3.3 g/dL — ABNORMAL LOW (ref 3.5–5.2)
Alkaline Phosphatase: 73 U/L (ref 39–117)
BUN: 13 mg/dL (ref 6–23)
CO2: 23 mEq/L (ref 19–32)
Calcium: 8.5 mg/dL (ref 8.4–10.5)
Chloride: 103 mEq/L (ref 96–112)
Creatinine, Ser: 0.63 mg/dL (ref 0.40–1.20)
GFR: 112.07 mL/min (ref >60.00)
Glucose, Bld: 139 mg/dL — ABNORMAL HIGH (ref 70–99)
Potassium: 3.7 mEq/L (ref 3.5–5.1)
Sodium: 134 mEq/L — ABNORMAL LOW (ref 135–145)
Total Bilirubin: 0.4 mg/dL (ref 0.2–1.2)
Total Protein: 7 g/dL (ref 6.0–8.3)

## 2023-01-21 LAB — HEMOGLOBIN A1C: Hgb A1c MFr Bld: 6.9 % — ABNORMAL HIGH (ref 4.6–6.5)

## 2023-01-21 LAB — MICROALBUMIN / CREATININE URINE RATIO
Creatinine,U: 18.8 mg/dL
Microalb Creat Ratio: 3.7 mg/g (ref 0.0–30.0)
Microalb, Ur: <0.7 mg/dL (ref 0.0–1.9)

## 2023-01-21 MED ORDER — METFORMIN HCL ER 500 MG PO TB24: ORAL_TABLET | ORAL | 1 refills | Status: DC

## 2023-01-21 NOTE — Assessment & Plan Note (Signed)
Repeat lipid panel pending.  Discussed the importance of a healthy diet and regular exercise in order for weight loss, and to reduce the risk of further co-morbidity.  

## 2023-01-21 NOTE — Assessment & Plan Note (Signed)
Immunizations UTD. Pap smear UTD  Discussed the importance of a healthy diet and regular exercise in order for weight loss, and to reduce the risk of further co-morbidity.  Exam stable. Labs pending.  Follow up in 1 year for repeat physical.  

## 2023-01-21 NOTE — Assessment & Plan Note (Signed)
Controlled.  Continue Enbrel 50 mg weekly. Continue prednisone PRN. Following with rheumatology.

## 2023-01-21 NOTE — Progress Notes (Signed)
Subjective:    Patient ID: Kelly Gregory, female    DOB: August 15, 1986, 37 y.o.   MRN: [id number]  HPI  Kelly Gregory is a very pleasant 37 y.o. female who presents today for complete physical and follow up of chronic conditions.  Immunizations: -Tetanus: Completed in 2020 -Influenza: Completed this season -Pneumonia: Completed in 2017  Diet: Fair diet.  Exercise: No regular exercise.  Eye exam: Completes annually  Dental exam: Completes semi-annually   Pap Smear: UTD, follows with GYN  BP Readings from Last 3 Encounters:  01/21/23 132/84  09/18/22 130/77  09/17/22 (!) 145/105       Review of Systems  Constitutional:  Negative for unexpected weight change.  HENT:  Negative for rhinorrhea.   Respiratory:  Negative for cough and shortness of breath.   Cardiovascular:  Negative for chest pain.  Gastrointestinal:  Negative for constipation and diarrhea.  Genitourinary:  Negative for difficulty urinating.  Musculoskeletal:  Negative for arthralgias and myalgias.  Skin:  Negative for rash.  Allergic/Immunologic: Negative for environmental allergies.  Neurological:  Negative for dizziness, numbness and headaches.  Psychiatric/Behavioral:  The patient is not nervous/anxious.          Past Medical History:  Diagnosis Date   Abnormal uterine bleeding 06/04/2022   Borderline diabetes    Cholelithiasis with acute cholecystitis 08/21/2019   Closed traumatic nondisplaced fracture of distal end of right fibula 11/26/2021   CSF leak 09/16/2022   DM (diabetes mellitus) (HCC)    Elevated blood pressure    no meds   Gestational diabetes    Hypertension    Mixed stress and urge urinary incontinence 12/06/2019   Occipital neuralgia of left side 09/16/2022   Rheumatoid arthritis (HCC)    Years ago, does not follow, no meds   S/P repeat low transverse C-section 03/27/2019   Seasonal allergies    Syncope    As a child, no problems during adulthood.    Social History    Socioeconomic History   Marital status: Married    Spouse name: Not on file   Number of children: Not on file   Years of education: Not on file   Highest education level: Not on file  Occupational History   Not on file  Tobacco Use   Smoking status: Never   Smokeless tobacco: Never  Vaping Use   Vaping Use: Never used  Substance and Sexual Activity   Alcohol use: No   Drug use: No   Sexual activity: Yes    Comment: approx [redacted] wks gestation  Other Topics Concern   Not on file  Social History Narrative   Married.   2 children.   Works as a Paramedic.   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   Social Determinants of Health   Financial Resource Strain: Low Risk  (03/16/2019)   Overall Financial Resource Strain (CARDIA)    Difficulty of Paying Living Expenses: Not hard at all  Food Insecurity: No Food Insecurity (09/14/2022)   Hunger Vital Sign    Worried About Running Out of Food in the Last Year: Never true    Ran Out of Food in the Last Year: Never true  Transportation Needs: No Transportation Needs (09/14/2022)   PRAPARE - Administrator, Civil Service (Medical): No    Lack of Transportation (Non-Medical): No  Physical Activity: Not on file  Stress: No Stress Concern Present (03/16/2019)   [employer] of Occupational Health - Occupational Stress Questionnaire  Feeling of Stress : Not at all  Social Connections: Not on file  Intimate Partner Violence: Not At Risk (09/14/2022)   Humiliation, Afraid, Rape, and Kick questionnaire    Fear of Current or Ex-Partner: No    Emotionally Abused: No    Physically Abused: No    Sexually Abused: No    Past Surgical History:  Procedure Laterality Date   CESAREAN SECTION  04/01/2011   Procedure: CESAREAN SECTION;  Surgeon: Oliver Pila;  Location: [facility];  Service: Gynecology;  Laterality: N/A;   CESAREAN SECTION N/A 05/23/2013   Procedure: CESAREAN SECTION;  Surgeon: Lavina Hamman, MD;  Location: [facility];  Service: Obstetrics;  Laterality: N/A;   CESAREAN SECTION N/A 03/27/2019   Procedure: CESAREAN SECTION;  Surgeon: Huel Cote, MD;  Location: [facility];  Service: Obstetrics;  Laterality: N/AHerbert Seta,  RNFA   CHOLECYSTECTOMY N/A 08/21/2019   Procedure: LAPAROSCOPIC CHOLECYSTECTOMY with IOC;  Surgeon: Manus Rudd, MD;  Location: [facility];  Service: General;  Laterality: N/A;   CYSTECTOMY Left    removed from underarm    DILATION AND EVACUATION N/A 02/01/2018   Procedure: DILATATION AND EVACUATION;  Surgeon: Huel Cote, MD;  Location: [facility];  Service: Gynecology;  Laterality: N/A;   TOOTH EXTRACTION     wisdom teeth ext    Family History  Adopted: Yes  Problem Relation Age of Onset   Other Other        Adopted    Allergies  Allergen Reactions   Azithromycin Nausea And Vomiting   Gatifloxacin Nausea And Vomiting   Tequin Nausea And Vomiting   Penicillins Hives, Rash and Other (See Comments)    Has patient had a PCN reaction causing immediate rash, facial/tongue/throat swelling, SOB or lightheadedness with hypotension: Unknown Has patient had a PCN reaction causing severe rash involving mucus membranes or skin necrosis: Unknown Has patient had a PCN reaction that required hospitalization: Unknown Has patient had a PCN reaction occurring within the last 10 years: No If all of the above answers are "NO", then may proceed with Cephalosporin use.     Current Outpatient Medications on File Prior to Visit  Medication Sig Dispense Refill   ENBREL SURECLICK 50 MG/ML injection Inject 50 mg into the skin once a week.     NIFEdipine (PROCARDIA-XL/NIFEDICAL-XL) 30 MG 24 hr tablet TAKE 1 TABLET BY MOUTH EVERY DAY FOR BLOOD PRESSURE 90 tablet 1   caffeine 200 MG TABS tablet Take 1 tablet (200 mg total) by mouth once for 1 dose. 1 tablet 0   caffeine 200 MG TABS tablet Take 0.5 tablets (100 mg total) by mouth once for 1 dose. 0.5 tablet 0   No current facility-administered  medications on file prior to visit.    BP 132/84   Pulse 84   Temp 97.9 F (36.6 C) (Temporal)   Ht 4\' 9"  (1.448 m)   Wt 174 lb (78.9 kg)   LMP 12/23/2022 (Approximate)   SpO2 97%   Breastfeeding No   BMI 37.65 kg/m  Objective:   Physical Exam HENT:     Right Ear: Tympanic membrane and ear canal normal.     Left Ear: Tympanic membrane and ear canal normal.     Nose: Nose normal.  Eyes:     Conjunctiva/sclera: Conjunctivae normal.     Pupils: Pupils are equal, round, and reactive to light.  Neck:     Thyroid: No thyromegaly.  Cardiovascular:     Rate and Rhythm:  Normal rate and regular rhythm.     Heart sounds: No murmur heard. Pulmonary:     Effort: Pulmonary effort is normal.     Breath sounds: Normal breath sounds. No rales.  Abdominal:     General: Bowel sounds are normal.     Palpations: Abdomen is soft.     Tenderness: There is no abdominal tenderness.  Musculoskeletal:        General: Normal range of motion.     Cervical back: Neck supple.  Lymphadenopathy:     Cervical: No cervical adenopathy.  Skin:    General: Skin is warm and dry.     Findings: No rash.  Neurological:     Mental Status: She is alert and oriented to person, place, and time.     Cranial Nerves: No cranial nerve deficit.     Deep Tendon Reflexes: Reflexes are normal and symmetric.  Psychiatric:        Mood and Affect: Mood normal.           Assessment & Plan:  Preventative health care Assessment & Plan: Immunizations UTD. Pap smear UTD.  Discussed the importance of a healthy diet and regular exercise in order for weight loss, and to reduce the risk of further co-morbidity.  Exam stable. Labs pending.  Follow up in 1 year for repeat physical.    Essential hypertension Assessment & Plan: Controlled.  Continue nifedipine XL 30 mg daily.  Orders: -     Comprehensive metabolic panel  Migraine without status migrainosus, not intractable, unspecified migraine  type Assessment & Plan: Controlled.  No concerns today.   Rheumatoid arthritis, involving unspecified site, unspecified whether rheumatoid factor present [facility] Assessment & Plan: Controlled.  Continue Enbrel 50 mg weekly. Continue prednisone PRN. Following with rheumatology.    Type 2 diabetes mellitus with hyperglycemia, without long-term current use of insulin (HCC) Assessment & Plan: Historically controlled.  Repeat A1C pending  Continue metformin XR 500 mg daily. Foot exam today. Urine microalbumin due and pending.  Follow up in 6 months.   Hyperlipidemia, unspecified hyperlipidemia type Assessment & Plan: Repeat lipid panel pending.  Discussed the importance of a healthy diet and regular exercise in order for weight loss, and to reduce the risk of further co-morbidity.   Orders: -     Lipid panel  Type 2 diabetes mellitus without complication, without long-term current use of insulin (HCC) -     Microalbumin / creatinine urine ratio -     metFORMIN HCl ER; TAKE 1 TABLET BY MOUTH DAILY WITH BREAKFAST FOR DIABETES  Dispense: 90 tablet; Refill: 1 -     Hemoglobin A1c        Doreene Nest, NP

## 2023-01-21 NOTE — Assessment & Plan Note (Addendum)
Historically controlled.  Repeat A1C pending  Continue metformin XR 500 mg daily. Foot exam today. Urine microalbumin due and pending.  Follow up in 6 months.

## 2023-01-21 NOTE — Patient Instructions (Signed)
Stop by the lab prior to leaving today. I will notify you of your results once received.   Please schedule a follow up visit for 6 months for a diabetes check.  It was a pleasure to see you today!   

## 2023-01-21 NOTE — Assessment & Plan Note (Signed)
Controlled. No concerns today. 

## 2023-01-21 NOTE — Assessment & Plan Note (Signed)
Controlled.  Continue nifedipine XL 30 mg daily.

## 2023-01-28 ENCOUNTER — Other Ambulatory Visit: Payer: Self-pay | Admitting: Primary Care

## 2023-01-28 ENCOUNTER — Telehealth: Payer: Self-pay | Admitting: Primary Care

## 2023-01-28 DIAGNOSIS — R7989 Other specified abnormal findings of blood chemistry: Secondary | ICD-10-CM

## 2023-01-28 DIAGNOSIS — E785 Hyperlipidemia, unspecified: Secondary | ICD-10-CM

## 2023-01-28 DIAGNOSIS — E1165 Type 2 diabetes mellitus with hyperglycemia: Secondary | ICD-10-CM

## 2023-01-28 NOTE — Telephone Encounter (Signed)
Patient called in returning a call she received. She stated that she seen the MyChart message regarding her lab results and she would like to go ahead with the Ultrasound. Thank you!

## 2023-01-28 NOTE — Telephone Encounter (Addendum)
Noted.  Orders placed for ultrasound of liver.  Please let her know that she should be receiving a call back within a few days regarding scheduling.

## 2023-01-28 NOTE — Telephone Encounter (Signed)
Patient has been notified

## 2023-03-02 ENCOUNTER — Ambulatory Visit
Admission: RE | Admit: 2023-03-02 | Discharge: 2023-03-02 | Disposition: A | Discharge status: Home / Self Care | Payer: 59 | Source: Ambulatory Visit | Attending: Primary Care | Admitting: Primary Care

## 2023-03-02 DIAGNOSIS — R7989 Other specified abnormal findings of blood chemistry: Secondary | ICD-10-CM

## 2023-03-02 DIAGNOSIS — E785 Hyperlipidemia, unspecified: Secondary | ICD-10-CM

## 2023-03-02 DIAGNOSIS — E1165 Type 2 diabetes mellitus with hyperglycemia: Secondary | ICD-10-CM

## 2023-03-10 ENCOUNTER — Encounter: Payer: Self-pay | Admitting: Primary Care

## 2023-04-20 ENCOUNTER — Telehealth: Payer: Self-pay | Admitting: Primary Care

## 2023-04-20 DIAGNOSIS — R7989 Other specified abnormal findings of blood chemistry: Secondary | ICD-10-CM

## 2023-04-20 NOTE — Telephone Encounter (Signed)
Patient contacted the office to schedule lab visit. Patient says back in may, she had an ultrasound done for her liver. Says she was told by someone at the office that [facility] D[facility] would want to recheck liver levels so that is why she wanted to make a lab appointment. I could not find orders for this and wanted to see if this is what Jae Dire wanted? Patient can be reached at mobile number if needed, thank you

## 2023-04-20 NOTE — Telephone Encounter (Signed)
Spoke to pt, scheduled labs for 04/28/23

## 2023-04-20 NOTE — Telephone Encounter (Signed)
Reviewed abdominal ultrasound result notes which reveals that we need to obtain repeat liver enzyme testing through blood work.  Lab only appointment is necessary.  Thanks!

## 2023-04-23 ENCOUNTER — Other Ambulatory Visit: Payer: Self-pay | Admitting: Primary Care

## 2023-04-23 DIAGNOSIS — E119 Type 2 diabetes mellitus without complications: Secondary | ICD-10-CM

## 2023-04-28 ENCOUNTER — Other Ambulatory Visit: Payer: 59

## 2023-04-28 DIAGNOSIS — R7989 Other specified abnormal findings of blood chemistry: Secondary | ICD-10-CM

## 2023-04-28 LAB — HEPATIC FUNCTION PANEL
ALT: 25 U/L (ref 0–35)
AST: 19 U/L (ref 0–37)
Albumin: 3.7 g/dL (ref 3.5–5.2)
Alkaline Phosphatase: 58 U/L (ref 39–117)
Bilirubin, Direct: 0.1 mg/dL (ref 0.0–0.3)
Total Bilirubin: 0.4 mg/dL (ref 0.2–1.2)
Total Protein: 7 g/dL (ref 6.0–8.3)

## 2023-04-30 ENCOUNTER — Other Ambulatory Visit: Payer: Self-pay | Admitting: Primary Care

## 2023-04-30 DIAGNOSIS — I10 Essential (primary) hypertension: Secondary | ICD-10-CM

## 2023-06-07 ENCOUNTER — Telehealth: Payer: Self-pay | Admitting: Primary Care

## 2023-06-07 NOTE — Telephone Encounter (Signed)
Pt called stating her new employer is requiring her to received a TB skin test. Can orders be submitted? Pt also states her employer is requiring proof of covid vaccines. Pt is asking if our office has records of the vaccine? Call back # [phone]

## 2023-06-07 NOTE — Telephone Encounter (Signed)
Yes, set up for PPD and provide her with vaccine records.

## 2023-06-07 NOTE — Telephone Encounter (Signed)
Ok to set up for PPD?

## 2023-06-08 NOTE — Telephone Encounter (Signed)
Scheduled PPD placement, need to provide patient with copy of covid vaccine at time of appt.

## 2023-06-29 ENCOUNTER — Ambulatory Visit (INDEPENDENT_AMBULATORY_CARE_PROVIDER_SITE_OTHER): Payer: 59

## 2023-06-29 DIAGNOSIS — Z111 Encounter for screening for respiratory tuberculosis: Secondary | ICD-10-CM | POA: Diagnosis not present

## 2023-06-29 NOTE — Progress Notes (Signed)
PPD Placement note Kelly Gregory, 37 y.o. female is here today for placement of PPD test Reason for PPD test: work Pt taken PPD test before: yes Verified in allergy area and with patient that they are not allergic to the products PPD is made of (Phenol or Tween). Yes Is patient taking any oral or IV steroid medication now or have they taken it in the last month? no Has the patient ever received the BCG vaccine?: no Has the patient been in recent contact with anyone known or suspected of having active TB disease?: no      Date of exposure (if applicable): NA      Name of person they were exposed to (if applicable): NA  O: Alert and oriented in NAD. P:  PPD placed on 06/29/2023.  Patient advised to return for reading within 48-72 hours.

## 2023-07-01 ENCOUNTER — Ambulatory Visit: Payer: 59

## 2023-07-01 LAB — TB SKIN TEST
Induration: 0 mm
TB Skin Test: NEGATIVE

## 2023-07-01 NOTE — Progress Notes (Signed)
PPD Reading Note  PPD read and results entered in Epic[employer].  Result: 0 mm induration.  Interpretation: negative  If test not read within 48-72 hours of initial placement, patient advised to repeat in other arm 1-3 weeks after this test.  Allergic reaction: no

## 2023-07-27 ENCOUNTER — Encounter: Payer: Self-pay | Admitting: Primary Care

## 2023-07-27 ENCOUNTER — Ambulatory Visit: Payer: 59 | Admitting: Primary Care

## 2023-07-27 VITALS — BP 110/72 | HR 95 | Temp 97.5°F | Ht <= 58 in | Wt 134.0 lb

## 2023-07-27 DIAGNOSIS — E1165 Type 2 diabetes mellitus with hyperglycemia: Secondary | ICD-10-CM

## 2023-07-27 LAB — POCT GLYCOSYLATED HEMOGLOBIN (HGB A1C): Hemoglobin A1C: 5.1 % (ref 4.0–5.6)

## 2023-07-27 NOTE — Progress Notes (Signed)
Subjective:    Patient ID: Kelly Gregory, female    DOB: [birth year], 37 y.o.   MRN: [id number]  HPI  Kelly Gregory is a very pleasant 37 y.o. female with a history of type 2 diabetes, hypertension, migraines, rheumatoid arthritis, hyperlipidemia who presents today for follow-up of diabetes.  Current medications include: Metformin ER 500 mg daily. She's been taking metformin infrequently some weeks.   She is checking her blood glucose 0 times daily.  Last A1C: 6.9 in May 2024, 5.1 today Last Eye Exam: Up-to-date Last Foot Exam: Up-to-date Pneumonia Vaccination: Completed last in 2017 Urine Microalbumin: Up-to-date Statin: None.  Intolerant.  Dietary changes since last visit: Improved diet with lean protein, fruit, whole grain. Cutting out processed sugars and fruits. Increased water intake. 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   Exercise: Walking  BP Readings from Last 3 Encounters:  07/27/23 110/72  01/21/23 132/84  09/18/22 130/77   Wt Readings from Last 3 Encounters:  07/27/23 134 lb (60.8 kg)  01/21/23 174 lb (78.9 kg)  09/17/22 159 lb (72.1 kg)     Review of Systems  Eyes:  Negative for visual disturbance.  Respiratory:  Negative for shortness of breath.   Cardiovascular:  Negative for chest pain.  Neurological:  Negative for numbness.         Past Medical History:  Diagnosis Date   Abnormal uterine bleeding 06/04/2022   Borderline diabetes    Cholelithiasis with acute cholecystitis 08/21/2019   Closed traumatic nondisplaced fracture of distal end of right fibula 11/26/2021   CSF leak 09/16/2022   DM (diabetes mellitus) (HCC)    Elevated blood pressure    no meds   Gestational diabetes    Hypertension    Mixed stress and urge urinary incontinence 12/06/2019   Occipital neuralgia of left side 09/16/2022   Rheumatoid arthritis (HCC)    Years ago, does not follow, no meds   S/P repeat low transverse C-section 03/27/2019   Seasonal allergies     Syncope    As a child, no problems during adulthood.    Social History   Socioeconomic History   Marital status: Married    Spouse name: Not on file   Number of children: Not on file   Years of education: Not on file   Highest education level: Not on file  Occupational History   Not on file  Tobacco Use   Smoking status: Never   Smokeless tobacco: Never  Vaping Use   Vaping status: Never Used  Substance and Sexual Activity   Alcohol use: No   Drug use: No   Sexual activity: Yes    Comment: approx [redacted] wks gestation  Other Topics Concern   Not on file  Social History Narrative   Married.   2 children.   Works as a Paramedic.   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   Social Determinants of Health   Financial Resource Strain: Low Risk  (03/16/2019)   Overall Financial Resource Strain (CARDIA)    Difficulty of Paying Living Expenses: Not hard at all  Food Insecurity: No Food Insecurity (09/14/2022)   Hunger Vital Sign    Worried About Running Out of Food in the Last Year: Never true    Ran Out of Food in the Last Year: Never true  Transportation Needs: No Transportation Needs (09/14/2022)   PRAPARE - Administrator, Civil Service (Medical): No    Lack of Transportation (Non-Medical): No  Physical Activity:  Not on file  Stress: No Stress Concern Present (03/16/2019)   [employer] of Occupational Health - Occupational Stress Questionnaire    Feeling of Stress : Not at all  Social Connections: Not on file  Intimate Partner Violence: Not At Risk (09/14/2022)   Humiliation, Afraid, Rape, and Kick questionnaire    Fear of Current or Ex-Partner: No    Emotionally Abused: No    Physically Abused: No    Sexually Abused: No    Past Surgical History:  Procedure Laterality Date   CESAREAN SECTION  04/01/2011   Procedure: CESAREAN SECTION;  Surgeon: Oliver Pila;  Location: [facility];  Service: Gynecology;  Laterality: N/A;   CESAREAN SECTION N/A 05/23/2013    Procedure: CESAREAN SECTION;  Surgeon: Lavina Hamman, MD;  Location: [facility];  Service: Obstetrics;  Laterality: N/A;   CESAREAN SECTION N/A 03/27/2019   Procedure: CESAREAN SECTION;  Surgeon: Huel Cote, MD;  Location: [facility];  Service: Obstetrics;  Laterality: N/AHerbert Seta,  RNFA   CHOLECYSTECTOMY N/A 08/21/2019   Procedure: LAPAROSCOPIC CHOLECYSTECTOMY with IOC;  Surgeon: Manus Rudd, MD;  Location: [facility];  Service: General;  Laterality: N/A;   CYSTECTOMY Left    removed from underarm    DILATION AND EVACUATION N/A 02/01/2018   Procedure: DILATATION AND EVACUATION;  Surgeon: Huel Cote, MD;  Location: [facility];  Service: Gynecology;  Laterality: N/A;   TOOTH EXTRACTION     wisdom teeth ext    Family History  Adopted: Yes  Problem Relation Age of Onset   Other Other        Adopted    Allergies  Allergen Reactions   Azithromycin Nausea And Vomiting   Gatifloxacin Nausea And Vomiting   Tequin Nausea And Vomiting   Penicillins Hives, Rash and Other (See Comments)    Has patient had a PCN reaction causing immediate rash, facial/tongue/throat swelling, SOB or lightheadedness with hypotension: Unknown Has patient had a PCN reaction causing severe rash involving mucus membranes or skin necrosis: Unknown Has patient had a PCN reaction that required hospitalization: Unknown Has patient had a PCN reaction occurring within the last 10 years: No If all of the above answers are "NO", then may proceed with Cephalosporin use.     Current Outpatient Medications on File Prior to Visit  Medication Sig Dispense Refill   ENBREL SURECLICK 50 MG/ML injection Inject 50 mg into the skin once a week.     NIFEdipine (PROCARDIA-XL/NIFEDICAL-XL) 30 MG 24 hr tablet TAKE 1 TABLET BY MOUTH EVERY DAY FOR BLOOD PRESSURE 90 tablet 1   caffeine 200 MG TABS tablet Take 1 tablet (200 mg total) by mouth once for 1 dose. 1 tablet 0   caffeine 200 MG TABS tablet Take 0.5 tablets (100 mg total) by  mouth once for 1 dose. 0.5 tablet 0   No current facility-administered medications on file prior to visit.    BP 110/72   Pulse 95   Temp (!) 97.5 F (36.4 C) (Temporal)   Ht 4\' 9"  (1.448 m)   Wt 134 lb (60.8 kg)   LMP 07/13/2023   SpO2 98%   BMI 29.00 kg/m  Objective:   Physical Exam Cardiovascular:     Rate and Rhythm: Normal rate and regular rhythm.  Pulmonary:     Effort: Pulmonary effort is normal.     Breath sounds: Normal breath sounds.  Musculoskeletal:     Cervical back: Neck supple.  Skin:    General: Skin is warm  and dry.  Neurological:     Mental Status: She is alert and oriented to person, place, and time.  Psychiatric:        Mood and Affect: Mood normal.           Assessment & Plan:  Type 2 diabetes mellitus with hyperglycemia, without long-term current use of insulin (HCC) Assessment & Plan: Significantly improved with A1C of 5.1 today!  Commended her on weight loss through an improved diet.  Stop metformin.   Follow-up in 6 months.  Orders: -     POCT glycosylated hemoglobin (Hb A1C)        Doreene Nest, NP

## 2023-07-27 NOTE — Patient Instructions (Signed)
Stop metformin for diabetes.  Keep up the great work with weight loss!  Schedule your physical for 6 months.  It was a pleasure to see you today!

## 2023-07-27 NOTE — Assessment & Plan Note (Signed)
Significantly improved with A1C of 5.1 today!  Commended her on weight loss through an improved diet.  Stop metformin.   Follow-up in 6 months.

## 2023-11-17 ENCOUNTER — Other Ambulatory Visit: Payer: Self-pay | Admitting: Primary Care

## 2023-11-17 DIAGNOSIS — I10 Essential (primary) hypertension: Secondary | ICD-10-CM

## 2024-01-25 ENCOUNTER — Encounter: Payer: 59 | Admitting: Primary Care

## 2024-02-24 ENCOUNTER — Other Ambulatory Visit: Payer: Self-pay | Admitting: Primary Care

## 2024-02-24 DIAGNOSIS — I10 Essential (primary) hypertension: Secondary | ICD-10-CM

## 2024-02-24 NOTE — Telephone Encounter (Signed)
Patient is overdue for CPE/follow up, this will be required prior to any further refills.  Please schedule, thank you!   

## 2024-02-24 NOTE — Telephone Encounter (Signed)
 lvm for pt to call office to schedule appt.

## 2024-02-25 NOTE — Telephone Encounter (Signed)
 Called  pt and schedule a appt for cpe

## 2024-03-01 ENCOUNTER — Encounter: Admitting: Primary Care

## 2024-06-27 ENCOUNTER — Ambulatory Visit
Admission: EM | Admit: 2024-06-27 | Discharge: 2024-06-27 | Disposition: A | Discharge status: Home / Self Care | Attending: Emergency Medicine | Admitting: Emergency Medicine

## 2024-06-27 DIAGNOSIS — L509 Urticaria, unspecified: Secondary | ICD-10-CM

## 2024-06-27 MED ORDER — CETIRIZINE HCL 10 MG PO TABS: 10.0000 mg | ORAL_TABLET | Freq: Every day | ORAL | 0 refills | Status: DC

## 2024-06-27 MED ORDER — PREDNISONE 10 MG (21) PO TBPK: ORAL_TABLET | Freq: Every day | ORAL | 0 refills | Status: DC

## 2024-06-27 NOTE — Discharge Instructions (Addendum)
 Take prednisone  and Zyrtec as directed.    Follow up with your primary care provider.   Go to the emergency department if you have worsening symptoms.

## 2024-06-27 NOTE — ED Provider Notes (Signed)
 [facility]AL[facility] PELT    CSN: [id number] Arrival date & time: 06/27/24  1326      History   Chief Complaint Chief Complaint  Patient presents with   Rash    HPI Kelly Gregory is a 38 y.o. female.  Patient presents with 3-day history of pruritic rash on her upper arms and upper back.  No difficulty swallowing or breathing.  No new products, foods, medications.  No OTC medications taken for her symptoms.  No fever, sore throat, cough, shortness of breath.  The history is provided by the patient and medical records.    Past Medical History:  Diagnosis Date   Abnormal uterine bleeding 06/04/2022   Borderline diabetes    Cholelithiasis with acute cholecystitis 08/21/2019   Closed traumatic nondisplaced fracture of distal end of right fibula 11/26/2021   CSF leak 09/16/2022   DM (diabetes mellitus) (HCC)    Elevated blood pressure    no meds   Gestational diabetes    Hypertension    Mixed stress and urge urinary incontinence 12/06/2019   Occipital neuralgia of left side 09/16/2022   Rheumatoid arthritis (HCC)    Years ago, does not follow, no meds   S/P repeat low transverse C-section 03/27/2019   Seasonal allergies    Syncope    As a child, no problems during adulthood.    Patient Active Problem List   Diagnosis Date Noted   Fatigue 06/04/2022   Rheumatoid arthritis (HCC) 07/17/2019   Migraines 11/26/2016   Preventative health care 10/04/2015   Hyperlipemia 10/04/2015   Essential hypertension 08/23/2015   Type 2 diabetes mellitus with hyperglycemia (HCC) 05/25/2013    Past Surgical History:  Procedure Laterality Date   CESAREAN SECTION  04/01/2011   Procedure: CESAREAN SECTION;  Surgeon: Nathanel LELON Bunker;  Location: [facility];  Service: Gynecology;  Laterality: N/A;   CESAREAN SECTION N/A 05/23/2013   Procedure: CESAREAN SECTION;  Surgeon: Krystal Deaner, MD;  Location: [facility];  Service: Obstetrics;  Laterality: N/A;   CESAREAN SECTION N/A 03/27/2019   Procedure:  CESAREAN SECTION;  Surgeon: Bunker Nathanel, MD;  Location: [facility];  Service: Obstetrics;  Laterality: N/AMERL Moats,  RNFA   CHOLECYSTECTOMY N/A 08/21/2019   Procedure: LAPAROSCOPIC CHOLECYSTECTOMY with IOC;  Surgeon: Belinda Cough, MD;  Location: [facility];  Service: General;  Laterality: N/A;   CYSTECTOMY Left    removed from underarm    DILATION AND EVACUATION N/A 02/01/2018   Procedure: DILATATION AND EVACUATION;  Surgeon: Bunker Nathanel, MD;  Location: [facility];  Service: Gynecology;  Laterality: N/A;   TOOTH EXTRACTION     wisdom teeth ext    OB History     Gravida  5   Para  2   Term  2   Preterm      AB  2   Living  3      SAB  2   IAB      Ectopic      Multiple  0   Live Births  2            Home Medications    Prior to Admission medications   Medication Sig Start Date End Date Taking? Authorizing Provider  cetirizine (ZYRTEC ALLERGY) 10 MG tablet Take 1 tablet (10 mg total) by mouth daily for 14 days. 06/27/24 07/11/24 Yes Corlis Burnard DEL, NP  ENBREL SURECLICK 50 MG/ML injection Inject 50 mg into the skin once a week.   Yes [provider]  NIFEdipine  (PROCARDIA -XL/NIFEDICAL-XL) 30 MG 24 hr tablet TAKE 1 TABLET BY MOUTH EVERY DAY FOR BLOOD PRESSURE 02/24/24  Yes Gretta Comer POUR, NP  predniSONE  (STERAPRED UNI-PAK 21 TAB) 10 MG (21) TBPK tablet Take by mouth daily. As directed 06/27/24  Yes Corlis Burnard DEL, NP  caffeine  200 MG TABS tablet Take 1 tablet (200 mg total) by mouth once for 1 dose. Patient not taking: Reported on 06/27/2024 09/17/22 09/17/22  Jillian Buttery, MD  caffeine  200 MG TABS tablet Take 0.5 tablets (100 mg total) by mouth once for 1 dose. Patient not taking: Reported on 06/27/2024 09/18/22 09/18/22  Jillian Buttery, MD    Family History Family History  Adopted: Yes  Problem Relation Age of Onset   Other Other        Adopted    Social History Social History   Tobacco Use   Smoking status: Never   Smokeless tobacco:  Never  Vaping Use   Vaping status: Never Used  Substance Use Topics   Alcohol use: No   Drug use: No     Allergies   Azithromycin, Gatifloxacin, Tequin, and Penicillins   Review of Systems Review of Systems  Constitutional:  Negative for chills and fever.  HENT:  Negative for sore throat, trouble swallowing and voice change.   Respiratory:  Negative for cough and shortness of breath.   Skin:  Positive for color change and rash.     Physical Exam Triage Vital Signs ED Triage Vitals  Encounter Vitals Group     BP 06/27/24 1339 137/87     Girls Systolic BP Percentile --      Girls Diastolic BP Percentile --      Boys Systolic BP Percentile --      Boys Diastolic BP Percentile --      Pulse Rate 06/27/24 1339 90     Resp 06/27/24 1339 18     Temp 06/27/24 1339 98 F (36.7 C)     Temp src --      SpO2 06/27/24 1339 98 %     Weight --      Height --      Head Circumference --      Peak Flow --      Pain Score 06/27/24 1336 0     Pain Loc --      Pain Education --      Exclude from Growth Chart --    No data found.  Updated Vital Signs BP 137/87   Pulse 90   Temp 98 F (36.7 C)   Resp 18   LMP 06/07/2024   SpO2 98%   Visual Acuity Right Eye Distance:   Left Eye Distance:   Bilateral Distance:    Right Eye Near:   Left Eye Near:    Bilateral Near:     Physical Exam Constitutional:      General: She is not in acute distress. HENT:     Mouth/Throat:     Mouth: Mucous membranes are moist.     Pharynx: Oropharynx is clear.  Cardiovascular:     Rate and Rhythm: Normal rate and regular rhythm.     Heart sounds: Normal heart sounds.  Pulmonary:     Effort: Pulmonary effort is normal. No respiratory distress.     Breath sounds: Normal breath sounds.  Skin:    General: Skin is warm and dry.     Findings: Rash present.     Comments: Hives on both of her arms and  few on both sides of upper back.  No wounds or drainage.  Neurological:     Mental Status:  She is alert.      UC Treatments / Results  Labs (all labs ordered are listed, but only abnormal results are displayed) Labs Reviewed - No data to display  EKG   Radiology No results found.  Procedures Procedures (including critical care time)  Medications Ordered in UC Medications - No data to display  Initial Impression / Assessment and Plan / UC Course  I have reviewed the triage vital signs and the nursing notes.  Pertinent labs & imaging results that were available during my care of the patient were reviewed by me and considered in my medical decision making (see chart for details).    Hives.  Afebrile and vital signs are stable.  No respiratory distress.  O2 sat 98% on room air.  Patient has a few hives on both upper arms and her upper back.  No known cause.  Treating today with prednisone  and Zyrtec.  Instructed her to follow-up with her PCP.  ED precautions given.  Education provided on hives.  She agrees to plan of care.  Final Clinical Impressions(s) / UC Diagnoses   Final diagnoses:  Hives     Discharge Instructions      Take prednisone  and Zyrtec as directed.    Follow up with your primary care provider.   Go to the emergency department if you have worsening symptoms.        ED Prescriptions     Medication Sig Dispense Auth. Provider   predniSONE  (STERAPRED UNI-PAK 21 TAB) 10 MG (21) TBPK tablet Take by mouth daily. As directed 21 tablet Corlis Burnard DEL, NP   cetirizine (ZYRTEC ALLERGY) 10 MG tablet Take 1 tablet (10 mg total) by mouth daily for 14 days. 14 tablet Corlis Burnard DEL, NP      PDMP not reviewed this encounter.   Corlis Burnard DEL, NP 06/27/24 [phone]

## 2024-06-27 NOTE — ED Triage Notes (Signed)
 Patient to Urgent Care with complaints of an itchy rash to to bilateral upper arms.   Symptoms started 3 days ago.   No otc meds.

## 2024-09-20 ENCOUNTER — Ambulatory Visit: Admitting: Primary Care

## 2024-09-20 ENCOUNTER — Encounter: Payer: Self-pay | Admitting: Primary Care

## 2024-09-20 VITALS — BP 130/80 | HR 88 | Temp 98.0°F | Ht <= 58 in | Wt 158.2 lb

## 2024-09-20 DIAGNOSIS — Z0001 Encounter for general adult medical examination with abnormal findings: Secondary | ICD-10-CM

## 2024-09-20 DIAGNOSIS — E1165 Type 2 diabetes mellitus with hyperglycemia: Secondary | ICD-10-CM | POA: Diagnosis not present

## 2024-09-20 DIAGNOSIS — M069 Rheumatoid arthritis, unspecified: Secondary | ICD-10-CM | POA: Diagnosis not present

## 2024-09-20 DIAGNOSIS — L509 Urticaria, unspecified: Secondary | ICD-10-CM

## 2024-09-20 DIAGNOSIS — I10 Essential (primary) hypertension: Secondary | ICD-10-CM | POA: Diagnosis not present

## 2024-09-20 DIAGNOSIS — Z1231 Encounter for screening mammogram for malignant neoplasm of breast: Secondary | ICD-10-CM

## 2024-09-20 DIAGNOSIS — E785 Hyperlipidemia, unspecified: Secondary | ICD-10-CM

## 2024-09-20 LAB — COMPREHENSIVE METABOLIC PANEL WITH GFR
ALT: 23 U/L (ref 3–35)
AST: 19 U/L (ref 5–37)
Albumin: 3.6 g/dL (ref 3.5–5.2)
Alkaline Phosphatase: 67 U/L (ref 39–117)
BUN: 12 mg/dL (ref 6–23)
CO2: 23 meq/L (ref 19–32)
Calcium: 8.6 mg/dL (ref 8.4–10.5)
Chloride: 104 meq/L (ref 96–112)
Creatinine, Ser: 0.59 mg/dL (ref 0.40–1.20)
GFR: 112.54 mL/min
Glucose, Bld: 130 mg/dL — ABNORMAL HIGH (ref 70–99)
Potassium: 3.6 meq/L (ref 3.5–5.1)
Sodium: 134 meq/L — ABNORMAL LOW (ref 135–145)
Total Bilirubin: 0.4 mg/dL (ref 0.2–1.2)
Total Protein: 7.5 g/dL (ref 6.0–8.3)

## 2024-09-20 LAB — LIPID PANEL
Cholesterol: 249 mg/dL — ABNORMAL HIGH (ref 28–200)
HDL: 43.5 mg/dL
LDL Cholesterol: 178 mg/dL — ABNORMAL HIGH (ref 10–99)
NonHDL: 205.81
Total CHOL/HDL Ratio: 6
Triglycerides: 137 mg/dL (ref 10.0–149.0)
VLDL: 27.4 mg/dL (ref 0.0–40.0)

## 2024-09-20 LAB — MICROALBUMIN / CREATININE URINE RATIO
Creatinine,U: 88 mg/dL
Microalb Creat Ratio: 10 mg/g (ref 0.0–30.0)
Microalb, Ur: 0.9 mg/dL (ref 0.7–1.9)

## 2024-09-20 LAB — HEMOGLOBIN A1C: Hgb A1c MFr Bld: 6.4 % (ref 4.6–6.5)

## 2024-09-20 MED ORDER — EPINEPHRINE 0.3 MG/0.3ML IJ SOAJ: 0.3000 mg | INTRAMUSCULAR | 0 refills | Status: AC | PRN

## 2024-09-20 NOTE — Assessment & Plan Note (Signed)
 Immunizations UTD. She will discuss HPV vaccine series with her mother as she is unsure. Pap smear UTD.  Follows with GYN Mammogram due, orders placed.  Discussed the importance of a healthy diet and regular exercise in order for weight loss, and to reduce the risk of further co-morbidity.  Exam stable. Labs pending.  Follow up in 1 year for repeat physical.

## 2024-09-20 NOTE — Patient Instructions (Signed)
 Stop by the lab prior to leaving today. I will notify you of your results once received.   Use the EpiPen  only in emergencies as discussed.  [facility].   Please schedule a follow up visit for 6 months for a diabetes check.  It was a pleasure to see you today!

## 2024-09-20 NOTE — Assessment & Plan Note (Signed)
 Repeat lipid panel pending.

## 2024-09-20 NOTE — Assessment & Plan Note (Signed)
 Repeat A1c pending  Remain off treatment for now.  Follow-up in 3 to 6 months based on A1c result.

## 2024-09-20 NOTE — Assessment & Plan Note (Signed)
 Controlled.  Continue Enbrel 50 mg once weekly.

## 2024-09-20 NOTE — Progress Notes (Signed)
 "  Subjective:    Patient ID: Kelly Gregory, female    DOB: 10/07/1985, 39 y.o.   MRN: [id number]  Kelly Gregory is a very pleasant 39 y.o. female who presents today for complete physical and follow up of chronic conditions.  She would also like to discuss hives. History of two separate cases of full body hives. The initial case was in October 2025, treated at urgent care with prednisone  course and Zyrtec  and symptoms resolved. Around Thanksgiving she experienced the same rash to most of her body with shortness of breath.  She initially changed detergents prior to first incident, but has since changed back. She denies tick bites, changes in medications or supplements OTC.  Prior to each symptom onset she ate a [employer], the first time was a chicken wrap, the second time was a grilled chicken sandwich.  She has since eaten chicken without any problems. No rash since Thanksgiving.   Immunizations: -Tetanus: Completed in 2020 -Influenza: Completed this season  -HPV: Unsure  Diet: Fair diet.  Exercise: No regular exercise.  Eye exam: Completes annually  Dental exam: Completed > 1 year ago    Pap Smear: Completes per GYN Mammogram: Never completed    BP Readings from Last 3 Encounters:  09/20/24 130/80  06/27/24 137/87  07/27/23 110/72   Wt Readings from Last 3 Encounters:  09/20/24 158 lb 4 oz (71.8 kg)  07/27/23 134 lb (60.8 kg)  01/21/23 174 lb (78.9 kg)      Review of Systems  Constitutional:  Negative for unexpected weight change.  HENT:  Negative for rhinorrhea.   Respiratory:  Negative for cough and shortness of breath.   Cardiovascular:  Negative for chest pain.  Gastrointestinal:  Negative for constipation and diarrhea.  Genitourinary:  Negative for difficulty urinating and menstrual problem.  Musculoskeletal:  Negative for arthralgias and myalgias.  Skin:  Positive for rash.  Allergic/Immunologic: Negative for environmental allergies.  Neurological:  Negative for  dizziness and headaches.  Psychiatric/Behavioral:  The patient is not nervous/anxious.          Past Medical History:  Diagnosis Date   Abnormal uterine bleeding 06/04/2022   Borderline diabetes    Cholelithiasis with acute cholecystitis 08/21/2019   Closed traumatic nondisplaced fracture of distal end of right fibula 11/26/2021   CSF leak 09/16/2022   DM (diabetes mellitus) (HCC)    Elevated blood pressure    no meds   Gestational diabetes    Hypertension    Mixed stress and urge urinary incontinence 12/06/2019   Occipital neuralgia of left side 09/16/2022   Rheumatoid arthritis (HCC)    Years ago, does not follow, no meds   S/P repeat low transverse C-section 03/27/2019   Seasonal allergies    Syncope    As a child, no problems during adulthood.    Social History   Socioeconomic History   Marital status: Married    Spouse name: Not on file   Number of children: Not on file   Years of education: Not on file   Highest education level: Not on file  Occupational History   Not on file  Tobacco Use   Smoking status: Never   Smokeless tobacco: Never  Vaping Use   Vaping status: Never Used  Substance and Sexual Activity   Alcohol use: No   Drug use: No   Sexual activity: Yes    Comment: approx [redacted] wks gestation  Other Topics Concern   Not on file  Social History  Narrative   Married.   2 children.   Works as a paramedic.   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   Social Drivers of Health   Tobacco Use: Low Risk (09/20/2024)   Patient History    Smoking Tobacco Use: Never    Smokeless Tobacco Use: Never    Passive Exposure: Not on file  Financial Resource Strain: Not on file  Food Insecurity: No Food Insecurity (09/14/2022)   Hunger Vital Sign    Worried About Running Out of Food in the Last Year: Never true    Ran Out of Food in the Last Year: Never true  Transportation Needs: No Transportation Needs (09/14/2022)   PRAPARE - Scientist, Research (physical Sciences) (Medical): No    Lack of Transportation (Non-Medical): No  Physical Activity: Not on file  Stress: Not on file  Social Connections: Not on file  Intimate Partner Violence: Not At Risk (09/14/2022)   Humiliation, Afraid, Rape, and Kick questionnaire    Fear of Current or Ex-Partner: No    Emotionally Abused: No    Physically Abused: No    Sexually Abused: No  Depression (PHQ2-9): Low Risk (09/20/2024)   Depression (PHQ2-9)    PHQ-2 Score: 0  Alcohol Screen: Not on file  Housing: Low Risk (09/14/2022)   Housing    Last Housing Risk Score: 0  Utilities: Not At Risk (09/14/2022)   [facility] Utilities    Threatened with loss of utilities: No  Health Literacy: Not on file    Past Surgical History:  Procedure Laterality Date   CESAREAN SECTION  04/01/2011   Procedure: CESAREAN SECTION;  Surgeon: Nathanel LELON Bunker;  Location: [facility];  Service: Gynecology;  Laterality: N/A;   CESAREAN SECTION N/A 05/23/2013   Procedure: CESAREAN SECTION;  Surgeon: Krystal Deaner, MD;  Location: [facility];  Service: Obstetrics;  Laterality: N/A;   CESAREAN SECTION N/A 03/27/2019   Procedure: CESAREAN SECTION;  Surgeon: Bunker Nathanel, MD;  Location: [facility];  Service: Obstetrics;  Laterality: N/AMERL Moats,  RNFA   CHOLECYSTECTOMY N/A 08/21/2019   Procedure: LAPAROSCOPIC CHOLECYSTECTOMY with IOC;  Surgeon: Belinda Cough, MD;  Location: [facility];  Service: General;  Laterality: N/A;   CYSTECTOMY Left    removed from underarm    DILATION AND EVACUATION N/A 02/01/2018   Procedure: DILATATION AND EVACUATION;  Surgeon: Bunker Nathanel, MD;  Location: [facility];  Service: Gynecology;  Laterality: N/A;   TOOTH EXTRACTION     wisdom teeth ext    Family History  Adopted: Yes  Problem Relation Age of Onset   Other Other        Adopted    Allergies[1]  Medications Ordered Prior to Encounter[2]  BP 130/80   Pulse 88   Temp 98 F (36.7 C) (Oral)   Ht 4' 8.5 (1.435 m)   Wt 158 lb 4 oz (71.8 kg)   LMP  09/20/2024   SpO2 99%   BMI 34.85 kg/m  Objective:   Physical Exam HENT:     Right Ear: Tympanic membrane and ear canal normal.     Left Ear: Tympanic membrane and ear canal normal.  Eyes:     Pupils: Pupils are equal, round, and reactive to light.  Cardiovascular:     Rate and Rhythm: Normal rate and regular rhythm.  Pulmonary:     Effort: Pulmonary effort is normal.     Breath sounds: Normal breath sounds.  Abdominal:     General: Bowel sounds  are normal.     Palpations: Abdomen is soft.     Tenderness: There is no abdominal tenderness.  Musculoskeletal:        General: Normal range of motion.     Cervical back: Neck supple.  Skin:    General: Skin is warm and dry.  Neurological:     Mental Status: She is alert and oriented to person, place, and time.     Cranial Nerves: No cranial nerve deficit.     Deep Tendon Reflexes:     Reflex Scores:      Patellar reflexes are 2+ on the right side and 2+ on the left side. Psychiatric:        Mood and Affect: Mood normal.     Physical Exam        Assessment & Plan:  Encounter for annual general medical examination with abnormal findings in adult Assessment & Plan: Immunizations UTD. She will discuss HPV vaccine series with her mother as she is unsure. Pap smear UTD.  Follows with GYN Mammogram due, orders placed.  Discussed the importance of a healthy diet and regular exercise in order for weight loss, and to reduce the risk of further co-morbidity.  Exam stable. Labs pending.  Follow up in 1 year for repeat physical.    Essential hypertension Assessment & Plan: Controlled.  Continue nifedipine  30 mg daily  Orders: -     Comprehensive metabolic panel with GFR  Type 2 diabetes mellitus with hyperglycemia, without long-term current use of insulin  (HCC) Assessment & Plan: Repeat A1c pending  Remain off treatment for now.  Follow-up in 3 to 6 months based on A1c result.  Orders: -     Microalbumin /  creatinine urine ratio -     Hemoglobin A1c  Rheumatoid arthritis, involving unspecified site, unspecified whether rheumatoid factor present [facility]) Assessment & Plan: Controlled.  Continue Enbrel 50 mg once weekly.   Hyperlipidemia, unspecified hyperlipidemia type Assessment & Plan: Repeat lipid panel pending    Orders: -     Lipid panel  Hives Assessment & Plan: Unclear etiology.  Will check food allergy panel, alpha gal panel, ANA. Prescription for EpiPen  provided, discussed instructions for use and when to use.  Consider allergist referral, await lab results.  Orders: -     Food Allergy Profile -     Alpha-Gal Panel -     ANA -     EPINEPHrine ; Inject 0.3 mg into the muscle as needed.  Dispense: 1 each; Refill: 0  Screening mammogram for breast cancer -     3D Screening Mammogram, Left and Right; Future    Assessment and Plan Assessment & Plan         Comer MARLA Gaskins, NP       [1]  Allergies Allergen Reactions   Azithromycin Nausea And Vomiting   Gatifloxacin Nausea And Vomiting   Tequin Nausea And Vomiting   Penicillins Hives, Rash and Other (See Comments)    Has patient had a PCN reaction causing immediate rash, facial/tongue/throat swelling, SOB or lightheadedness with hypotension: Unknown Has patient had a PCN reaction causing severe rash involving mucus membranes or skin necrosis: Unknown Has patient had a PCN reaction that required hospitalization: Unknown Has patient had a PCN reaction occurring within the last 10 years: No If all of the above answers are NO, then may proceed with Cephalosporin use.   [2]  Current Outpatient Medications on File Prior to Visit  Medication Sig Dispense Refill  ENBREL SURECLICK 50 MG/ML injection Inject 50 mg into the skin once a week.     NIFEdipine  (PROCARDIA -XL/NIFEDICAL-XL) 30 MG 24 hr tablet TAKE 1 TABLET BY MOUTH EVERY DAY FOR BLOOD PRESSURE 90 tablet 0   No current facility-administered  medications on file prior to visit.   "

## 2024-09-20 NOTE — Assessment & Plan Note (Signed)
 Unclear etiology.  Will check food allergy panel, alpha gal panel, ANA. Prescription for EpiPen  provided, discussed instructions for use and when to use.  Consider allergist referral, await lab results.

## 2024-09-20 NOTE — Assessment & Plan Note (Signed)
Controlled.  Continue nifedipine 30 mg daily. 

## 2024-09-21 ENCOUNTER — Ambulatory Visit: Payer: Self-pay | Admitting: Primary Care

## 2024-09-21 DIAGNOSIS — E1165 Type 2 diabetes mellitus with hyperglycemia: Secondary | ICD-10-CM

## 2024-09-21 DIAGNOSIS — E785 Hyperlipidemia, unspecified: Secondary | ICD-10-CM

## 2024-09-22 MED ORDER — PRAVASTATIN SODIUM 40 MG PO TABS: 40.0000 mg | ORAL_TABLET | Freq: Every day | ORAL | 3 refills | Status: AC

## 2024-09-22 MED ORDER — OZEMPIC (0.25 OR 0.5 MG/DOSE) 2 MG/3ML ~~LOC~~ SOPN: PEN_INJECTOR | SUBCUTANEOUS | 0 refills | Status: AC

## 2024-09-25 LAB — FOOD ALLERGY PROFILE
"Macadamia Nut ": 0.42 kU/L — ABNORMAL HIGH
"Sesame Seed f10 ": 0.46 kU/L — ABNORMAL HIGH
Allergen, Salmon, f41: <0.1 kU/L
Almonds: 0.43 kU/L — ABNORMAL HIGH
Brazil Nut: 0.13 kU/L — ABNORMAL HIGH
CLASS: 0
CLASS: 0
CLASS: 0
CLASS: 1
CLASS: 1
CLASS: 1
CLASS: 1
CLASS: 1
CLASS: 1
Cashew IgE: 0.26 kU/L — ABNORMAL HIGH
Class: 0
Class: 0
Class: 1
Class: 2
Egg White IgE: <0.1 kU/L
Fish Cod: <0.1 kU/L
Hazelnut: 0.35 kU/L — ABNORMAL HIGH
Milk IgE: <0.1 kU/L
Peanut IgE: 0.46 kU/L — ABNORMAL HIGH
Scallop IgE: 0.32 kU/L — ABNORMAL HIGH
Shrimp IgE: 2.12 kU/L — ABNORMAL HIGH
Soybean IgE: 0.31 kU/L — ABNORMAL HIGH
Tuna IgE: <0.1 kU/L
Walnut: 0.37 kU/L — ABNORMAL HIGH
Wheat IgE: 0.56 kU/L — ABNORMAL HIGH

## 2024-09-25 LAB — ALPHA-GAL PANEL
Allergen, Mutton, f88: <0.1 kU/L
Allergen, Pork, f26: <0.1 kU/L
Beef: <0.1 kU/L
CLASS: 0
CLASS: 0
Class: 0
GALACTOSE-ALPHA-1,3-GALACTOSE IGE*: <0.1 kU/L

## 2024-09-25 LAB — MISC HAZELNUT COMP PNL
Cor a1(f428): <0.1 kU/L
Cor a14(f439): <0.1 kU/L
Cor a8(f425): <0.1 kU/L
Cor a9(f440): 0.2 kU/L — ABNORMAL HIGH

## 2024-09-25 LAB — MISC BRAZIL NUT: Brazil Nut Component: <0.1 kU/L

## 2024-09-25 LAB — PEANUT COMPONENT PANEL REFLEX
Ara h 1 (f422): <0.1 kU/L
Ara h 2 (f423): <0.1 kU/L
Ara h 3 (f424): <0.1 kU/L
Ara h 8 (f352): <0.1 kU/L
Ara h 9 (f427: <0.1 kU/L
F447-IgE Ara h 6: <0.1 kU/L

## 2024-09-25 LAB — MISC CASHEW NUT: ANA O 3 IgE: <0.1 kU/L

## 2024-09-25 LAB — MISC WALNUT COMP PNL
MISCELLANEOUS: <0.1 kU/L
rJug r3 (f442): <0.1 kU/L

## 2024-09-25 LAB — ANA: Anti Nuclear Antibody (ANA): POSITIVE — AB

## 2024-09-25 LAB — ANTI-NUCLEAR AB-TITER (ANA TITER): ANA Titer 1: 1:80 {titer} — ABNORMAL HIGH

## 2024-09-25 LAB — INTERPRETATION:

## 2024-10-05 ENCOUNTER — Ambulatory Visit
Admission: RE | Admit: 2024-10-05 | Discharge: 2024-10-05 | Disposition: A | Discharge status: Home / Self Care | Source: Ambulatory Visit | Attending: Primary Care | Admitting: Primary Care

## 2024-10-05 DIAGNOSIS — Z1231 Encounter for screening mammogram for malignant neoplasm of breast: Secondary | ICD-10-CM | POA: Diagnosis present

## 2024-10-09 ENCOUNTER — Encounter

## 2025-03-20 ENCOUNTER — Ambulatory Visit: Admitting: Primary Care
